# Patient Record
Sex: Female | Born: 1960 | Race: White | Hispanic: No | Marital: Married | Smoking: Former smoker
Health system: Southern US, Community
[De-identification: ages and names within clinical notes are randomized; demographics above are authoritative.]

## PROBLEM LIST (undated history)

## (undated) DIAGNOSIS — I341 Nonrheumatic mitral (valve) prolapse: Secondary | ICD-10-CM

## (undated) DIAGNOSIS — R112 Nausea with vomiting, unspecified: Secondary | ICD-10-CM

## (undated) DIAGNOSIS — R51 Headache: Secondary | ICD-10-CM

## (undated) DIAGNOSIS — M778 Other enthesopathies, not elsewhere classified: Secondary | ICD-10-CM

## (undated) DIAGNOSIS — Z9189 Other specified personal risk factors, not elsewhere classified: Secondary | ICD-10-CM

## (undated) DIAGNOSIS — K635 Polyp of colon: Secondary | ICD-10-CM

## (undated) DIAGNOSIS — L609 Nail disorder, unspecified: Secondary | ICD-10-CM

## (undated) DIAGNOSIS — D122 Benign neoplasm of ascending colon: Secondary | ICD-10-CM

## (undated) DIAGNOSIS — H9319 Tinnitus, unspecified ear: Secondary | ICD-10-CM

## (undated) DIAGNOSIS — Z7183 Encounter for nonprocreative genetic counseling: Secondary | ICD-10-CM

## (undated) DIAGNOSIS — L719 Rosacea, unspecified: Secondary | ICD-10-CM

## (undated) DIAGNOSIS — J439 Emphysema, unspecified: Secondary | ICD-10-CM

## (undated) DIAGNOSIS — Z1211 Encounter for screening for malignant neoplasm of colon: Secondary | ICD-10-CM

## (undated) DIAGNOSIS — J449 Chronic obstructive pulmonary disease, unspecified: Secondary | ICD-10-CM

## (undated) DIAGNOSIS — Z803 Family history of malignant neoplasm of breast: Secondary | ICD-10-CM

## (undated) DIAGNOSIS — R519 Headache, unspecified: Secondary | ICD-10-CM

## (undated) DIAGNOSIS — Z1371 Encounter for nonprocreative screening for genetic disease carrier status: Secondary | ICD-10-CM

## (undated) DIAGNOSIS — Z9889 Other specified postprocedural states: Secondary | ICD-10-CM

## (undated) DIAGNOSIS — R200 Anesthesia of skin: Secondary | ICD-10-CM

## (undated) HISTORY — DX: Encounter for screening for malignant neoplasm of colon: Z12.11

## (undated) HISTORY — DX: Nail disorder, unspecified: L60.9

## (undated) HISTORY — DX: Benign neoplasm of ascending colon: D12.2

## (undated) HISTORY — DX: Family history of malignant neoplasm of breast: Z80.3

## (undated) HISTORY — DX: Encounter for nonprocreative screening for genetic disease carrier status: Z13.71

## (undated) HISTORY — DX: Other enthesopathies, not elsewhere classified: M77.8

## (undated) HISTORY — PX: LUMBAR DISC SURGERY: SHX700

## (undated) HISTORY — DX: Polyp of colon: K63.5

## (undated) HISTORY — DX: Other specified personal risk factors, not elsewhere classified: Z91.89

## (undated) HISTORY — DX: Encounter for nonprocreative genetic counseling: Z71.83

## (undated) HISTORY — PX: BUNIONECTOMY: SHX129

---

## 2004-03-28 ENCOUNTER — Ambulatory Visit: Payer: Self-pay | Admitting: Podiatry

## 2005-09-21 ENCOUNTER — Emergency Department: Payer: Self-pay | Admitting: Emergency Medicine

## 2008-03-01 ENCOUNTER — Ambulatory Visit: Payer: Self-pay

## 2008-03-20 ENCOUNTER — Ambulatory Visit: Payer: Self-pay

## 2008-03-22 ENCOUNTER — Ambulatory Visit: Payer: Self-pay

## 2009-10-14 ENCOUNTER — Ambulatory Visit: Payer: Self-pay

## 2011-01-29 ENCOUNTER — Ambulatory Visit: Payer: Self-pay

## 2012-12-12 DIAGNOSIS — M79606 Pain in leg, unspecified: Secondary | ICD-10-CM | POA: Insufficient documentation

## 2012-12-12 DIAGNOSIS — I8390 Asymptomatic varicose veins of unspecified lower extremity: Secondary | ICD-10-CM | POA: Insufficient documentation

## 2014-06-19 ENCOUNTER — Ambulatory Visit: Payer: Self-pay | Admitting: Internal Medicine

## 2014-06-19 LAB — CBC AND DIFFERENTIAL: Hemoglobin: 14 g/dL (ref 12.0–16.0)

## 2014-06-19 LAB — TSH: TSH: 1.02 u[IU]/mL (ref <=5.90)

## 2014-06-19 LAB — BASIC METABOLIC PANEL
BUN: 13 mg/dL (ref 4–21)
Creatinine: 0.7 mg/dL (ref <=1.1)

## 2014-06-23 ENCOUNTER — Emergency Department: Payer: Self-pay | Admitting: Emergency Medicine

## 2015-03-11 ENCOUNTER — Encounter: Payer: Self-pay | Admitting: Internal Medicine

## 2015-03-11 ENCOUNTER — Other Ambulatory Visit: Payer: Self-pay | Admitting: Internal Medicine

## 2015-03-11 DIAGNOSIS — R599 Enlarged lymph nodes, unspecified: Secondary | ICD-10-CM | POA: Insufficient documentation

## 2015-03-11 DIAGNOSIS — L210 Seborrhea capitis: Secondary | ICD-10-CM | POA: Insufficient documentation

## 2015-03-11 DIAGNOSIS — F17201 Nicotine dependence, unspecified, in remission: Secondary | ICD-10-CM | POA: Insufficient documentation

## 2015-03-11 DIAGNOSIS — M778 Other enthesopathies, not elsewhere classified: Secondary | ICD-10-CM | POA: Insufficient documentation

## 2015-03-11 DIAGNOSIS — M62838 Other muscle spasm: Secondary | ICD-10-CM

## 2015-03-11 DIAGNOSIS — M25519 Pain in unspecified shoulder: Secondary | ICD-10-CM | POA: Insufficient documentation

## 2015-03-11 DIAGNOSIS — L609 Nail disorder, unspecified: Secondary | ICD-10-CM | POA: Insufficient documentation

## 2015-03-11 DIAGNOSIS — F17200 Nicotine dependence, unspecified, uncomplicated: Secondary | ICD-10-CM | POA: Insufficient documentation

## 2015-03-11 DIAGNOSIS — I8390 Asymptomatic varicose veins of unspecified lower extremity: Secondary | ICD-10-CM | POA: Insufficient documentation

## 2015-03-11 DIAGNOSIS — L719 Rosacea, unspecified: Secondary | ICD-10-CM | POA: Insufficient documentation

## 2015-03-11 DIAGNOSIS — R591 Generalized enlarged lymph nodes: Secondary | ICD-10-CM | POA: Insufficient documentation

## 2015-03-11 HISTORY — DX: Other enthesopathies, not elsewhere classified: M77.8

## 2015-03-11 HISTORY — DX: Nail disorder, unspecified: L60.9

## 2015-03-11 HISTORY — DX: Other muscle spasm: M62.838

## 2015-09-16 ENCOUNTER — Encounter: Payer: Self-pay | Admitting: Internal Medicine

## 2015-09-20 ENCOUNTER — Encounter: Payer: Self-pay | Admitting: Internal Medicine

## 2015-09-20 ENCOUNTER — Ambulatory Visit (INDEPENDENT_AMBULATORY_CARE_PROVIDER_SITE_OTHER): Payer: BLUE CROSS/BLUE SHIELD | Admitting: Internal Medicine

## 2015-09-20 VITALS — BP 128/78 | HR 76 | Temp 97.7°F | Resp 16 | Ht 70.0 in | Wt 212.0 lb

## 2015-09-20 DIAGNOSIS — Z1211 Encounter for screening for malignant neoplasm of colon: Secondary | ICD-10-CM | POA: Diagnosis not present

## 2015-09-20 DIAGNOSIS — K5900 Constipation, unspecified: Secondary | ICD-10-CM

## 2015-09-20 NOTE — Progress Notes (Signed)
    Date:  09/20/2015   Name:  Sharon [name]   DOB:  [birth year]   MRN:  [id number]   Chief Complaint: Colon Cancer Screening HPI She has never had a colonoscopy and wants to get a referral.  She has life long constipation which is unchanged.  She denies abdominal pain, rectal bleeding, change in stool caliber or family history of colon cancer.  She sees GYN for annual exam, Pap smear and mammograms.  He does not order labs.   Review of Systems  Constitutional: Negative for chills, fatigue and unexpected weight change.  Respiratory: Negative for chest tightness, shortness of breath and wheezing.   Cardiovascular: Negative for chest pain and palpitations.  Gastrointestinal: Positive for constipation. Negative for abdominal pain, blood in stool, anal bleeding and rectal pain.  Skin: Negative for color change and rash.    Patient Active Problem List   Diagnosis Date Noted  . Pain in shoulder 03/11/2015  . HLD (hyperlipidemia) 03/11/2015  . Disease of nail 03/11/2015  . Muscle spasms of neck 03/11/2015  . Tendinitis of wrist 03/11/2015  . Acne erythematosa 03/11/2015  . Seborrhea capitis 03/11/2015  . 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 Compulsive tobacco user syndrome 03/11/2015    Prior to Admission medications   Medication Sig Start Date End Date Taking? Authorizing Provider  doxycycline (VIBRAMYCIN) 100 MG capsule Take 1 capsule by mouth daily.   Yes Historical Provider, MD  Javier Docker Oil 1000 MG CAPS Take by mouth.   Yes Historical Provider, MD  loratadine (CLARITIN REDITABS) 10 MG dissolvable tablet Take 1 tablet by mouth daily.   Yes Historical Provider, MD    Allergies  Allergen Reactions  . Amoxicillin Itching    Past Surgical History  Procedure Laterality Date  . Bunionectomy Bilateral   . Lumbar disc surgery      Social History  Substance Use Topics  . Smoking status: Current Every Day Smoker -- 1.00 packs/day    Types: Cigarettes  . Smokeless tobacco: Never Used  .  Alcohol Use: 1.2 oz/week    2 Standard drinks or equivalent per week   Medication list has been reviewed and updated.  Physical Exam  Constitutional: She is oriented to person, place, and time. She appears well-developed. No distress.  HENT:  Head: Normocephalic and atraumatic.  Cardiovascular: Normal rate, regular rhythm and normal heart sounds.   Pulmonary/Chest: Effort normal and breath sounds normal. No respiratory distress.  Abdominal: Soft. Normal appearance and bowel sounds are normal. There is no hepatosplenomegaly. There is no tenderness. There is no CVA tenderness.  Musculoskeletal: Normal range of motion.  Neurological: She is alert and oriented to person, place, and time.  Skin: Skin is warm and dry. No rash noted.  Psychiatric: She has a normal mood and affect. Her speech is normal and behavior is normal. Thought content normal.  Nursing note and vitals reviewed.   BP 128/78 mmHg  Pulse 76  Temp(Src) 97.7 F (36.5 C) (Oral)  Resp 16  Ht 5\' 10"  (1.778 m)  Wt 212 lb (96.163 kg)  BMI 30.42 kg/m2  Assessment and Plan: 1. Constipation, unspecified constipation type Recommend trying a daily probiotic  2. Colon cancer screening Will refer; procedure discussed - Ambulatory referral to Gastroenterology   Halina Maidens, MD [facility]  09/20/2015

## 2015-10-03 ENCOUNTER — Telehealth: Payer: Self-pay

## 2015-10-03 ENCOUNTER — Other Ambulatory Visit: Payer: Self-pay

## 2015-10-03 NOTE — Telephone Encounter (Signed)
Gastroenterology Pre-Procedure Review  Request Date: 11/08/15 Requesting Physician: Dr. Army Melia  PATIENT REVIEW QUESTIONS: The patient responded to the following health history questions as indicated:    1. Are you having any GI issues? no 2. Do you have a personal history of Polyps? no 3. Do you have a family history of Colon Cancer or Polyps? no 4. Diabetes Mellitus? no 5. Joint replacements in the past 12 months?no 6. Major health problems in the past 3 months?no 7. Any artificial heart valves, MVP, or defibrillator?no    MEDICATIONS & ALLERGIES:    Patient reports the following regarding taking any anticoagulation/antiplatelet therapy:   Plavix, Coumadin, Eliquis, Xarelto, Lovenox, Pradaxa, Brilinta, or Effient? no Aspirin? no  Patient confirms/reports the following medications:  Current Outpatient Prescriptions  Medication Sig Dispense Refill  . doxycycline (VIBRAMYCIN) 100 MG capsule Take 1 capsule by mouth daily.    Javier Docker Oil 1000 MG CAPS Take by mouth.    . loratadine (CLARITIN REDITABS) 10 MG dissolvable tablet Take 1 tablet by mouth daily.     No current facility-administered medications for this visit.    Patient confirms/reports the following allergies:  Allergies  Allergen Reactions  . Amoxicillin Itching    No orders of the defined types were placed in this encounter.    AUTHORIZATION INFORMATION Primary Insurance: 1D#: Group #:  Secondary Insurance: 1D#: Group #:  SCHEDULE INFORMATION: Date: 11/08/15 Time: Location: [place]

## 2015-11-08 ENCOUNTER — Ambulatory Visit
Admission: RE | Admit: 2015-11-08 | Payer: BLUE CROSS/BLUE SHIELD | Source: Ambulatory Visit | Admitting: Gastroenterology

## 2015-11-08 ENCOUNTER — Encounter: Admission: RE | Payer: Self-pay | Source: Ambulatory Visit

## 2015-11-08 SURGERY — COLONOSCOPY WITH PROPOFOL
Anesthesia: Choice

## 2016-02-12 ENCOUNTER — Telehealth: Payer: Self-pay | Admitting: Gastroenterology

## 2016-02-12 NOTE — Telephone Encounter (Signed)
Patient would like to schedule her colonoscopy. She has already had the colonoscopy screening and had a colonoscopy scheduled in June, but had to cancel. Please call to reschedule.

## 2016-02-14 ENCOUNTER — Other Ambulatory Visit: Payer: Self-pay

## 2016-02-14 NOTE — Telephone Encounter (Signed)
Pt rescheduled for screening colonoscopy at [facility] on 03/12/16. Pt still has paperwork and rx from previous scheduled procedure.

## 2016-02-19 DIAGNOSIS — L708 Other acne: Secondary | ICD-10-CM | POA: Diagnosis not present

## 2016-02-19 DIAGNOSIS — D225 Melanocytic nevi of trunk: Secondary | ICD-10-CM | POA: Diagnosis not present

## 2016-02-19 DIAGNOSIS — D2362 Other benign neoplasm of skin of left upper limb, including shoulder: Secondary | ICD-10-CM | POA: Diagnosis not present

## 2016-02-19 DIAGNOSIS — L218 Other seborrheic dermatitis: Secondary | ICD-10-CM | POA: Diagnosis not present

## 2016-02-19 DIAGNOSIS — D485 Neoplasm of uncertain behavior of skin: Secondary | ICD-10-CM | POA: Diagnosis not present

## 2016-02-19 DIAGNOSIS — L728 Other follicular cysts of the skin and subcutaneous tissue: Secondary | ICD-10-CM | POA: Diagnosis not present

## 2016-02-19 DIAGNOSIS — D2372 Other benign neoplasm of skin of left lower limb, including hip: Secondary | ICD-10-CM | POA: Diagnosis not present

## 2016-02-19 DIAGNOSIS — L538 Other specified erythematous conditions: Secondary | ICD-10-CM | POA: Diagnosis not present

## 2016-03-06 ENCOUNTER — Encounter: Payer: Self-pay | Admitting: *Deleted

## 2016-03-11 NOTE — Discharge Instructions (Signed)

## 2016-03-12 ENCOUNTER — Ambulatory Visit
Admission: RE | Admit: 2016-03-12 | Discharge: 2016-03-12 | Disposition: A | Discharge status: Home / Self Care | Payer: BLUE CROSS/BLUE SHIELD | Source: Ambulatory Visit | Attending: Gastroenterology | Admitting: Gastroenterology

## 2016-03-12 ENCOUNTER — Encounter
Admission: RE | Disposition: A | Discharge status: Home / Self Care | Payer: Self-pay | Source: Ambulatory Visit | Attending: Gastroenterology

## 2016-03-12 ENCOUNTER — Ambulatory Visit: Payer: BLUE CROSS/BLUE SHIELD | Admitting: Student in an Organized Health Care Education/Training Program

## 2016-03-12 DIAGNOSIS — F17210 Nicotine dependence, cigarettes, uncomplicated: Secondary | ICD-10-CM | POA: Diagnosis not present

## 2016-03-12 DIAGNOSIS — Z1211 Encounter for screening for malignant neoplasm of colon: Secondary | ICD-10-CM

## 2016-03-12 DIAGNOSIS — D125 Benign neoplasm of sigmoid colon: Secondary | ICD-10-CM | POA: Diagnosis not present

## 2016-03-12 DIAGNOSIS — K640 First degree hemorrhoids: Secondary | ICD-10-CM | POA: Diagnosis not present

## 2016-03-12 DIAGNOSIS — K635 Polyp of colon: Secondary | ICD-10-CM

## 2016-03-12 DIAGNOSIS — Z79899 Other long term (current) drug therapy: Secondary | ICD-10-CM | POA: Insufficient documentation

## 2016-03-12 DIAGNOSIS — I341 Nonrheumatic mitral (valve) prolapse: Secondary | ICD-10-CM | POA: Diagnosis not present

## 2016-03-12 DIAGNOSIS — D122 Benign neoplasm of ascending colon: Secondary | ICD-10-CM

## 2016-03-12 HISTORY — DX: Rosacea, unspecified: L71.9

## 2016-03-12 HISTORY — DX: Headache, unspecified: R51.9

## 2016-03-12 HISTORY — DX: Nausea with vomiting, unspecified: R11.2

## 2016-03-12 HISTORY — DX: Headache: R51

## 2016-03-12 HISTORY — DX: Anesthesia of skin: R20.0

## 2016-03-12 HISTORY — DX: Nonrheumatic mitral (valve) prolapse: I34.1

## 2016-03-12 HISTORY — PX: POLYPECTOMY: SHX5525

## 2016-03-12 HISTORY — PX: COLONOSCOPY WITH PROPOFOL: SHX5780

## 2016-03-12 HISTORY — DX: Other specified postprocedural states: Z98.890

## 2016-03-12 SURGERY — COLONOSCOPY WITH PROPOFOL
Anesthesia: Monitor Anesthesia Care | Wound class: Contaminated

## 2016-03-12 MED ORDER — LACTATED RINGERS IV SOLN
INTRAVENOUS | Status: DC
  Administered 2016-03-12: 08:00:00 via INTRAVENOUS

## 2016-03-12 MED ORDER — PROPOFOL 10 MG/ML IV BOLUS
INTRAVENOUS | Status: DC | PRN
  Administered 2016-03-12 (×4): 20 mg via INTRAVENOUS
  Administered 2016-03-12: 10 mg via INTRAVENOUS
  Administered 2016-03-12: 60 mg via INTRAVENOUS
  Administered 2016-03-12: 30 mg via INTRAVENOUS

## 2016-03-12 MED ORDER — ACETAMINOPHEN 325 MG PO TABS: 325.0000 mg | ORAL_TABLET | ORAL | Status: DC | PRN

## 2016-03-12 MED ORDER — LIDOCAINE HCL (CARDIAC) 20 MG/ML IV SOLN
INTRAVENOUS | Status: DC | PRN
  Administered 2016-03-12: 40 mg via INTRAVENOUS

## 2016-03-12 MED ORDER — STERILE WATER FOR IRRIGATION IR SOLN
Status: DC | PRN
  Administered 2016-03-12: 10:00:00

## 2016-03-12 MED ORDER — ACETAMINOPHEN 160 MG/5ML PO SOLN: 325.0000 mg | ORAL | Status: DC | PRN

## 2016-03-12 SURGICAL SUPPLY — 23 items
CANISTER SUCT 1200ML W/VALVE (MISCELLANEOUS) ×3 IMPLANT
CLIP HMST 235XBRD CATH ROT (MISCELLANEOUS) IMPLANT
CLIP RESOLUTION 360 11X235 (MISCELLANEOUS)
FCP ESCP3.2XJMB 240X2.8X (MISCELLANEOUS) ×2
FORCEPS BIOP RAD 4 LRG CAP 4 (CUTTING FORCEPS) IMPLANT
FORCEPS BIOP RJ4 240 W/NDL (MISCELLANEOUS) ×3
FORCEPS ESCP3.2XJMB 240X2.8X (MISCELLANEOUS) IMPLANT
GOWN CVR UNV OPN BCK APRN NK (MISCELLANEOUS) ×4 IMPLANT
GOWN ISOL THUMB LOOP REG UNIV (MISCELLANEOUS) ×6
INJECTOR VARIJECT VIN23 (MISCELLANEOUS) IMPLANT
KIT DEFENDO VALVE AND CONN (KITS) IMPLANT
KIT ENDO PROCEDURE OLY (KITS) ×3 IMPLANT
MARKER SPOT ENDO TATTOO 5ML (MISCELLANEOUS) IMPLANT
PAD GROUND ADULT SPLIT (MISCELLANEOUS) IMPLANT
PROBE APC STR FIRE (PROBE) IMPLANT
RETRIEVER NET ROTH 2.5X230 LF (MISCELLANEOUS) IMPLANT
SNARE SHORT THROW 13M SML OVAL (MISCELLANEOUS) ×1 IMPLANT
SNARE SHORT THROW 30M LRG OVAL (MISCELLANEOUS) IMPLANT
SNARE SNG USE RND 15MM (INSTRUMENTS) IMPLANT
SPOT EX ENDOSCOPIC TATTOO (MISCELLANEOUS)
TRAP ETRAP POLY (MISCELLANEOUS) ×1 IMPLANT
VARIJECT INJECTOR VIN23 (MISCELLANEOUS)
WATER STERILE IRR 250ML POUR (IV SOLUTION) ×3 IMPLANT

## 2016-03-12 NOTE — Transfer of Care (Signed)
Immediate Anesthesia Transfer of Care Note  Patient: Sharon [name]  Procedure(s) Performed: Procedure(s): COLONOSCOPY WITH PROPOFOL (N/A)  Patient Location: PACU  Anesthesia Type: MAC  Level of Consciousness: awake, alert  and patient cooperative  Airway and Oxygen Therapy: Patient Spontanous Breathing and Patient connected to supplemental oxygen  Post-op Assessment: Post-op Vital signs reviewed, Patient's Cardiovascular Status Stable, Respiratory Function Stable, Patent Airway and No signs of Nausea or vomiting  Post-op Vital Signs: Reviewed and stable  Complications: No apparent anesthesia complications

## 2016-03-12 NOTE — H&P (Signed)
  Lucilla Lame, MD [address] [place], [place] [address] Phone: [phone] Fax : [phone]  Primary Care Physician:  Halina Maidens, MD Primary Gastroenterologist:  Dr. Allen Norris  Pre-Procedure History & Physical: HPI:  Sharon [name] is a 55 y.o. female is here for a screening colonoscopy.   Past Medical History:  Diagnosis Date  . 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  No issues  . Headache    sinus  . Numbness in feet    s/p foot and back surgery  . PONV (postoperative nausea and vomiting)   . Rosacea     Past Surgical History:  Procedure Laterality Date  . BUNIONECTOMY Bilateral   . LUMBAR DISC SURGERY      Prior to Admission medications   Medication Sig Start Date End Date Taking? Authorizing Provider  doxycycline (VIBRAMYCIN) 100 MG capsule Take 1 capsule by mouth daily.   Yes Historical Provider, MD  loratadine (CLARITIN REDITABS) 10 MG dissolvable tablet Take 1 tablet by mouth daily.   Yes Historical Provider, MD    Allergies as of 02/14/2016 - Review Complete 09/20/2015  Allergen Reaction Noted  . Amoxicillin Itching 09/20/2015    Family History  Problem Relation Age of Onset  . Breast cancer Maternal Grandmother   . Pancreatic cancer Maternal Uncle     Social History   Social History  . Marital status: Married    Spouse name: N/A  . Number of children: N/A  . Years of education: N/A   Occupational History  . Not on file.   Social History Main Topics  . Smoking status: Current Every Day Smoker    Packs/day: 1.00    Years: 40.00    Types: Cigarettes  . Smokeless tobacco: Never Used  . Alcohol use 1.2 oz/week    2 Standard drinks or equivalent per week  . Drug use: No  . Sexual activity: Not on file   Other Topics Concern  . Not on file   Social History Narrative  . No narrative on file    Review of Systems: See HPI, otherwise negative ROS  Physical Exam: BP 124/85   Pulse 84   Temp 97.5 F (36.4 C)  (Temporal)   Ht 5\' 10"  (1.778 m)   Wt 206 lb (93.4 kg)   SpO2 96%   BMI 29.56 kg/m  General:   Alert,  pleasant and cooperative in NAD Head:  Normocephalic and atraumatic. Neck:  Supple; no masses or thyromegaly. Lungs:  Clear throughout to auscultation.    Heart:  Regular rate and rhythm. Abdomen:  Soft, nontender and nondistended. Normal bowel sounds, without guarding, and without rebound.   Neurologic:  Alert and  oriented x4;  grossly normal neurologically.  Impression/Plan: Sharon [name] is now here to undergo a screening colonoscopy.  Risks, benefits, and alternatives regarding colonoscopy have been reviewed with the patient.  Questions have been answered.  All parties agreeable.

## 2016-03-12 NOTE — Anesthesia Preprocedure Evaluation (Signed)
Anesthesia Evaluation  Patient identified by MRN, date of birth, ID band Patient awake    Reviewed: Allergy & Precautions, H&P , NPO status , Patient's Chart, lab work & pertinent test results  History of Anesthesia Complications (+) PONV and history of anesthetic complications  Airway Mallampati: II  TM Distance: >3 FB Neck ROM: full    Dental no notable dental hx.    Pulmonary Current Smoker,    Pulmonary exam normal        Cardiovascular Normal cardiovascular exam     Neuro/Psych    GI/Hepatic   Endo/Other    Renal/GU      Musculoskeletal   Abdominal   Peds  Hematology   Anesthesia Other Findings   Reproductive/Obstetrics                             Anesthesia Physical Anesthesia Plan  ASA: II  Anesthesia Plan: MAC   Post-op Pain Management:    Induction:   Airway Management Planned:   Additional Equipment:   Intra-op Plan:   Post-operative Plan:   Informed Consent: I have reviewed the patients History and Physical, chart, labs and discussed the procedure including the risks, benefits and alternatives for the proposed anesthesia with the patient or authorized representative who has indicated his/her understanding and acceptance.     Plan Discussed with:   Anesthesia Plan Comments:         Anesthesia Quick Evaluation

## 2016-03-12 NOTE — Anesthesia Postprocedure Evaluation (Signed)
Anesthesia Post Note  Patient: Sharon [name]  Procedure(s) Performed: Procedure(s) (LRB): COLONOSCOPY WITH PROPOFOL (N/A) POLYPECTOMY  Patient location during evaluation: PACU Anesthesia Type: MAC Level of consciousness: awake and alert and oriented Pain management: satisfactory to patient Vital Signs Assessment: post-procedure vital signs reviewed and stable Respiratory status: spontaneous breathing, nonlabored ventilation and respiratory function stable Cardiovascular status: blood pressure returned to baseline and stable Postop Assessment: Adequate PO intake and No signs of nausea or vomiting Anesthetic complications: no    Raliegh Ip

## 2016-03-12 NOTE — Anesthesia Procedure Notes (Signed)
Procedure Name: MAC Performed by: Macio Kissoon Pre-anesthesia Checklist: Patient identified, Emergency Drugs available, Suction available, Timeout performed and Patient being monitored Patient Re-evaluated:Patient Re-evaluated prior to inductionOxygen Delivery Method: Nasal cannula Placement Confirmation: positive ETCO2       

## 2016-03-12 NOTE — Op Note (Signed)
[facility] Gastroenterology Patient Name: Sharon [name] Procedure Date: 03/12/2016 9:20 AM MRN: [id number] Account #: [id number] Date of Birth: 1960-06-17 Admit Type: Outpatient Age: 55 Room: [facility] OR ROOM 01 Gender: Female Note Status: Finalized Procedure:            Colonoscopy Indications:          Screening for colorectal malignant neoplasm Providers:            Lucilla Lame MD, MD Referring MD:         Halina Maidens, MD (Referring MD) Medicines:            Propofol per Anesthesia Complications:        No immediate complications. Procedure:            Pre-Anesthesia Assessment:                       - Prior to the procedure, a History and Physical was                        performed, and patient medications and allergies were                        reviewed. The patient's tolerance of previous                        anesthesia was also reviewed. The risks and benefits of                        the procedure and the sedation options and risks were                        discussed with the patient. All questions were                        answered, and informed consent was obtained. Prior                        Anticoagulants: The patient has taken no previous                        anticoagulant or antiplatelet agents. ASA Grade                        Assessment: II - A patient with mild systemic disease.                        After reviewing the risks and benefits, the patient was                        deemed in satisfactory condition to undergo the                        procedure.                       After obtaining informed consent, the colonoscope was                        passed under direct vision. Throughout the procedure,  the patient's blood pressure, pulse, and oxygen                        saturations were monitored continuously. The was                        introduced through the anus and advanced to the the                 cecum, identified by appendiceal orifice and ileocecal                        valve. The colonoscopy was performed without                        difficulty. The patient tolerated the procedure well.                        The quality of the bowel preparation was excellent. Findings:      The perianal and digital rectal examinations were normal.      A 4 mm polyp was found in the ascending colon. The polyp was sessile.       The polyp was removed with a cold snare. Resection and retrieval were       complete.      A 3 mm polyp was found in the sigmoid colon. The polyp was sessile. The       polyp was removed with a cold biopsy forceps. Resection and retrieval       were complete.      Non-bleeding internal hemorrhoids were found during retroflexion. The       hemorrhoids were Grade I (internal hemorrhoids that do not prolapse). Impression:           - One 4 mm polyp in the ascending colon, removed with a                        cold snare. Resected and retrieved.                       - One 3 mm polyp in the sigmoid colon, removed with a                        cold biopsy forceps. Resected and retrieved.                       - Non-bleeding internal hemorrhoids. Recommendation:       - Discharge patient to home.                       - Resume previous diet.                       - Continue present medications.                       - Await pathology results.                       - Repeat colonoscopy in 5 years if polyp adenoma and 10                        years  if hyperplastic Procedure Code(s):    --- Professional ---                       [id number], Colonoscopy, flexible; with removal of tumor(s),                        polyp(s), or other lesion(s) by snare technique                       45380, 6, Colonoscopy, flexible; with biopsy, single                        or multiple Diagnosis Code(s):    --- Professional ---                       Z12.11, Encounter for screening  for malignant neoplasm                        of colon                       D12.2, Benign neoplasm of ascending colon                       D12.5, Benign neoplasm of sigmoid colon CPT copyright 2016 American Medical Association. All rights reserved. The codes documented in this report are preliminary and upon coder review may  be revised to meet current compliance requirements. Lucilla Lame MD, MD 03/12/2016 9:43:23 AM This report has been signed electronically. Number of Addenda: 0 Note Initiated On: 03/12/2016 9:20 AM Scope Withdrawal Time: 0 hours 5 minutes 35 seconds  Total Procedure Duration: 0 hours 9 minutes 54 seconds       [facility]

## 2016-03-13 ENCOUNTER — Encounter: Payer: Self-pay | Admitting: Gastroenterology

## 2016-03-16 ENCOUNTER — Encounter: Payer: Self-pay | Admitting: Gastroenterology

## 2016-05-22 ENCOUNTER — Ambulatory Visit (INDEPENDENT_AMBULATORY_CARE_PROVIDER_SITE_OTHER): Payer: BLUE CROSS/BLUE SHIELD | Admitting: Internal Medicine

## 2016-05-22 ENCOUNTER — Encounter: Payer: Self-pay | Admitting: Internal Medicine

## 2016-05-22 VITALS — BP 126/82 | HR 89 | Temp 98.2°F | Ht 70.0 in | Wt 210.0 lb

## 2016-05-22 DIAGNOSIS — J3089 Other allergic rhinitis: Secondary | ICD-10-CM | POA: Diagnosis not present

## 2016-05-22 MED ORDER — MONTELUKAST SODIUM 10 MG PO TABS: 10.0000 mg | ORAL_TABLET | Freq: Every day | ORAL | 4 refills | Status: DC

## 2016-05-22 MED ORDER — FLUTICASONE PROPIONATE 50 MCG/ACT NA SUSP: 2.0000 | Freq: Every day | NASAL | 6 refills | Status: DC

## 2016-05-22 NOTE — Progress Notes (Signed)
Date:  05/22/2016   Name:  Sharon [name]   DOB:  1960-12-13   MRN:  [id number]   Chief Complaint: Sinus Problem (Pt stated coughing, ears have pressure) Sinus Problem  This is a new problem. The current episode started 1 to 4 weeks ago. The problem has been gradually worsening since onset. There has been no fever. She is experiencing no pain. Associated symptoms include congestion, coughing, ear pain and sinus pressure. Pertinent negatives include no headaches, hoarse voice or shortness of breath. Past treatments include oral decongestants.  She is taking claritin daily and using Mucinex-DM.  She has not used flonase or singulair.   Review of Systems  HENT: Positive for congestion, ear pain and sinus pressure. Negative for hoarse voice.   Respiratory: Positive for cough. Negative for shortness of breath.   Neurological: Negative for headaches.    Patient Active Problem List   Diagnosis Date Noted  . Special screening for malignant neoplasms, colon   . Polyp of sigmoid colon   . Benign neoplasm of ascending colon   . Pain in shoulder 03/11/2015  . HLD (hyperlipidemia) 03/11/2015  . Disease of nail 03/11/2015  . Muscle spasms of neck 03/11/2015  . Tendinitis of wrist 03/11/2015  . Acne erythematosa 03/11/2015  . Seborrhea capitis 03/11/2015  . 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 Compulsive tobacco user syndrome 03/11/2015    Prior to Admission medications   Medication Sig Start Date End Date Taking? Authorizing Provider  doxycycline (VIBRAMYCIN) 100 MG capsule Take 1 capsule by mouth daily.   Yes Historical Provider, MD  loratadine (CLARITIN REDITABS) 10 MG dissolvable tablet Take 1 tablet by mouth daily.   Yes Historical Provider, MD    Allergies  Allergen Reactions  . Amoxicillin Itching    Past Surgical History:  Procedure Laterality Date  . BUNIONECTOMY Bilateral   . COLONOSCOPY WITH PROPOFOL N/A 03/12/2016   Procedure: COLONOSCOPY WITH PROPOFOL;  Surgeon: Lucilla Lame, MD;   Location: [place];  Service: Endoscopy;  Laterality: N/A;  . LUMBAR [place]    . POLYPECTOMY  03/12/2016   Procedure: POLYPECTOMY;  Surgeon: Lucilla Lame, MD;  Location: [place];  Service: Endoscopy;;    Social History  Substance Use Topics  . Smoking status: Current Every Day Smoker    Packs/day: 1.00    Years: 40.00    Types: Cigarettes  . Smokeless tobacco: Never Used  . Alcohol use 1.2 oz/week    2 Standard drinks or equivalent per week     Medication list has been reviewed and updated.   Physical Exam  Constitutional: She is oriented to person, place, and time. She appears well-developed. No distress.  HENT:  Head: Normocephalic and atraumatic.  Right Ear: Tympanic membrane and ear canal normal.  Left Ear: Tympanic membrane and ear canal normal.  Nose: Mucosal edema present. Right sinus exhibits no maxillary sinus tenderness and no frontal sinus tenderness. Left sinus exhibits no maxillary sinus tenderness and no frontal sinus tenderness.  Mouth/Throat: Posterior oropharyngeal erythema (and cobblestoning) present.  Neck: Carotid bruit is not present. No thyroid mass present.  Cardiovascular: Normal rate and regular rhythm.   Pulmonary/Chest: Effort normal and breath sounds normal. No respiratory distress. She has no wheezes. She has no rhonchi.  Musculoskeletal: Normal range of motion.  Neurological: She is alert and oriented to person, place, and time.  Skin: Skin is warm and dry. No rash noted.  Psychiatric: She has a normal mood and affect. Her behavior is normal.  Thought content normal.  Nursing note and vitals reviewed.   BP 126/82   Pulse 89   Temp 98.2 F (36.8 C)   Ht 5\' 10"  (1.778 m)   Wt 210 lb (95.3 kg)   SpO2 96%   BMI 30.13 kg/m   Assessment and Plan: 1. Environmental and seasonal allergies Continue claritin; stop Mucinex Begin singulair and Flonase - montelukast (SINGULAIR) 10 MG tablet; Take 1 tablet (10 mg total) by  mouth at bedtime.  Dispense: 30 tablet; Refill: 4 - fluticasone (FLONASE) 50 MCG/ACT nasal spray; Place 2 sprays into both nostrils daily.  Dispense: 16 g; Refill: [place], MD [facility]  05/22/2016

## 2016-09-08 DIAGNOSIS — Z9189 Other specified personal risk factors, not elsewhere classified: Secondary | ICD-10-CM

## 2016-09-08 DIAGNOSIS — Z1371 Encounter for nonprocreative screening for genetic disease carrier status: Secondary | ICD-10-CM

## 2016-09-08 HISTORY — DX: Encounter for nonprocreative screening for genetic disease carrier status: Z13.71

## 2016-09-08 HISTORY — DX: Other specified personal risk factors, not elsewhere classified: Z91.89

## 2016-09-24 ENCOUNTER — Encounter: Payer: Self-pay | Admitting: Obstetrics and Gynecology

## 2016-09-24 ENCOUNTER — Ambulatory Visit (INDEPENDENT_AMBULATORY_CARE_PROVIDER_SITE_OTHER): Payer: Managed Care, Other (non HMO) | Admitting: Obstetrics and Gynecology

## 2016-09-24 VITALS — BP 100/70 | HR 83 | Ht 70.0 in | Wt 208.0 lb

## 2016-09-24 DIAGNOSIS — Z1231 Encounter for screening mammogram for malignant neoplasm of breast: Secondary | ICD-10-CM

## 2016-09-24 DIAGNOSIS — Z803 Family history of malignant neoplasm of breast: Secondary | ICD-10-CM

## 2016-09-24 DIAGNOSIS — Z124 Encounter for screening for malignant neoplasm of cervix: Secondary | ICD-10-CM

## 2016-09-24 DIAGNOSIS — Z01419 Encounter for gynecological examination (general) (routine) without abnormal findings: Secondary | ICD-10-CM | POA: Diagnosis not present

## 2016-09-24 DIAGNOSIS — L739 Follicular disorder, unspecified: Secondary | ICD-10-CM

## 2016-09-24 DIAGNOSIS — Z1239 Encounter for other screening for malignant neoplasm of breast: Secondary | ICD-10-CM

## 2016-09-24 DIAGNOSIS — Z1321 Encounter for screening for nutritional disorder: Secondary | ICD-10-CM | POA: Diagnosis not present

## 2016-09-24 NOTE — Progress Notes (Signed)
Chief Complaint  Patient presents with  . Gynecologic Exam    HPI:      [name] is a 56 y.o. No obstetric history on file. who LMP was No LMP recorded. Patient is postmenopausal., presents today for her annual examination.  Her menses are absent. Dysmenorrhea none. She does not have intermenstrual bleeding.  She does not have vasomotor sx.  Sex activity: single partner, contraception - post menopausal status. She does not have vaginal dryness.  Pt has blocked sebaceous glands per Dr. Laurey [name] bilat labia majora. They are getting bigger and are tender if she is active and sweating.  Last Pap: May 16, 2015  Results were: no abnormalities /neg HPV DNA. Pt is used to getting yearly paps with Dr. Laurey [name].  Last mammogram: May 16, 2015  Results were: normal--routine follow-up in 12 months There is a FH of breast cancer in her MGM twice (BRCA NEG), and 2 mat great aunts. There is a FH of ovarian cancer in her mat grt aunt. The patient does do self-breast exams. Pt hasn't had MyRisk cancer genetic testing and is interested.  Colonoscopy: colonoscopy 1 years ago with abnormalities. Repeat due in 3 yrs due to polyps.   Tobacco use:1 ppd, not ready to quit.  Alcohol use: none Exercise: moderately active  She does get adequate calcium but not Vitamin D in her diet.   Past Medical History:  Diagnosis Date  . 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  No issues  . Headache    sinus  . Numbness in feet    s/p foot and back surgery  . PONV (postoperative nausea and vomiting)   . Rosacea     Past Surgical History:  Procedure Laterality Date  . BUNIONECTOMY Bilateral   . COLONOSCOPY WITH PROPOFOL N/A 03/12/2016   Procedure: COLONOSCOPY WITH PROPOFOL;  Surgeon: Lucilla Lame, MD;  Location: [place];  Service: Endoscopy;  Laterality: N/A;  . LUMBAR [place]    . POLYPECTOMY  03/12/2016   Procedure: POLYPECTOMY;  Surgeon: Lucilla Lame, MD;  Location: [place];  Service: Endoscopy;;    Family History  Problem Relation Age of Onset  . Breast cancer Maternal Grandmother 60       twice, bilat breast--88  . Pancreatic cancer Maternal Uncle 60  . Breast cancer Other   . Breast cancer Other   . Ovarian cancer Other     Social History   Social History  . Marital status: Married    Spouse name: N/A  . Number of children: N/A  . Years of education: N/A   Occupational History  . Not on file.   Social History Main Topics  . Smoking status: Current Every Day Smoker    Packs/day: 1.00    Years: 40.00    Types: Cigarettes  . Smokeless tobacco: Never Used  . Alcohol use 1.2 oz/week    2 Standard drinks or equivalent per week  . Drug use: No  . Sexual activity: Yes   Other Topics Concern  . Not on file   Social History Narrative  . No narrative on file     Current Outpatient Prescriptions:  .  doxycycline (VIBRAMYCIN) 100 MG capsule, Take 1 capsule by mouth daily., Disp: , Rfl:  .  fluticasone (FLONASE) 50 MCG/ACT nasal spray, Place 2 sprays into both nostrils daily., Disp: 16 g, Rfl: 6 .  loratadine (CLARITIN REDITABS) 10 MG dissolvable tablet, Take 1 tablet by mouth  daily., Disp: , Rfl:  .  montelukast (SINGULAIR) 10 MG tablet, Take 1 tablet (10 mg total) by mouth at bedtime., Disp: 30 tablet, Rfl: 4   ROS:  Review of Systems  Constitutional: Negative for fatigue, fever and unexpected weight change.  Respiratory: Negative for cough, shortness of breath and wheezing.   Cardiovascular: Negative for chest pain, palpitations and leg swelling.  Gastrointestinal: Negative for blood in stool, constipation, diarrhea, nausea and vomiting.  Endocrine: Negative for cold intolerance, heat intolerance and polyuria.  Genitourinary: Negative for dyspareunia, dysuria, flank pain, frequency, genital sores, hematuria, menstrual problem, pelvic pain, urgency, vaginal bleeding, vaginal discharge and vaginal pain.  Musculoskeletal:  Negative for back pain, joint swelling and myalgias.  Skin: Negative for rash.  Neurological: Negative for dizziness, syncope, light-headedness, numbness and headaches.  Hematological: Negative for adenopathy.  Psychiatric/Behavioral: Negative for agitation, confusion, sleep disturbance and suicidal ideas. The patient is not nervous/anxious.      Objective: BP 100/70   Pulse 83   Ht 5' 10"  (1.778 m)   Wt 208 lb (94.3 kg)   BMI 29.84 kg/m    Physical Exam  Constitutional: She is oriented to [name], place, and time. She appears well-developed and well-nourished.  Genitourinary: Vagina normal and uterus normal.  There is lesion on the right labia. There is no rash or tenderness on the right labia.  There is lesion on the left labia. There is no rash or tenderness on the left labia. No erythema or tenderness in the vagina. No vaginal discharge found. Right adnexum does not display mass and does not display tenderness. Left adnexum does not display mass and does not display tenderness. Cervix does not exhibit motion tenderness or polyp. Uterus is not enlarged or tender.  Genitourinary Comments: BLOCKED SEBACEOUS GLANDS BILAT LABIA MAJORA  Neck: Normal range of motion. No thyromegaly present.  Cardiovascular: Normal rate, regular rhythm and normal heart sounds.   No murmur heard. Pulmonary/Chest: Effort normal and breath sounds normal. Right breast exhibits no mass, no nipple discharge, no skin change and no tenderness. Left breast exhibits no mass, no nipple discharge, no skin change and no tenderness.  Abdominal: Soft. There is no tenderness. There is no guarding.  Musculoskeletal: Normal range of motion.  Neurological: She is alert and oriented to [name], place, and time. No cranial nerve deficit.  Psychiatric: She has a normal mood and affect. Her behavior is normal.  Vitals reviewed.   Assessment/Plan:  Encounter for annual routine gynecological examination  Screening for breast  cancer - Pt to sched mammo. - Plan: MM SCREENING BREAST TOMO BILATERAL  Family history of breast cancer - My Risk testing discussed and done today. Handout given. RTO in 6 wks for results. - Plan: MM SCREENING BREAST TOMO BILATERAL, Integrated [facility]  Encounter for vitamin deficiency screening - Plan: VITAMIN D 25 Hydroxy (Vit-D Deficiency, Fractures)  Cervical cancer screening - Plain pap per pt pref. - Plan: Pap IG (Image Guided)  Sebaceous gland disease - Several glands gently expressed by me on exam. Warm compresses to see if she can gently express others. RT labia majora has large cluster.            GYN counsel breast self exam, mammography screening, adequate intake of calcium and vitamin D, diet and exercise     F/U  Return in about 6 weeks (around 11/05/2016) for My Risk f/u.  Noelly Lasseigne B. Rodrick Payson, PA-C 09/24/2016 10:40 AM

## 2016-09-25 LAB — VITAMIN D 25 HYDROXY (VIT D DEFICIENCY, FRACTURES): Vit D, 25-Hydroxy: 9.9 ng/mL — ABNORMAL LOW (ref 30.0–100.0)

## 2016-09-27 LAB — PAP IG (IMAGE GUIDED): PAP Smear Comment: 0

## 2016-09-28 ENCOUNTER — Other Ambulatory Visit: Payer: Self-pay | Admitting: Obstetrics and Gynecology

## 2016-09-28 DIAGNOSIS — E559 Vitamin D deficiency, unspecified: Secondary | ICD-10-CM | POA: Insufficient documentation

## 2016-09-28 MED ORDER — ERGOCALCIFEROL 1.25 MG (50000 UT) PO CAPS: 50000.0000 [IU] | ORAL_CAPSULE | ORAL | 0 refills | Status: DC

## 2016-09-28 NOTE — Progress Notes (Signed)
LM for pt re: Vit D deficiency. Rx Vit D2 50,000 IU weekly for 3 months. Then change to OTC Vit D3 5000 IU daily.

## 2016-10-15 ENCOUNTER — Telehealth: Payer: Self-pay | Admitting: Obstetrics and Gynecology

## 2016-10-15 ENCOUNTER — Encounter: Payer: Self-pay | Admitting: Obstetrics and Gynecology

## 2016-10-15 DIAGNOSIS — Z803 Family history of malignant neoplasm of breast: Secondary | ICD-10-CM | POA: Insufficient documentation

## 2016-10-15 NOTE — Telephone Encounter (Signed)
Pt aware of neg My Risk results. These were already discussed with her by Stefanie Libel, due to [place] ins. Riskscore=22%. Pt sched to have mammo. She is aware of screen breast MRI recommendation, and pt is to f/u 10/18 to sched this is she desires. Cont monthly SBE, yearly CBE and mammos, and Vit D supp.

## 2016-10-22 ENCOUNTER — Ambulatory Visit
Admission: RE | Admit: 2016-10-22 | Discharge: 2016-10-22 | Disposition: A | Discharge status: Home / Self Care | Payer: Managed Care, Other (non HMO) | Source: Ambulatory Visit | Attending: Obstetrics and Gynecology | Admitting: Obstetrics and Gynecology

## 2016-10-22 DIAGNOSIS — Z1239 Encounter for other screening for malignant neoplasm of breast: Secondary | ICD-10-CM

## 2016-10-22 DIAGNOSIS — Z1231 Encounter for screening mammogram for malignant neoplasm of breast: Secondary | ICD-10-CM | POA: Insufficient documentation

## 2016-10-22 DIAGNOSIS — Z803 Family history of malignant neoplasm of breast: Secondary | ICD-10-CM

## 2016-10-23 ENCOUNTER — Encounter: Payer: Self-pay | Admitting: Obstetrics and Gynecology

## 2016-10-31 ENCOUNTER — Other Ambulatory Visit: Payer: Self-pay | Admitting: Internal Medicine

## 2016-10-31 DIAGNOSIS — J3089 Other allergic rhinitis: Secondary | ICD-10-CM

## 2016-11-01 ENCOUNTER — Encounter: Payer: Self-pay | Admitting: Internal Medicine

## 2016-12-11 ENCOUNTER — Emergency Department
Admission: EM | Admit: 2016-12-11 | Discharge: 2016-12-11 | Disposition: A | Discharge status: Home / Self Care | Payer: Managed Care, Other (non HMO) | Attending: Emergency Medicine | Admitting: Emergency Medicine

## 2016-12-11 DIAGNOSIS — Z79899 Other long term (current) drug therapy: Secondary | ICD-10-CM | POA: Insufficient documentation

## 2016-12-11 DIAGNOSIS — F17210 Nicotine dependence, cigarettes, uncomplicated: Secondary | ICD-10-CM | POA: Insufficient documentation

## 2016-12-11 DIAGNOSIS — R55 Syncope and collapse: Secondary | ICD-10-CM

## 2016-12-11 LAB — GLUCOSE, CAPILLARY: Glucose-Capillary: 108 mg/dL — ABNORMAL HIGH (ref 65–99)

## 2016-12-11 NOTE — ED Notes (Signed)
Pt does not want further workup.  EDP aware.

## 2016-12-11 NOTE — ED Notes (Signed)
Pt refusing to stay, EDP okay with pt being discharged as opposed to [facility].

## 2016-12-11 NOTE — ED Notes (Signed)
Pt discharged to home.  Discharge instructions reviewed.  Verbalized understanding.  No questions or concerns at this time.  Teach back verified.  Pt in NAD.  No items left in ED.   

## 2016-12-11 NOTE — ED Notes (Signed)
Signature pad not working, paper copy signed

## 2016-12-11 NOTE — ED Triage Notes (Signed)
Pt here with another family member in another room.  Pt walking in hallway and this RN and CT tech witnessed pt having syncopal episode in hallway.  Pt pale, gray and diaphoretic at this time.  Pt wanting to just lay down.  Pt brought into room, EKG and CBG ran at this time.  Dr. Archie Balboa at bedside and explaining pros and cons of staying and getting work up or not.  Pt does not want to stay at this time.  Pt reports that this happens when she is stressed out.

## 2016-12-11 NOTE — Discharge Instructions (Signed)
Please seek medical attention for any high fevers, chest pain, shortness of breath, change in behavior, persistent vomiting, bloody stool or any other new or concerning symptoms.  

## 2016-12-11 NOTE — ED Provider Notes (Signed)
[facility] Emergency Department Provider Note   ____________________________________________   I have reviewed the triage vital signs and the nursing notes.   HISTORY  Chief Complaint Near Syncope   History limited by: Not Limited   HPI Sharon [name] is a 56 y.o. female who was being evaluated after a syncopal episode. The patient actually had been in the emergency department already to visit her grandmother who suffered a fall and facial trauma. States that when she was in the room she started feeling lightheaded. She did try sitting down however continued to feel lightheaded so went outside to get the bathroom. While she was on her way to the bathroom she did have a syncopal episode. Denies any concurrent chest pain or palpitations. The patient states that she has had a history of syncopal episodes around medical events.   Past Medical History:  Diagnosis Date  . BRCA negative 09/2016   My Risk neg  . Encounter for nonprocreative genetic counseling   . Family history of breast cancer   . 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  No issues  . Headache    sinus  . Increased risk of breast cancer 09/2016   IBIS=17.0%/riskscore=22.4%  . Numbness in feet    s/p foot and back surgery  . PONV (postoperative nausea and vomiting)   . Rosacea     Patient Active Problem List   Diagnosis Date Noted  . Family history of breast cancer   . Vitamin D deficiency 09/28/2016  . Increased risk of breast cancer 09/08/2016  . Environmental and seasonal allergies 05/22/2016  . Special screening for malignant neoplasms, colon   . Polyp of sigmoid colon   . Benign neoplasm of ascending colon   . Pain in shoulder 03/11/2015  . HLD (hyperlipidemia) 03/11/2015  . Disease of nail 03/11/2015  . Muscle spasms of neck 03/11/2015  . Tendinitis of wrist 03/11/2015  . Acne erythematosa 03/11/2015  . Seborrhea capitis 03/11/2015  . 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  Compulsive tobacco user syndrome 03/11/2015    Past Surgical History:  Procedure Laterality Date  . BUNIONECTOMY Bilateral   . COLONOSCOPY WITH PROPOFOL N/A 03/12/2016   Procedure: COLONOSCOPY WITH PROPOFOL;  Surgeon: Lucilla Lame, MD;  Location: [place];  Service: Endoscopy;  Laterality: N/A;  . LUMBAR [place]    . POLYPECTOMY  03/12/2016   Procedure: POLYPECTOMY;  Surgeon: Lucilla Lame, MD;  Location: [place];  Service: Endoscopy;;    Prior to Admission medications   Medication Sig Start Date End Date Taking? Authorizing Provider  doxycycline (VIBRAMYCIN) 100 MG capsule Take 1 capsule by mouth daily.    [provider]  ergocalciferol (VITAMIN D2) 50000 units capsule Take 1 capsule (50,000 Units total) by mouth once a week. For 3 months 1/96/22   Copland, Alicia B, PA-C  fluticasone (FLONASE) 50 MCG/ACT nasal spray Place 2 sprays into both nostrils daily. 05/22/16   Glean Hess, MD  loratadine (CLARITIN REDITABS) 10 MG dissolvable tablet Take 1 tablet by mouth daily.    [provider]  montelukast (SINGULAIR) 10 MG tablet TAKE 1 TABLET (10 MG TOTAL) BY MOUTH AT BEDTIME. 11/01/16   Glean Hess, MD    Allergies Amoxicillin  Family History  Problem Relation Age of Onset  . Breast cancer Maternal Grandmother 60       twice, bilat breast--88  . Pancreatic cancer Maternal Uncle 60  . Breast cancer Other   . Breast  cancer Other   . Ovarian cancer Other     Social History Social History  Substance Use Topics  . Smoking status: Current Every Day Smoker    Packs/day: 1.00    Years: 40.00    Types: Cigarettes  . Smokeless tobacco: Never Used  . Alcohol use 1.2 oz/week    2 Standard drinks or equivalent per week    Review of Systems Constitutional: No fever/chills Cardiovascular: Denies chest pain. Respiratory: Denies shortness of breath. Gastrointestinal: No abdominal pain.  No nausea, no vomiting.  No diarrhea.    Neurological: Negative for headaches  ____________________________________________   PHYSICAL EXAM:  VITAL SIGNS: ED Triage Vitals [12/11/16 2051]  Enc Vitals Group     BP (!) 97/59     Pulse Rate 76     Resp 18     Temp 98.6 F (37 C)     Temp Source Oral     SpO2 97 %     Weight 208 lb (94.3 kg)   Constitutional: Alert and oriented. Well appearing and in no distress. Eyes: Conjunctivae are normal.  ENT   Head: Normocephalic and atraumatic.   Nose: No congestion/rhinnorhea.   Mouth/Throat: Mucous membranes are moist.   Neck: No stridor. Hematological/Lymphatic/Immunilogical: No cervical lymphadenopathy. Cardiovascular: Normal rate, regular rhythm.  No murmurs, rubs, or gallops.  Respiratory: Normal respiratory effort without tachypnea nor retractions. Breath sounds are clear and equal bilaterally. No wheezes/rales/rhonchi. Gastrointestinal: Soft and non tender. No rebound. No guarding.  Genitourinary: Deferred Musculoskeletal: Normal range of motion in all extremities. No lower extremity edema. Neurologic:  Normal speech and language. No gross focal neurologic deficits are appreciated.  Skin:  Skin is warm, dry and intact. No rash noted. Psychiatric: Mood and affect are normal. Speech and behavior are normal. Patient exhibits appropriate insight and judgment.  ____________________________________________    LABS (pertinent positives/negatives)  Labs Reviewed  GLUCOSE, CAPILLARY - Abnormal; Notable for the following:       Result Value   Glucose-Capillary 108 (*)    All other components within normal limits  CBG MONITORING, ED     ____________________________________________   EKG  I, Nance Pear, attending physician, personally viewed and interpreted this EKG  EKG Time: 2043 Rate: 76 Rhythm: sinus rhythm Axis: left axis deviation Intervals: qtc 474 QRS: RBBB ST changes: no st elevation Impression: abnormal  ekg   ____________________________________________    RADIOLOGY  None  ____________________________________________   PROCEDURES  Procedures  ____________________________________________   INITIAL IMPRESSION / ASSESSMENT AND PLAN / ED COURSE  Pertinent labs & imaging results that were available during my care of the patient were reviewed by me and considered in my medical decision making (see chart for details).  Patient presented to the emergency department today after syncopal episode that occurred while she was visiting her family member here in the emergency department. Patient has a history of passing out and she is around blood once you've had medical issues. Patient did not have any chest pain or palpitations. This point I think very likely patient suffered a vasovagal episode. Patient did not want any workup. I think this is reasonable. I have low suspicion for a more serious cause of syncope. 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  ____________________________________________   FINAL CLINICAL IMPRESSION(S) / ED DIAGNOSES  Final diagnoses:  Syncope, unspecified syncope type     Note: This dictation was prepared with Dragon dictation. Any transcriptional errors that result  from this process are unintentional     Nance Pear, MD 12/11/16 2132

## 2016-12-12 ENCOUNTER — Other Ambulatory Visit: Payer: Self-pay | Admitting: Obstetrics and Gynecology

## 2017-09-03 ENCOUNTER — Other Ambulatory Visit: Payer: Self-pay | Admitting: Internal Medicine

## 2017-09-03 ENCOUNTER — Telehealth: Payer: Self-pay

## 2017-09-03 DIAGNOSIS — J3089 Other allergic rhinitis: Secondary | ICD-10-CM

## 2017-09-03 MED ORDER — MONTELUKAST SODIUM 10 MG PO TABS: 10.0000 mg | ORAL_TABLET | Freq: Every day | ORAL | 1 refills | Status: DC

## 2017-09-03 MED ORDER — FLUTICASONE PROPIONATE 50 MCG/ACT NA SUSP: 2.0000 | Freq: Every day | NASAL | 1 refills | Status: DC

## 2017-09-03 NOTE — Telephone Encounter (Signed)
Allergy med refills requested as they expired.

## 2017-09-07 DIAGNOSIS — H40003 Preglaucoma, unspecified, bilateral: Secondary | ICD-10-CM | POA: Diagnosis not present

## 2017-10-26 ENCOUNTER — Other Ambulatory Visit: Payer: Self-pay | Admitting: Internal Medicine

## 2017-10-26 DIAGNOSIS — J3089 Other allergic rhinitis: Secondary | ICD-10-CM

## 2017-11-04 ENCOUNTER — Ambulatory Visit: Payer: BLUE CROSS/BLUE SHIELD | Admitting: Internal Medicine

## 2017-11-04 ENCOUNTER — Encounter: Payer: Self-pay | Admitting: Internal Medicine

## 2017-11-04 DIAGNOSIS — J3089 Other allergic rhinitis: Secondary | ICD-10-CM | POA: Diagnosis not present

## 2017-11-04 MED ORDER — MONTELUKAST SODIUM 10 MG PO TABS: 10.0000 mg | ORAL_TABLET | Freq: Every day | ORAL | 3 refills | Status: DC

## 2017-11-04 MED ORDER — FLUTICASONE PROPIONATE 50 MCG/ACT NA SUSP: 2.0000 | Freq: Every day | NASAL | 3 refills | Status: DC

## 2017-11-04 NOTE — Progress Notes (Signed)
Date:  11/04/2017   Name:  Sharon [name]   DOB:  [birth year]   MRN:  [id number]   Chief Complaint: Follow-up (seasonal allergies medication ) Doing well on singulair, claritin and flonase.  She takes meds year around and is due for refills. She has sinus congestion, no ear pain, sore throat or trouble swallowing.  No eye sx.  She has a GYN for Pap and Mammogram but does not get bloodwork done.  We discussed returning for CPX and labs.  Review of Systems  Constitutional: Negative for chills, fatigue and fever.  HENT: Positive for congestion, postnasal drip and sinus pressure. Negative for ear pain, sore throat, tinnitus, trouble swallowing and voice change.   Respiratory: Negative for chest tightness and shortness of breath.   Cardiovascular: Negative for chest pain and palpitations.  Skin: Negative for rash.  Allergic/Immunologic: Positive for environmental allergies.  Neurological: Negative for dizziness and headaches.    Patient Active Problem List   Diagnosis Date Noted  . Family history of breast cancer   . Vitamin D deficiency 09/28/2016  . Increased risk of breast cancer 09/08/2016  . Environmental and seasonal allergies 05/22/2016  . Special screening for malignant neoplasms, colon   . Polyp of sigmoid colon   . Benign neoplasm of ascending colon   . Pain in shoulder 03/11/2015  . HLD (hyperlipidemia) 03/11/2015  . Disease of nail 03/11/2015  . Muscle spasms of neck 03/11/2015  . Tendinitis of wrist 03/11/2015  . Acne erythematosa 03/11/2015  . Seborrhea capitis 03/11/2015  . 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 Compulsive tobacco user syndrome 03/11/2015    Prior to Admission medications   Medication Sig Start Date End Date Taking? Authorizing Provider  doxycycline (VIBRAMYCIN) 100 MG capsule Take 1 capsule by mouth daily.   Yes [provider]  ergocalciferol (VITAMIN D2) 50000 units capsule Take 1 capsule (50,000 Units total) by mouth once a week. For 3 months  1/32/44  Yes Copland, Alicia B, PA-C  fluticasone (FLONASE) 50 MCG/ACT nasal spray Place 2 sprays into both nostrils daily. 09/03/17  Yes Glean Hess, MD  loratadine (CLARITIN REDITABS) 10 MG dissolvable tablet Take 1 tablet by mouth daily.   Yes [provider]  montelukast (SINGULAIR) 10 MG tablet TAKE 1 TABLET BY MOUTH EVERYDAY AT BEDTIME 10/26/17  Yes Glean Hess, MD    Allergies  Allergen Reactions  . Amoxicillin Itching    Past Surgical History:  Procedure Laterality Date  . BUNIONECTOMY Bilateral   . COLONOSCOPY WITH PROPOFOL N/A 03/12/2016   Procedure: COLONOSCOPY WITH PROPOFOL;  Surgeon: Lucilla Lame, MD;  Location: [place];  Service: Endoscopy;  Laterality: N/A;  . LUMBAR [place]    . POLYPECTOMY  03/12/2016   Procedure: POLYPECTOMY;  Surgeon: Lucilla Lame, MD;  Location: [place];  Service: Endoscopy;;    Social History   Tobacco Use  . Smoking status: Current Every Day Smoker    Packs/day: 1.00    Years: 40.00    Pack years: 40.00    Types: Cigarettes  . Smokeless tobacco: Never Used  Substance Use Topics  . Alcohol use: Yes    Alcohol/week: 1.2 oz    Types: 2 Standard drinks or equivalent per week  . Drug use: No     Medication list has been reviewed and updated.  Current Meds  Medication Sig  . doxycycline (VIBRAMYCIN) 100 MG capsule Take 1 capsule by mouth daily.  . ergocalciferol (VITAMIN D2) 50000 units capsule Take  1 capsule (50,000 Units total) by mouth once a week. For 3 months  . fluticasone (FLONASE) 50 MCG/ACT nasal spray Place 2 sprays into both nostrils daily.  Marland Kitchen loratadine (CLARITIN REDITABS) 10 MG dissolvable tablet Take 1 tablet by mouth daily.  . montelukast (SINGULAIR) 10 MG tablet TAKE 1 TABLET BY MOUTH EVERYDAY AT BEDTIME    PHQ 2/9 Scores 11/04/2017 09/20/2015  PHQ - 2 Score 0 0    Physical Exam  Constitutional: She is oriented to person, place, and time. She appears well-developed. No  distress.  HENT:  Head: Normocephalic and atraumatic.  Right Ear: Tympanic membrane and ear canal normal.  Left Ear: Tympanic membrane and ear canal normal.  Nose: Right sinus exhibits no maxillary sinus tenderness and no frontal sinus tenderness. Left sinus exhibits no maxillary sinus tenderness and no frontal sinus tenderness.  Mouth/Throat: Oropharynx is clear and moist.  Cardiovascular: Normal rate, regular rhythm and normal heart sounds.  Pulmonary/Chest: Effort normal and breath sounds normal. No respiratory distress. She has no wheezes.  Musculoskeletal: Normal range of motion.  Neurological: She is alert and oriented to person, place, and time.  Skin: Skin is warm and dry. No rash noted.  Psychiatric: She has a normal mood and affect. Her behavior is normal. Thought content normal.  Nursing note and vitals reviewed.   BP 112/84   Pulse 73   Temp 98.1 F (36.7 C) (Oral)   Resp 16   Ht 5\' 10"  (1.778 m)   Wt 205 lb (93 kg)   SpO2 99%   BMI 29.41 kg/m   Assessment and Plan: 1. Environmental and seasonal allergies Continue current regimen - montelukast (SINGULAIR) 10 MG tablet; Take 1 tablet (10 mg total) by mouth at bedtime.  Dispense: 90 tablet; Refill: 3 - fluticasone (FLONASE) 50 MCG/ACT nasal spray; Place 2 sprays into both nostrils daily.  Dispense: 48 g; Refill: 3   Meds ordered this encounter  Medications  . montelukast (SINGULAIR) 10 MG tablet    Sig: Take 1 tablet (10 mg total) by mouth at bedtime.    Dispense:  90 tablet    Refill:  3  . fluticasone (FLONASE) 50 MCG/ACT nasal spray    Sig: Place 2 sprays into both nostrils daily.    Dispense:  48 g    Refill:  3   Pt will schedule CPX in the future to have labs and any other HM issues.  Partially dictated using Editor, commissioning. Any errors are unintentional.  Halina Maidens, MD [facility]  11/04/2017   There are no diagnoses linked to this encounter.

## 2017-12-13 ENCOUNTER — Other Ambulatory Visit: Payer: Self-pay | Admitting: Internal Medicine

## 2017-12-13 DIAGNOSIS — Z1231 Encounter for screening mammogram for malignant neoplasm of breast: Secondary | ICD-10-CM

## 2017-12-13 DIAGNOSIS — M2041 Other hammer toe(s) (acquired), right foot: Secondary | ICD-10-CM | POA: Diagnosis not present

## 2017-12-13 DIAGNOSIS — M779 Enthesopathy, unspecified: Secondary | ICD-10-CM | POA: Diagnosis not present

## 2017-12-13 DIAGNOSIS — M79671 Pain in right foot: Secondary | ICD-10-CM | POA: Diagnosis not present

## 2017-12-20 ENCOUNTER — Ambulatory Visit
Admission: RE | Admit: 2017-12-20 | Discharge: 2017-12-20 | Disposition: A | Discharge status: Home / Self Care | Payer: BLUE CROSS/BLUE SHIELD | Source: Ambulatory Visit | Attending: Internal Medicine | Admitting: Internal Medicine

## 2017-12-20 DIAGNOSIS — Z1231 Encounter for screening mammogram for malignant neoplasm of breast: Secondary | ICD-10-CM | POA: Insufficient documentation

## 2018-03-11 ENCOUNTER — Encounter: Payer: Self-pay | Admitting: Internal Medicine

## 2018-03-11 ENCOUNTER — Ambulatory Visit (INDEPENDENT_AMBULATORY_CARE_PROVIDER_SITE_OTHER): Payer: BLUE CROSS/BLUE SHIELD | Admitting: Internal Medicine

## 2018-03-11 VITALS — BP 104/62 | HR 78 | Ht 70.0 in | Wt 210.0 lb

## 2018-03-11 DIAGNOSIS — Z0000 Encounter for general adult medical examination without abnormal findings: Secondary | ICD-10-CM | POA: Diagnosis not present

## 2018-03-11 DIAGNOSIS — Z23 Encounter for immunization: Secondary | ICD-10-CM | POA: Diagnosis not present

## 2018-03-11 DIAGNOSIS — Z1231 Encounter for screening mammogram for malignant neoplasm of breast: Secondary | ICD-10-CM | POA: Diagnosis not present

## 2018-03-11 DIAGNOSIS — F17200 Nicotine dependence, unspecified, uncomplicated: Secondary | ICD-10-CM | POA: Diagnosis not present

## 2018-03-11 DIAGNOSIS — J3089 Other allergic rhinitis: Secondary | ICD-10-CM | POA: Diagnosis not present

## 2018-03-11 DIAGNOSIS — Z1159 Encounter for screening for other viral diseases: Secondary | ICD-10-CM

## 2018-03-11 DIAGNOSIS — E782 Mixed hyperlipidemia: Secondary | ICD-10-CM | POA: Insufficient documentation

## 2018-03-11 DIAGNOSIS — R918 Other nonspecific abnormal finding of lung field: Secondary | ICD-10-CM | POA: Insufficient documentation

## 2018-03-11 DIAGNOSIS — Z87898 Personal history of other specified conditions: Secondary | ICD-10-CM

## 2018-03-11 DIAGNOSIS — R911 Solitary pulmonary nodule: Secondary | ICD-10-CM | POA: Insufficient documentation

## 2018-03-11 LAB — POCT URINALYSIS DIPSTICK
Bilirubin, UA: NEGATIVE
Blood, UA: NEGATIVE
Glucose, UA: NEGATIVE
Ketones, UA: NEGATIVE
LEUKOCYTES UA: NEGATIVE
NITRITE UA: NEGATIVE
PROTEIN UA: NEGATIVE
Spec Grav, UA: 1.015 (ref 1.010–1.025)
Urobilinogen, UA: 0.2 E.U./dL
pH, UA: 6 (ref 5.0–8.0)

## 2018-03-11 NOTE — Patient Instructions (Signed)
Pneumococcal Conjugate Vaccine (PCV13) What You Need to Know 1. Why get vaccinated? Vaccination can protect both children and adults from pneumococcal disease. Pneumococcal disease is caused by bacteria that can spread from person to person through close contact. It can cause ear infections, and it can also lead to more serious infections of the:  Lungs (pneumonia),  Blood (bacteremia), and  Covering of the brain and spinal cord (meningitis).  Pneumococcal pneumonia is most common among adults. Pneumococcal meningitis can cause deafness and brain damage, and it kills about 1 child in 10 who get it. Anyone can get pneumococcal disease, but children under 2 years of age and adults 65 years and older, people with certain medical conditions, and cigarette smokers are at the highest risk. Before there was a vaccine, the [place] saw:  more than 700 cases of meningitis,  about 13,000 blood infections,  about 5 million ear infections, and  about 200 deaths  in children under 5 each year from pneumococcal disease. Since vaccine became available, severe pneumococcal disease in these children has fallen by 88%. About 18,000 older adults die of pneumococcal disease each year in the [place]. Treatment of pneumococcal infections with penicillin and other drugs is not as effective as it used to be, because some strains of the disease have become resistant to these drugs. This makes prevention of the disease, through vaccination, even more important. 2. PCV13 vaccine Pneumococcal conjugate vaccine (called PCV13) protects against 13 types of pneumococcal bacteria. PCV13 is routinely given to children at 2, 4, 6, and 12-15 months of age. It is also recommended for children and adults 2 to 64 years of age with certain health conditions, and for all adults 65 years of age and older. Your doctor can give you details. 3. Some people should not get this vaccine Anyone who has ever had a  life-threatening allergic reaction to a dose of this vaccine, to an earlier pneumococcal vaccine called PCV7, or to any vaccine containing diphtheria toxoid (for example, DTaP), should not get PCV13. Anyone with a severe allergy to any component of PCV13 should not get the vaccine. Tell your doctor if the person being vaccinated has any severe allergies. If the person scheduled for vaccination is not feeling well, your healthcare provider might decide to reschedule the shot on another day. 4. Risks of a vaccine reaction With any medicine, including vaccines, there is a chance of reactions. These are usually mild and go away on their own, but serious reactions are also possible. Problems reported following PCV13 varied by age and dose in the series. The most common problems reported among children were:  About half became drowsy after the shot, had a temporary loss of appetite, or had redness or tenderness where the shot was given.  About 1 out of 3 had swelling where the shot was given.  About 1 out of 3 had a mild fever, and about 1 in 20 had a fever over 102.2F.  Up to about 8 out of 10 became fussy or irritable.  Adults have reported pain, redness, and swelling where the shot was given; also mild fever, fatigue, headache, chills, or muscle pain. Young children who get PCV13 along with inactivated flu vaccine at the same time may be at increased risk for seizures caused by fever. Ask your doctor for more information. Problems that could happen after any vaccine:  People sometimes faint after a medical procedure, including vaccination. Sitting or lying down for about 15 minutes can help prevent   fainting, and injuries caused by a fall. Tell your doctor if you feel dizzy, or have vision changes or ringing in the ears.  Some older children and adults get severe pain in the shoulder and have difficulty moving the arm where a shot was given. This happens very rarely.  Any medication can cause a  severe allergic reaction. Such reactions from a vaccine are very rare, estimated at about 1 in a million doses, and would happen within a few minutes to a few hours after the vaccination. As with any medicine, there is a very small chance of a vaccine causing a serious injury or death. The safety of vaccines is always being monitored. For more information, visit: www.cdc.gov/vaccinesafety/ 5. What if there is a serious reaction? What should I look for? Look for anything that concerns you, such as signs of a severe allergic reaction, very high fever, or unusual behavior. Signs of a severe allergic reaction can include hives, swelling of the face and throat, difficulty breathing, a fast heartbeat, dizziness, and weakness-usually within a few minutes to a few hours after the vaccination. What should I do?  If you think it is a severe allergic reaction or other emergency that can't wait, call 9-1-1 or get the person to the nearest hospital. Otherwise, call your doctor.  Reactions should be reported to the Vaccine Adverse Event Reporting System (VAERS). Your doctor should file this report, or you can do it yourself through the VAERS web site at www.vaers.hhs.gov, or by calling 1-800-822-7967. ? VAERS does not give medical advice. 6. The National Vaccine Injury Compensation Program The National Vaccine Injury Compensation Program (VICP) is a federal program that was created to compensate people who may have been injured by certain vaccines. Persons who believe they may have been injured by a vaccine can learn about the program and about filing a claim by calling 1-800-338-2382 or visiting the VICP website at www.hrsa.gov/vaccinecompensation. There is a time limit to file a claim for compensation. 7. How can I learn more?  Ask your healthcare provider. He or she can give you the vaccine package insert or suggest other sources of information.  Call your local or state health department.  Contact the  Centers for Disease Control and Prevention (CDC): ? Call 1-800-232-4636 (1-800-CDC-INFO) or ? Visit CDC's website at www.cdc.gov/vaccines Vaccine Information Statement, PCV13 Vaccine (03/15/2014) This information is not intended to replace advice given to you by your health care provider. Make sure you discuss any questions you have with your health care provider. Document Released: 02/22/2006 Document Revised: 01/16/2016 Document Reviewed: 01/16/2016 Elsevier Interactive Patient Education  2017 Elsevier Inc.  

## 2018-03-11 NOTE — Progress Notes (Signed)
Date:  03/11/2018   Name:  Sharon [name]   DOB:  10-20-1960   MRN:  [id number]   Chief Complaint: Annual Exam (Breast Exam. Reg dose flu shot.) Sharon [name] is a 57 y.o. female who presents today for her Complete Annual Exam. She feels well. She reports exercising rarely. She reports she is sleeping fairly well. Mammogram was done in August.  Pap is due in 2 years.  Colonoscopy is due next year.  Nicotine Dependence  Presents for initial visit. Symptoms are negative for fatigue. (Pt found a CT report from 2016 showing bilateral 4 mm nodules in lung apex.  She was never told about this and is concerned.) Preferred tobacco types include cigarettes. Her urge triggers include company of smokers. She smokes 1 pack of cigarettes per day. Abria is ready to quit.  CT 06/23/14 at [facility]: 3. 4 mm right lung apex and 4 mm left lung apex pulmonary nodule. If  the patient is at high risk for bronchogenic carcinoma, follow-up  chest CT at 1 year is recommended. If the patient is at low risk, no  follow-up is needed.  Seasonal Allergies - she continues on daily claritin, flonase and singulair with good control of sx.  No recent sinus infections.   Review of Systems  Constitutional: Negative for chills, fatigue, fever and unexpected weight change.  HENT: Negative for congestion, hearing loss, tinnitus, trouble swallowing and voice change.   Eyes: Negative for visual disturbance.  Respiratory: Negative for cough, chest tightness, shortness of breath and wheezing.   Cardiovascular: Negative for chest pain, palpitations and leg swelling.  Gastrointestinal: Negative for abdominal pain, constipation, diarrhea and vomiting.  Endocrine: Negative for polydipsia and polyuria.  Genitourinary: Negative for dysuria, frequency, genital sores, vaginal bleeding and vaginal discharge.  Musculoskeletal: Negative for arthralgias, gait problem and joint swelling.  Skin: Negative for color change and rash.  Neurological:  Negative for dizziness, tremors, light-headedness and headaches.  Hematological: Negative for adenopathy. Does not bruise/bleed easily.  Psychiatric/Behavioral: Negative for dysphoric mood and sleep disturbance. The patient is not nervous/anxious.     Patient Active Problem List   Diagnosis Date Noted  . Family history of breast cancer   . Vitamin D deficiency 09/28/2016  . Increased risk of breast cancer 09/08/2016  . Environmental and seasonal allergies 05/22/2016  . Polyp of sigmoid colon   . Pain in shoulder 03/11/2015  . HLD (hyperlipidemia) 03/11/2015  . Muscle spasms of neck 03/11/2015  . Acne erythematosa 03/11/2015  . Seborrhea capitis 03/11/2015  . 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 Compulsive tobacco user syndrome 03/11/2015    Allergies  Allergen Reactions  . Amoxicillin Itching    Past Surgical History:  Procedure Laterality Date  . BUNIONECTOMY Bilateral   . COLONOSCOPY WITH PROPOFOL N/A 03/12/2016   Procedure: COLONOSCOPY WITH PROPOFOL;  Surgeon: Lucilla Lame, MD;  Location: [place];  Service: Endoscopy;  Laterality: N/A;  . LUMBAR [place]    . POLYPECTOMY  03/12/2016   Procedure: POLYPECTOMY;  Surgeon: Lucilla Lame, MD;  Location: [place];  Service: Endoscopy;;    Social History   Tobacco Use  . Smoking status: Current Every Day Smoker    Packs/day: 1.00    Years: 40.00    Pack years: 40.00    Types: Cigarettes  . Smokeless tobacco: Never Used  Substance Use Topics  . Alcohol use: Yes    Alcohol/week: 2.0 standard drinks    Types: 2 Standard drinks or equivalent per week  .  Drug use: No     Medication list has been reviewed and updated.  Current Meds  Medication Sig  . celecoxib (CELEBREX) 200 MG capsule TAKE 1 CAPSULE (200 MG TOTAL) BY MOUTH ONCE DAILY  . doxycycline (VIBRAMYCIN) 100 MG capsule Take 1 capsule by mouth daily.  . ergocalciferol (VITAMIN D2) 50000 units capsule Take 1 capsule (50,000 Units total) by mouth once  a week. For 3 months  . fluticasone (FLONASE) 50 MCG/ACT nasal spray Place 2 sprays into both nostrils daily.  Marland Kitchen loratadine (CLARITIN REDITABS) 10 MG dissolvable tablet Take 1 tablet by mouth daily.  . montelukast (SINGULAIR) 10 MG tablet Take 1 tablet (10 mg total) by mouth at bedtime.    PHQ 2/9 Scores 03/11/2018 11/04/2017 09/20/2015  PHQ - 2 Score 0 0 0    Physical Exam  Constitutional: She is oriented to person, place, and time. She appears well-developed and well-nourished. No distress.  HENT:  Head: Normocephalic and atraumatic.  Right Ear: Tympanic membrane and ear canal normal.  Left Ear: Tympanic membrane and ear canal normal.  Nose: Right sinus exhibits no maxillary sinus tenderness. Left sinus exhibits no maxillary sinus tenderness.  Mouth/Throat: Uvula is midline and oropharynx is clear and moist.  Eyes: Conjunctivae and EOM are normal. Right eye exhibits no discharge. Left eye exhibits no discharge. No scleral icterus.  Neck: Normal range of motion. Carotid bruit is not present. No erythema present. No thyromegaly present.  Cardiovascular: Normal rate, regular rhythm, normal heart sounds and normal pulses.  Pulmonary/Chest: Effort normal. No respiratory distress. She has no wheezes. Right breast exhibits no mass, no nipple discharge, no skin change and no tenderness. Left breast exhibits no mass, no nipple discharge, no skin change and no tenderness.  Abdominal: Soft. Bowel sounds are normal. There is no hepatosplenomegaly. There is no tenderness. There is no CVA tenderness.  Musculoskeletal: Normal range of motion.  Lymphadenopathy:    She has no cervical adenopathy.    She has no axillary adenopathy.  Neurological: She is alert and oriented to person, place, and time. She has normal reflexes. No cranial nerve deficit or sensory deficit.  Skin: Skin is warm, dry and intact. No rash noted.  Psychiatric: She has a normal mood and affect. Her speech is normal and behavior is  normal. Thought content normal.  Nursing note and vitals reviewed.   BP 104/62 (BP Location: Right Arm, Patient Position: Sitting, Cuff Size: Large)   Pulse 78   Ht 5\' 10"  (1.778 m)   Wt 210 lb (95.3 kg)   SpO2 95%   BMI 30.13 kg/m   Assessment and Plan: 1. Annual physical exam Recommend regular exercise - CBC with Differential/Platelet - Comprehensive metabolic panel - TSH - POCT urinalysis dipstick  2. Encounter for screening mammogram for breast cancer Schedule next August - MM 3D SCREEN BREAST BILATERAL; Future  3. Compulsive tobacco user syndrome Work on quitting - options discussed  4. Environmental and seasonal allergies Continue current regimen  5. Mixed hyperlipidemia - Lipid panel  6. Need for hepatitis C screening test - Hepatitis C antibody  7. H/O multiple pulmonary nodules - CT Chest Wo Contrast; Future  8. Need for vaccination for pneumococcus - Pneumococcal conjugate vaccine 13-valent IM PPV-23 next year  9. Abnormal findings on diagnostic imaging of lung - CT Chest Wo Contrast; Future   Partially dictated using Dragon software. Any errors are unintentional.  Halina Maidens, MD [facility]  03/11/2018

## 2018-03-12 LAB — CBC WITH DIFFERENTIAL/PLATELET
BASOS ABS: 0.1 10*3/uL (ref 0.0–0.2)
Basos: 1 %
EOS (ABSOLUTE): 0.3 10*3/uL (ref 0.0–0.4)
Eos: 3 %
HEMOGLOBIN: 14.2 g/dL (ref 11.1–15.9)
Hematocrit: 42.4 % (ref 34.0–46.6)
Immature Grans (Abs): 0 10*3/uL (ref 0.0–0.1)
Immature Granulocytes: 0 %
LYMPHS: 25 %
Lymphocytes Absolute: 2.7 10*3/uL (ref 0.7–3.1)
MCH: 31.2 pg (ref 26.6–33.0)
MCHC: 33.5 g/dL (ref 31.5–35.7)
MCV: 93 fL (ref 79–97)
MONOCYTES: 5 %
Monocytes Absolute: 0.6 10*3/uL (ref 0.1–0.9)
NEUTROS ABS: 6.9 10*3/uL (ref 1.4–7.0)
NEUTROS PCT: 66 %
PLATELETS: 339 10*3/uL (ref 150–450)
RBC: 4.55 x10E6/uL (ref 3.77–5.28)
RDW: 11.6 % — ABNORMAL LOW (ref 12.3–15.4)
WBC: 10.6 10*3/uL (ref 3.4–10.8)

## 2018-03-12 LAB — LIPID PANEL
CHOL/HDL RATIO: 4.2 ratio (ref 0.0–4.4)
Cholesterol, Total: 232 mg/dL — ABNORMAL HIGH (ref 100–199)
HDL: 55 mg/dL (ref >39)
LDL Calculated: 148 mg/dL — ABNORMAL HIGH (ref 0–99)
Triglycerides: 145 mg/dL (ref 0–149)
VLDL Cholesterol Cal: 29 mg/dL (ref 5–40)

## 2018-03-12 LAB — COMPREHENSIVE METABOLIC PANEL
ALBUMIN: 4.4 g/dL (ref 3.5–5.5)
ALT: 9 IU/L (ref 0–32)
AST: 9 IU/L (ref 0–40)
Albumin/Globulin Ratio: 2.3 — ABNORMAL HIGH (ref 1.2–2.2)
Alkaline Phosphatase: 127 IU/L — ABNORMAL HIGH (ref 39–117)
BILIRUBIN TOTAL: 0.3 mg/dL (ref 0.0–1.2)
BUN/Creatinine Ratio: 15 (ref 9–23)
BUN: 11 mg/dL (ref 6–24)
CHLORIDE: 105 mmol/L (ref 96–106)
CO2: 26 mmol/L (ref 20–29)
CREATININE: 0.72 mg/dL (ref 0.57–1.00)
Calcium: 9.7 mg/dL (ref 8.7–10.2)
GFR calc Af Amer: 108 mL/min/{1.73_m2} (ref >59)
GFR calc non Af Amer: 93 mL/min/{1.73_m2} (ref >59)
GLUCOSE: 94 mg/dL (ref 65–99)
Globulin, Total: 1.9 g/dL (ref 1.5–4.5)
Potassium: 4.7 mmol/L (ref 3.5–5.2)
Sodium: 144 mmol/L (ref 134–144)
Total Protein: 6.3 g/dL (ref 6.0–8.5)

## 2018-03-12 LAB — TSH: TSH: 0.959 u[IU]/mL (ref 0.450–4.500)

## 2018-03-12 LAB — HEPATITIS C ANTIBODY

## 2018-03-15 ENCOUNTER — Ambulatory Visit
Admission: RE | Admit: 2018-03-15 | Discharge: 2018-03-15 | Disposition: A | Discharge status: Home / Self Care | Payer: BLUE CROSS/BLUE SHIELD | Source: Ambulatory Visit | Attending: Internal Medicine | Admitting: Internal Medicine

## 2018-03-15 DIAGNOSIS — I700 Atherosclerosis of aorta: Secondary | ICD-10-CM | POA: Insufficient documentation

## 2018-03-15 DIAGNOSIS — J439 Emphysema, unspecified: Secondary | ICD-10-CM | POA: Diagnosis not present

## 2018-03-15 DIAGNOSIS — R918 Other nonspecific abnormal finding of lung field: Secondary | ICD-10-CM

## 2018-03-15 DIAGNOSIS — Z87898 Personal history of other specified conditions: Secondary | ICD-10-CM

## 2018-04-12 DIAGNOSIS — D485 Neoplasm of uncertain behavior of skin: Secondary | ICD-10-CM | POA: Diagnosis not present

## 2018-04-12 DIAGNOSIS — L821 Other seborrheic keratosis: Secondary | ICD-10-CM | POA: Diagnosis not present

## 2018-04-12 DIAGNOSIS — L538 Other specified erythematous conditions: Secondary | ICD-10-CM | POA: Diagnosis not present

## 2018-04-12 DIAGNOSIS — D2272 Melanocytic nevi of left lower limb, including hip: Secondary | ICD-10-CM | POA: Diagnosis not present

## 2018-04-12 DIAGNOSIS — L718 Other rosacea: Secondary | ICD-10-CM | POA: Diagnosis not present

## 2018-04-12 DIAGNOSIS — D1801 Hemangioma of skin and subcutaneous tissue: Secondary | ICD-10-CM | POA: Diagnosis not present

## 2018-04-12 DIAGNOSIS — D225 Melanocytic nevi of trunk: Secondary | ICD-10-CM | POA: Diagnosis not present

## 2018-05-18 DIAGNOSIS — L538 Other specified erythematous conditions: Secondary | ICD-10-CM | POA: Diagnosis not present

## 2018-05-18 DIAGNOSIS — D485 Neoplasm of uncertain behavior of skin: Secondary | ICD-10-CM | POA: Diagnosis not present

## 2018-05-18 DIAGNOSIS — L820 Inflamed seborrheic keratosis: Secondary | ICD-10-CM | POA: Diagnosis not present

## 2018-05-18 DIAGNOSIS — L298 Other pruritus: Secondary | ICD-10-CM | POA: Diagnosis not present

## 2018-05-18 DIAGNOSIS — D1801 Hemangioma of skin and subcutaneous tissue: Secondary | ICD-10-CM | POA: Diagnosis not present

## 2018-09-12 ENCOUNTER — Encounter: Payer: Self-pay | Admitting: Internal Medicine

## 2018-09-14 ENCOUNTER — Ambulatory Visit: Payer: BLUE CROSS/BLUE SHIELD | Admitting: Internal Medicine

## 2018-09-14 ENCOUNTER — Other Ambulatory Visit: Payer: Self-pay

## 2018-09-14 ENCOUNTER — Encounter: Payer: Self-pay | Admitting: Internal Medicine

## 2018-09-14 VITALS — BP 124/70 | HR 73 | Ht 70.0 in | Wt 206.0 lb

## 2018-09-14 DIAGNOSIS — J3089 Other allergic rhinitis: Secondary | ICD-10-CM | POA: Diagnosis not present

## 2018-09-14 DIAGNOSIS — Z87898 Personal history of other specified conditions: Secondary | ICD-10-CM | POA: Diagnosis not present

## 2018-09-14 MED ORDER — FLUTICASONE PROPIONATE 50 MCG/ACT NA SUSP: 2.0000 | Freq: Every day | NASAL | 3 refills | Status: DC

## 2018-09-14 MED ORDER — MONTELUKAST SODIUM 10 MG PO TABS: 10.0000 mg | ORAL_TABLET | Freq: Every day | ORAL | 3 refills | Status: DC

## 2018-09-14 NOTE — Progress Notes (Signed)
Date:  09/14/2018   Name:  Sharon [name]   DOB:  05-10-1961   MRN:  [id number]   Chief Complaint: Pulmonary Nodule (Follow up from November after nodules were found on Xray)  CT Chest 03/15/2018 IMPRESSION: Two apical nodules seen on prior CT scan are unchanged currently and can be considered benign at this point.  However, multiple nodules are seen throughout both lungs, with the largest measuring 6 mm in the right middle lobe. These were not included in field-of-view on prior exam. Non-contrast chest CT at 3-6 months is recommended. If the nodules are stable at time of repeat CT, then future CT at 18-24 months (from today's scan) is considered optional for low-risk patients, but is recommended for high-risk patients. This recommendation follows the consensus statement: Guidelines for Management of Incidental Pulmonary Nodules Detected on CT Images: From the Fleischner Society 2017; Radiology 2017; 284:228-243.  Aortic Atherosclerosis (ICD10-I70.0) and Emphysema (ICD10-J43.9). I recommend the Pulmonary nodule clinic to order follow up scans at appropriate intervals.  Nicotine Dependence  Presents for follow-up visit. Symptoms are negative for fatigue and sore throat. Her urge triggers include company of smokers. The symptoms have been stable. She smokes 1 pack of cigarettes per day. Compliance with prior treatments: not currently ready to quit.   Allergies - stable symptoms, taking claritin, flonase and singulair regularly.  No recent sinus or ear infections.    Review of Systems  Constitutional: Negative for chills, fatigue, fever and unexpected weight change.  HENT: Negative for congestion, sinus pressure and sore throat.   Eyes: Negative for visual disturbance.  Respiratory: Negative for cough, chest tightness, shortness of breath and wheezing.   Cardiovascular: Negative for chest pain.  Neurological: Negative for dizziness and headaches.    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   Compulsive tobacco user syndrome 03/11/2015    Allergies  Allergen Reactions   Amoxicillin Itching    Past Surgical History:  Procedure Laterality Date   BUNIONECTOMY Bilateral    COLONOSCOPY WITH PROPOFOL N/A 03/12/2016   Procedure: COLONOSCOPY WITH PROPOFOL;  Surgeon: Lucilla Lame, MD;  Location: [place];  Service: Endoscopy;  Laterality: N/A;   LUMBAR DISC SURGERY     POLYPECTOMY  03/12/2016   Procedure: POLYPECTOMY;  Surgeon: Lucilla Lame, MD;  Location: [place];  Service: Endoscopy;;    Social History   Tobacco Use   Smoking status: Current Every Day Smoker    Packs/day: 1.00    Years: 40.00    Pack years: 40.00    Types: Cigarettes   Smokeless tobacco: Never Used  Substance Use Topics   Alcohol use: Yes    Alcohol/week: 2.0 standard drinks    Types: 2 Standard drinks or equivalent per week   Drug use: No     Medication list has been reviewed and updated.  Current Meds  Medication Sig   celecoxib (CELEBREX) 200 MG capsule TAKE 1 CAPSULE (200 MG TOTAL) BY MOUTH ONCE DAILY   ergocalciferol (VITAMIN D2) 50000 units capsule Take 1 capsule (50,000  Units total) by mouth once a week. For 3 months   fluticasone (FLONASE) 50 MCG/ACT nasal spray Place 2 sprays into both nostrils daily.   loratadine (CLARITIN REDITABS) 10 MG dissolvable tablet Take 1 tablet by mouth daily.   montelukast (SINGULAIR) 10 MG tablet Take 1 tablet (10 mg total) by mouth at bedtime.    PHQ 2/9 Scores  09/14/2018 03/11/2018 11/04/2017 09/20/2015  PHQ - 2 Score 0 0 0 0    BP Readings from Last 3 Encounters:  09/14/18 124/70  03/11/18 104/62  11/04/17 112/84    Physical Exam Vitals signs and nursing note reviewed.  Constitutional:      General: She is not in acute distress.    Appearance: She is well-developed.  HENT:     Head: Normocephalic and atraumatic.  Neck:     Musculoskeletal: Normal range of motion and neck supple.     Vascular: No carotid bruit.  Cardiovascular:     Rate and Rhythm: Normal rate and regular rhythm.  Pulmonary:     Effort: Pulmonary effort is normal. No respiratory distress.     Breath sounds: Normal breath sounds. No wheezing or rhonchi.  Musculoskeletal: Normal range of motion.  Lymphadenopathy:     Cervical: No cervical adenopathy.  Skin:    General: Skin is warm and dry.     Findings: No rash.  Neurological:     Mental Status: She is alert and oriented to person, place, and time.  Psychiatric:        Behavior: Behavior normal.        Thought Content: Thought content normal.     Wt Readings from Last 3 Encounters:  09/14/18 206 lb (93.4 kg)  03/11/18 210 lb (95.3 kg)  11/04/17 205 lb (93 kg)    BP 124/70    Pulse 73    Ht 5\' 10"  (1.778 m)    Wt 206 lb (93.4 kg)    SpO2 95% Comment: Smoker   BMI 29.56 kg/m   Assessment and Plan: 1. H/O multiple pulmonary nodules Due for repeat scan now - AMB  Referral to Pulmonary Nodule Clinic  2. Environmental and seasonal allergies Continue current medications - fluticasone (FLONASE) 50 MCG/ACT nasal spray; Place 2 sprays into both nostrils daily.  Dispense: 48 g; Refill: 3 - montelukast (SINGULAIR) 10 MG tablet; Take 1 tablet (10 mg total) by mouth at bedtime.  Dispense: 90 tablet; Refill: 3   Partially dictated using Editor, commissioning. Any errors are unintentional.  Halina Maidens, MD [facility]  09/14/2018

## 2018-09-15 ENCOUNTER — Other Ambulatory Visit: Payer: Self-pay | Admitting: Oncology

## 2018-09-15 DIAGNOSIS — R911 Solitary pulmonary nodule: Secondary | ICD-10-CM

## 2018-09-15 NOTE — Progress Notes (Signed)
  Pulmonary Nodule Clinic Telephone Note  Received referral from PCP, Dr. Army Melia.   Per most recent guidelines and recommendations from [place] (2017), this patient requires a CT scan without contrast,  3-6 months from last scan and follow-up visit in the pulmonary nodule clinic.  I have personally reviewed all patient's previous imaging. Last CT scan completed on 03/15/18 revealed multiple new nodules throughout both lungs with the largest measuring 6 mm in the right middle lobe.  Previous imaging from 06/23/14 revealed a 4 mm lung nodule within the left lung apex and 4 mm nodule within the right lung apex. Per Fleischner guidelines (2017), CT scan without contrast is recommended in 3 to 6 months and if stable repeat CT scan at 18 to 24 months (from first scan) to ensure stability in high risk patients.  High risk factors include: History of heavy smoking, exposure to asbestos, radium or uranium, personal family history of lung cancer, older age, sex (females greater than males), race (black and native [place] greater than weight), marginal speculation, upper lobe location, multiplicity (less than 5 nodules increases risk for malignancy) and emphysema and/or pulmonary fibrosis.   This recommendation follows the consensus statement: Guidelines for Management of Incidental Pulmonary Nodules Detected on CT Images: From the Fleischner Society 2017; Radiology 2017; 284:228-243.    I have placed order for CT scan without contrast to be completed ASAP. I would like her to see me in our Pulmonary Nodule Clinic after her CT scan on scheduled clinic day (Friday Morning).   Given current pandemic COVID-19, follow-up/screening CT scans may be postponed at this time.   Patient will be notified of appointment.   Faythe Casa, NP 06/22/2018 2:22 PM

## 2018-09-19 ENCOUNTER — Telehealth: Payer: Self-pay | Admitting: Oncology

## 2018-09-19 NOTE — Telephone Encounter (Signed)
Appt confirmed for Wednesday 09/21/18 at 9 am. Will complete virtual visit at 1030 after imaging read.   Faythe Casa, NP 09/19/2018 9:08 AM

## 2018-09-21 ENCOUNTER — Ambulatory Visit
Admission: RE | Admit: 2018-09-21 | Discharge: 2018-09-21 | Disposition: A | Discharge status: Home / Self Care | Payer: BLUE CROSS/BLUE SHIELD | Source: Ambulatory Visit | Attending: Oncology | Admitting: Oncology

## 2018-09-21 ENCOUNTER — Inpatient Hospital Stay: Payer: BLUE CROSS/BLUE SHIELD | Attending: Oncology | Admitting: Oncology

## 2018-09-21 ENCOUNTER — Other Ambulatory Visit: Payer: Self-pay

## 2018-09-21 DIAGNOSIS — Z79899 Other long term (current) drug therapy: Secondary | ICD-10-CM

## 2018-09-21 DIAGNOSIS — Z803 Family history of malignant neoplasm of breast: Secondary | ICD-10-CM

## 2018-09-21 DIAGNOSIS — R911 Solitary pulmonary nodule: Secondary | ICD-10-CM | POA: Diagnosis not present

## 2018-09-21 DIAGNOSIS — Z800 Family history of malignant neoplasm of digestive organs: Secondary | ICD-10-CM

## 2018-09-21 DIAGNOSIS — F17210 Nicotine dependence, cigarettes, uncomplicated: Secondary | ICD-10-CM | POA: Diagnosis not present

## 2018-09-21 DIAGNOSIS — Z8041 Family history of malignant neoplasm of ovary: Secondary | ICD-10-CM

## 2018-09-21 DIAGNOSIS — R918 Other nonspecific abnormal finding of lung field: Secondary | ICD-10-CM | POA: Diagnosis not present

## 2018-09-21 NOTE — Progress Notes (Signed)
Pulmonary Nodule Clinic Consult note [facility]  [phone] [phone]  Patient Care Team: Glean Hess, MD as PCP - General (Internal Medicine) Grier Rocher, MD as Referring Physician (Dermatology) Rosina Lowenstein, MD (Obstetrics and Gynecology)   Name of the patient: Sharon [name]  [id number]  1961/04/16   Virtual Visit via Telephone Note  I connected with Sharon [name] on 09/21/18 at 4 PM by telephone and verified that I am speaking with the correct person using two identifiers.  Location: Patient: Home Provider: Office   I discussed the limitations, risks, security and privacy concerns of performing an evaluation and management service by telephone and the availability of in person appointments. I also discussed with the patient that there may be a patient responsible charge related to this service. The patient expressed understanding and agreed to proceed.  Diagnosis-lung nodule follow-up  Chief complaint/ Reason for visit- Pulmonary Nodule Clinic Initial Visit  Past Medical History:  Patient is recently evaluated by Dr. Army Melia for f/u  and review of CT scan completed in November 2019. Scan revealed multiple new pulmonary nodules throughout both lungs with the largest nodule measuring 6 mm in the right middle lobe.  This was compared to previous imaging from 06/23/2014 which showed a 4 mm lung nodule in the left lung apex and 4 mm nodule within the right lung apex were identified at that time.  Per Fleischner guidelines a CT scan without contrast is recommended for follow-up in 3 to 6 months.   Interval history-Per medical records, patient has family history cancer, personal history of vitamin D deficiency, seasonal allergies, polyp of sigmoid colon, hyperlipidemia, tobacco abuse and arthralgias.  She was last evaluated by Dr. Army Melia on 09/14/2018 to discuss results of  CT scan where she was referred to our pulmonary  nodule clinic.  She appeared to be doing well and only concern was follow-up of lung nodules.   She is a current every day cigarette smoker.  1 pack/day X 40 years. Not ready to quit.  She is employed and working as a Government social research officer at [employer]  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  Various textile jobs have been investigated in relation to increased risk of lung cancer and the most consistent epidemiological finding were between lung cancer and cotton dust. The textile industry workers are in continuous exposure to dyes, solvents, fibre dusts and various other toxic chemicals. Given she was not in direct contact with manufacturing the corduroy, this places her at a lower risk.  I explained to Sharon [name] that lung cancer is multifactorial and cotton dust alone is less likely  to cause lung cancer.  We discussed other occupational substances such as diesel fumes, natural fibers such as asbestos, silica and wood dust, metals including aluminum, arsenic and beryllium, radon, reactive chemicals, secondhand smoke and solvents like benzene and Toluene.  Most common occupations associated with the development of lung cancer include painters, Dispensing optician work, metal work, Tourist information centre manager, Conservation officer, historic buildings, truck driving, Chemical engineer, Press photographer, aluminum Engineer, civil (consulting).  Family cancer history include: maternal grandmother and aunt- breast cancer first-degree cousin-ovarian cancer  Maternal uncle- pancreatic cancer.   During the interim, patient has been doing well.  She denies any respiratory complaints at this time.  She continues to smoke 1 pack/day of cigarettes.  Has history of recurrent bronchitis/sinus infections.  Last reported at the end of last year.  Not currently  on any medications except for montelukast 10 mg at bedtime for seasonal allergies. Based on lung screening risk assessment, patient is at high risk  for the development of lung cancer.  She denies any concerns at this time. Denies any neurologic complaints. Denies recent fevers or illnesses. Denies any easy bleeding or bruising. Reports good appetite and denies weight loss. Denies chest pain. Denies any nausea, vomiting, constipation, or diarrhea. Denies urinary complaints.   ECOG FS:0 - Asymptomatic  Review of systems- Review of Systems  Constitutional: Negative.  Negative for chills, fever, malaise/fatigue and weight loss.  HENT: Negative for congestion, ear pain and tinnitus.   Eyes: Negative.  Negative for blurred vision and double vision.  Respiratory: Negative.  Negative for cough, sputum production and shortness of breath.   Cardiovascular: Negative.  Negative for chest pain, palpitations and leg swelling.  Gastrointestinal: Negative.  Negative for abdominal pain, constipation, diarrhea, nausea and vomiting.  Genitourinary: Negative for dysuria, frequency and urgency.  Musculoskeletal: Negative for back pain and falls.  Skin: Negative.  Negative for rash.  Neurological: Negative.  Negative for weakness and headaches.  Endo/Heme/Allergies: Negative.  Does not bruise/bleed easily.  Psychiatric/Behavioral: Negative.  Negative for depression. The patient is not nervous/anxious and does not have insomnia.      Allergies  Allergen Reactions   Amoxicillin Itching     Past Medical History:  Diagnosis Date   Benign neoplasm of ascending colon    BRCA negative 09/2016   My Risk neg   Disease of nail 03/11/2015   Encounter for nonprocreative genetic counseling    Family history of breast cancer    Floppy mitral valve    told this "long ago".  No issues   Headache    sinus   Increased risk of breast cancer 09/2016   IBIS=17.0%/riskscore=22.4%   Numbness in feet    s/p foot and back surgery   PONV (postoperative nausea and vomiting)    Rosacea    Special screening for malignant neoplasms, colon    Tendinitis of  wrist 03/11/2015     Past Surgical History:  Procedure Laterality Date   BUNIONECTOMY Bilateral    COLONOSCOPY WITH PROPOFOL N/A 03/12/2016   Procedure: COLONOSCOPY WITH PROPOFOL;  Surgeon: Lucilla Lame, MD;  Location: [facility];  Service: Endoscopy;  Laterality: N/A;   LUMBAR DISC SURGERY     POLYPECTOMY  03/12/2016   Procedure: POLYPECTOMY;  Surgeon: Lucilla Lame, MD;  Location: [facility];  Service: Endoscopy;;    Social History   Socioeconomic History   Marital status: Married    Spouse name: Not on file   Number of children: Not on file   Years of education: Not on file   Highest education level: Not on file  Occupational History   Not on file  Social Needs   Financial resource strain: Not on file   Food insecurity:    Worry: Not on file    Inability: Not on file   Transportation needs:    Medical: Not on file    Non-medical: Not on file  Tobacco Use   Smoking status: Current Every Day Smoker    Packs/day: 1.00    Years: 40.00    Pack years: 40.00    Types: Cigarettes   Smokeless tobacco: Never Used  Substance and Sexual Activity   Alcohol use: Yes    Alcohol/week: 2.0 standard drinks    Types: 2 Standard drinks or equivalent per week   Drug use: No  Sexual activity: Yes  Lifestyle   Physical activity:    Days per week: Not on file    Minutes per session: Not on file   Stress: Not on file  Relationships   Social connections:    Talks on phone: Not on file    Gets together: Not on file    Attends religious service: Not on file    Active member of club or organization: Not on file    Attends meetings of clubs or organizations: Not on file    Relationship status: Not on file   Intimate partner violence:    Fear of current or ex partner: Not on file    Emotionally abused: Not on file    Physically abused: Not on file    Forced sexual activity: Not on file  Other Topics Concern   Not on file  Social History  Narrative   Not on file    Family History  Problem Relation Age of Onset   Breast cancer Maternal Grandmother 60       twice, bilat breast--88   Pancreatic cancer Maternal Uncle 60   Breast cancer Other    Breast cancer Other    Ovarian cancer Other      Current Outpatient Medications:    celecoxib (CELEBREX) 200 MG capsule, TAKE 1 CAPSULE (200 MG TOTAL) BY MOUTH ONCE DAILY, Disp: , Rfl: 1   ergocalciferol (VITAMIN D2) 50000 units capsule, Take 1 capsule (50,000 Units total) by mouth once a week. For 3 months, Disp: 14 capsule, Rfl: 0   fluticasone (FLONASE) 50 MCG/ACT nasal spray, Place 2 sprays into both nostrils daily., Disp: 48 g, Rfl: 3   loratadine (CLARITIN REDITABS) 10 MG dissolvable tablet, Take 1 tablet by mouth daily., Disp: , Rfl:    montelukast (SINGULAIR) 10 MG tablet, Take 1 tablet (10 mg total) by mouth at bedtime., Disp: 90 tablet, Rfl: 3  Physical exam: There were no vitals filed for this visit. Physical Exam   Telephone visit  CMP Latest Ref Rng & Units 03/11/2018  Glucose 65 - 99 mg/dL 94  BUN 6 - 24 mg/dL 11  Creatinine 0.57 - 1.00 mg/dL 0.72  Sodium 134 - 144 mmol/L 144  Potassium 3.5 - 5.2 mmol/L 4.7  Chloride 96 - 106 mmol/L 105  CO2 20 - 29 mmol/L 26  Calcium 8.7 - 10.2 mg/dL 9.7  Total Protein 6.0 - 8.5 g/dL 6.3  Total Bilirubin 0.0 - 1.2 mg/dL 0.3  Alkaline Phos 39 - 117 IU/L 127(H)  AST 0 - 40 IU/L 9  ALT 0 - 32 IU/L 9   CBC Latest Ref Rng & Units 03/11/2018  WBC 3.4 - 10.8 x10E3/uL 10.6  Hemoglobin 11.1 - 15.9 g/dL 14.2  Hematocrit 34.0 - 46.6 % 42.4  Platelets 150 - 450 x10E3/uL 339    No images are attached to the encounter.  Ct Chest Wo Contrast  Result Date: 09/21/2018 CLINICAL DATA:  58 year old female with lung nodule EXAM: CT CHEST WITHOUT CONTRAST TECHNIQUE: Multidetector CT imaging of the chest was performed following the standard protocol without IV contrast. COMPARISON:  03/15/2018 FINDINGS: Cardiovascular: Heart  size within normal limits. No pericardial fluid/thickening. Trace soft tissue within the anterior mediastinal space, with triangular configuration no cystic change or calcification, and no infiltrative border. This most likely represents residual thymus. Calcifications of left anterior descending coronary artery. Unremarkable course caliber contour of the thoracic aorta. Calcifications of the aortic arch. Mediastinum/Nodes: Small lymph nodes of the mediastinum, without adenopathy.  Unremarkable thoracic inlet. No axillary adenopathy or supraclavicular adenopathy. Unremarkable thoracic esophagus. Hiatal hernia. Lungs/Pleura: Redemonstration of centrilobular and paraseptal emphysema. No pleural effusion. No confluent airspace disease. No edema. No pneumothorax. Adjacent sub solid nodules of the right lower lobe on image 81 of series 3. Greatest diameter measures larger than 6 mm, up to 9 mm-10 mm. Associated architectural distortion. Size is unchanged compared to the prior CT. There are multiple bilateral subpleural lymph nodes/nodules, none of which have enlarged in the interval, the left lower lobe posteriorly appears to have decreased. Unchanged appearance of left apically small nodules, the largest measuring 3 mm-4 mm (image 56 of series 3). Upper Abdomen: No acute finding of the upper abdomen. Musculoskeletal: No acute displaced fracture. IMPRESSION: No acute finding. Persisting sub solid nodule of the right lower lobe, greater than 6 mm. Follow-up non-contrast CT recommended up to 5 years 2 confer stability. If persistent these nodules should be considered highly suspicious if the solid component of the nodule is 6 mm or greater in size and enlarging. This recommendation follows the consensus statement: Guidelines for Management of Incidental Pulmonary Nodules Detected on CT Images: From the Fleischner Society 2017; Radiology 2017; 284:228-243. Aortic Atherosclerosis (ICD10-I70.0) and Emphysema (ICD10-J43.9).  Electronically Signed   By: Corrie Mckusick D.O.   On: 09/21/2018 14:36     Plan- Patient is a 58 y.o. female who presents to pulmonary nodule clinic for follow-up of incidental lung nodules.  A telephone visit was conducted to review most recent CT scan results.   CT chest without contrast from today 09/21/2018 revealed persistent sub-solid nodule of the right lower lobe greater than 6 mm.  Size is unchanged compared to prior CT.  Multiple bilateral subpleural lymph nodes/nodules none of which have enlarged in the interval, the left lower lobe posteriorly appears to have decreased.  Unchanged appearance of left apical small nodule.  Calculating malignancy probability of a pulmonary nodule: Risk factors include: 1.  Age. 2.  Cancer history. 3.  Diameter of pulmonary nodule and mm 4.  Location 5.  Smoking history 6.  Spiculation present   Based on risk factors, this patient is high risk for the development of lung cancer even in absence of a lung nodule.  I would recommend a 69-monthfollow-up with imaging to ensure stability given smoking history.  If imaging stable in 189-month would recommend follow-up with annual low-dose CT screening. Will make referral if appropriate.   During our visit, we discussed pulmonary nodules are a common incidental finding and are often how lung cancer is discovered.  Lung cancer survival is directly related to the stage at diagnosis.  We discussed that nodules can vary in presentation from solitary pulmonary nodules to masses, 2 groundglass opacities and multiple nodules.  Pulmonary nodules in the majority of cases are benign but the probability of these becoming malignant cannot be undermined.  Early identification of malignant nodules could lead to early diagnosis and increased survival.  We discussed the probability of pulmonary nodules becoming malignant increase with age, pack years of tobacco use, size/characteristics of the nodule and location; with upper  lobe involvement being most worrisome.  We discussed the goal of our clinic is to thoroughly evaluate each nodule, developed a comprehensive, individualized plan of care utilizing the most advanced technology and significantly reduce the time from detection to treatment.  A dedicated pulmonary nodule clinic has proven to indeed expedite the detection and treatment of lung cancer.  Patient education in fact sheet provided along with  most recent CT scans.  Disposition: Repeat noncontrast CT in approximately 6 months to ensure stability. RTC a few days later for results of scan, assessment and further recommendations.  Visit Diagnosis 1. Lung nodule     I provided 25 minutes of non face-to-face telephone visit time during this encounter, and > 50% was spent counseling as documented under my assessment & plan.  Patient expressed understanding and was in agreement with this plan. She also understands that She can call clinic at any time with any questions, concerns, or complaints.   Thank you for allowing me to participate in the care of this very pleasant patient.   CC: Dr. Linus Orn, NP [facility] at [facility] Cell - [phone] Pager- [phone] 09/22/2018 12:18 PM

## 2018-10-18 ENCOUNTER — Other Ambulatory Visit: Payer: Self-pay | Admitting: Oncology

## 2018-10-18 DIAGNOSIS — R911 Solitary pulmonary nodule: Secondary | ICD-10-CM

## 2019-01-05 ENCOUNTER — Telehealth: Payer: Self-pay | Admitting: *Deleted

## 2019-01-05 NOTE — Telephone Encounter (Signed)
Pt has been made aware of upcoming appts for follow up CT scan and follow up appt with Jennifer Burns, NP in the Lung Nodule Clinic. Pt verbalized understanding. Nothing further needed at this time. 

## 2019-01-26 ENCOUNTER — Other Ambulatory Visit: Payer: Self-pay

## 2019-01-26 ENCOUNTER — Ambulatory Visit
Admission: RE | Admit: 2019-01-26 | Discharge: 2019-01-26 | Disposition: A | Discharge status: Home / Self Care | Payer: BC Managed Care – PPO | Source: Ambulatory Visit | Attending: Internal Medicine | Admitting: Internal Medicine

## 2019-01-26 DIAGNOSIS — Z1231 Encounter for screening mammogram for malignant neoplasm of breast: Secondary | ICD-10-CM | POA: Diagnosis not present

## 2019-02-06 DIAGNOSIS — Z720 Tobacco use: Secondary | ICD-10-CM | POA: Diagnosis not present

## 2019-02-06 DIAGNOSIS — J301 Allergic rhinitis due to pollen: Secondary | ICD-10-CM | POA: Diagnosis not present

## 2019-02-06 DIAGNOSIS — H9319 Tinnitus, unspecified ear: Secondary | ICD-10-CM | POA: Diagnosis not present

## 2019-02-06 DIAGNOSIS — H903 Sensorineural hearing loss, bilateral: Secondary | ICD-10-CM | POA: Diagnosis not present

## 2019-03-14 ENCOUNTER — Other Ambulatory Visit: Payer: Self-pay

## 2019-03-14 ENCOUNTER — Encounter: Payer: Self-pay | Admitting: Internal Medicine

## 2019-03-14 ENCOUNTER — Telehealth: Payer: Self-pay

## 2019-03-14 ENCOUNTER — Ambulatory Visit (INDEPENDENT_AMBULATORY_CARE_PROVIDER_SITE_OTHER): Payer: BC Managed Care – PPO | Admitting: Internal Medicine

## 2019-03-14 VITALS — BP 104/66 | HR 73 | Ht 70.0 in | Wt 201.0 lb

## 2019-03-14 DIAGNOSIS — D126 Benign neoplasm of colon, unspecified: Secondary | ICD-10-CM

## 2019-03-14 DIAGNOSIS — E782 Mixed hyperlipidemia: Secondary | ICD-10-CM | POA: Diagnosis not present

## 2019-03-14 DIAGNOSIS — F17200 Nicotine dependence, unspecified, uncomplicated: Secondary | ICD-10-CM | POA: Diagnosis not present

## 2019-03-14 DIAGNOSIS — J3089 Other allergic rhinitis: Secondary | ICD-10-CM | POA: Diagnosis not present

## 2019-03-14 DIAGNOSIS — Z0000 Encounter for general adult medical examination without abnormal findings: Secondary | ICD-10-CM | POA: Diagnosis not present

## 2019-03-14 DIAGNOSIS — R918 Other nonspecific abnormal finding of lung field: Secondary | ICD-10-CM

## 2019-03-14 DIAGNOSIS — Z23 Encounter for immunization: Secondary | ICD-10-CM | POA: Diagnosis not present

## 2019-03-14 DIAGNOSIS — E559 Vitamin D deficiency, unspecified: Secondary | ICD-10-CM | POA: Diagnosis not present

## 2019-03-14 LAB — POCT URINALYSIS DIPSTICK
Bilirubin, UA: NEGATIVE
Blood, UA: NEGATIVE
Glucose, UA: NEGATIVE
Ketones, UA: NEGATIVE
Leukocytes, UA: NEGATIVE
Nitrite, UA: NEGATIVE
Protein, UA: NEGATIVE
Spec Grav, UA: 1.01 (ref 1.010–1.025)
Urobilinogen, UA: 0.2 E.U./dL
pH, UA: 7 (ref 5.0–8.0)

## 2019-03-14 NOTE — Progress Notes (Signed)
Date:  03/14/2019   Name:  Sharon [name]   DOB:  Dec 19, 1960   MRN:  [id number]   Chief Complaint: Annual Exam (No breast exam- mamo last month. No pap until next year. Reg dose flu shot.  ) Sharon [name] is a 58 y.o. female who presents today for her Complete Annual Exam. She feels well. She reports exercising none. She reports she is sleeping fairly well.   Mammogram  01/2019 Pap smear  09/2016 - neg with cotesting Colonoscopy  03/2016 - repeat due now  HPI  Review of Systems  Constitutional: Negative for chills, fatigue and fever.  HENT: Positive for tinnitus (seen by ENT - age related). Negative for congestion, hearing loss, trouble swallowing and voice change.   Eyes: Negative for visual disturbance.  Respiratory: Negative for cough, chest tightness, shortness of breath and wheezing.   Cardiovascular: Negative for chest pain, palpitations and leg swelling.  Gastrointestinal: Negative for abdominal pain, constipation, diarrhea and vomiting.  Endocrine: Negative for polydipsia and polyuria.  Genitourinary: Negative for dysuria, frequency, genital sores, vaginal bleeding and vaginal discharge.  Musculoskeletal: Negative for arthralgias, gait problem and joint swelling.  Skin: Negative for color change and rash.  Neurological: Negative for dizziness, tremors, light-headedness and headaches.  Hematological: Negative for adenopathy. Does not bruise/bleed easily.  Psychiatric/Behavioral: Negative for dysphoric mood and sleep disturbance. The patient is not nervous/anxious.     Patient Active Problem List   Diagnosis Date Noted  . Mixed hyperlipidemia 03/11/2018  . Multiple pulmonary nodules 03/11/2018  . Family history of breast cancer   . Vitamin D deficiency 09/28/2016  . Increased risk of breast cancer 09/08/2016  . Environmental and seasonal allergies 05/22/2016  . Polyp of sigmoid colon   . Pain in shoulder 03/11/2015  . Muscle spasms of neck 03/11/2015  . Acne  erythematosa 03/11/2015  . Seborrhea capitis 03/11/2015  . 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 Compulsive tobacco user syndrome 03/11/2015    Allergies  Allergen Reactions  . Amoxicillin Itching    Past Surgical History:  Procedure Laterality Date  . BUNIONECTOMY Bilateral   . COLONOSCOPY WITH PROPOFOL N/A 03/12/2016   Procedure: COLONOSCOPY WITH PROPOFOL;  Surgeon: Lucilla Lame, MD;  Location: [facility];  Service: Endoscopy;  Laterality: N/A;  . LUMBAR [place]    . POLYPECTOMY  03/12/2016   Procedure: POLYPECTOMY;  Surgeon: Lucilla Lame, MD;  Location: [facility];  Service: Endoscopy;;    Social History   Tobacco Use  . Smoking status: Current Every Day Smoker    Packs/day: 1.00    Years: 40.00    Pack years: 40.00    Types: Cigarettes  . Smokeless tobacco: Never Used  Substance Use Topics  . Alcohol use: Yes    Alcohol/week: 2.0 standard drinks    Types: 2 Standard drinks or equivalent per week  . Drug use: No     Medication list has been reviewed and updated.  Current Meds  Medication Sig  . loratadine (CLARITIN REDITABS) 10 MG dissolvable tablet Take 1 tablet by mouth daily.  . montelukast (SINGULAIR) 10 MG tablet Take 1 tablet (10 mg total) by mouth at bedtime.  . triamcinolone (NASACORT ALLERGY 24HR) 55 MCG/ACT AERO nasal inhaler Place 2 sprays into the nose daily.  . Vitamin D, Cholecalciferol, 50 MCG (2000 UT) CAPS Take by mouth daily.  . [DISCONTINUED] fluticasone (FLONASE) 50 MCG/ACT nasal spray Place 2 sprays into both nostrils daily.    PHQ 2/9 Scores 03/14/2019 09/14/2018  03/11/2018 11/04/2017  PHQ - 2 Score 0 0 0 0  PHQ- 9 Score 2 - - -    BP Readings from Last 3 Encounters:  03/14/19 104/66  09/14/18 124/70  03/11/18 104/62    Physical Exam Vitals signs and nursing note reviewed.  Constitutional:      General: She is not in acute distress.    Appearance: She is well-developed.  HENT:     Head: Normocephalic and atraumatic.      Right Ear: Tympanic membrane and ear canal normal.     Left Ear: Tympanic membrane and ear canal normal.     Nose:     Right Sinus: No maxillary sinus tenderness.     Left Sinus: No maxillary sinus tenderness.  Eyes:     General: No scleral icterus.       Right eye: No discharge.        Left eye: No discharge.     Conjunctiva/sclera: Conjunctivae normal.  Neck:     Musculoskeletal: Normal range of motion. No erythema.     Thyroid: No thyromegaly.     Vascular: No carotid bruit.  Cardiovascular:     Rate and Rhythm: Normal rate and regular rhythm.     Pulses: Normal pulses.     Heart sounds: Normal heart sounds.  Pulmonary:     Effort: Pulmonary effort is normal. No respiratory distress.     Breath sounds: No wheezing.  Abdominal:     General: Bowel sounds are normal.     Palpations: Abdomen is soft.     Tenderness: There is no abdominal tenderness.  Musculoskeletal: Normal range of motion.  Lymphadenopathy:     Cervical: No cervical adenopathy.  Skin:    General: Skin is warm and dry.     Findings: No rash.  Neurological:     Mental Status: She is alert and oriented to person, place, and time.     Cranial Nerves: No cranial nerve deficit.     Sensory: No sensory deficit.     Deep Tendon Reflexes: Reflexes are normal and symmetric.  Psychiatric:        Speech: Speech normal.        Behavior: Behavior normal.        Thought Content: Thought content normal.     Wt Readings from Last 3 Encounters:  03/14/19 201 lb (91.2 kg)  09/14/18 206 lb (93.4 kg)  03/11/18 210 lb (95.3 kg)    BP 104/66   Pulse 73   Ht 5\' 10"  (1.778 m)   Wt 201 lb (91.2 kg)   SpO2 94%   BMI 28.84 kg/m   Assessment and Plan: 1. Annual physical exam Normal exam except for weight Recommend healthy diet and regular exercise - CBC with Differential/Platelet - Comprehensive metabolic panel - TSH - POCT urinalysis dipstick  2. Environmental and seasonal allergies Patient is doing well with  good control of symptoms on Singulair, Claritin and Nasacort spray She does have some mild tinnitus that is age related per ENT  3. Compulsive tobacco user syndrome Pt is not interested in trying to quit smoking at this time  4. Multiple pulmonary nodules Being followed by the nodule clinic Repeat CT is scheduled next week  5. Vitamin D deficiency She has been on high dose in the past, now taking daily otc dosing. Last level = 16.  It has not been checked since then. - VITAMIN D 25 Hydroxy (Vit-D Deficiency, Fractures)  6. Mixed hyperlipidemia Will check  labs and advise Last 10 yr risk was low at 4.5% - Lipid panel  7. Adenomatous polyp of colon, unspecified part of colon SSA in 2017 - GI recommend follow up in 3 years She has not been contacted so will send referral - Ambulatory referral to Gastroenterology   Partially dictated using [place]. Any errors are unintentional.  Halina Maidens, MD [facility]  03/14/2019

## 2019-03-14 NOTE — Telephone Encounter (Signed)
Gastroenterology Pre-Procedure Review  Request Date: 04/24/19 Requesting Physician: Dr. Allen Norris  PATIENT REVIEW QUESTIONS: The patient responded to the following health history questions as indicated:    1. Are you having any GI issues? no 2. Do you have a personal history of Polyps? yes (2017 Dr. Allen Norris) 3. Do you have a family history of Colon Cancer or Polyps? no 4. Diabetes Mellitus? no 5. Joint replacements in the past 12 months?no 6. Major health problems in the past 3 months?no 7. Any artificial heart valves, MVP, or defibrillator?no    MEDICATIONS & ALLERGIES:    Patient reports the following regarding taking any anticoagulation/antiplatelet therapy:   Plavix, Coumadin, Eliquis, Xarelto, Lovenox, Pradaxa, Brilinta, or Effient? no Aspirin? no  Patient confirms/reports the following medications:  Current Outpatient Medications  Medication Sig Dispense Refill  . loratadine (CLARITIN REDITABS) 10 MG dissolvable tablet Take 1 tablet by mouth daily.    . montelukast (SINGULAIR) 10 MG tablet Take 1 tablet (10 mg total) by mouth at bedtime. 90 tablet 3  . triamcinolone (NASACORT ALLERGY 24HR) 55 MCG/ACT AERO nasal inhaler Place 2 sprays into the nose daily.    . Vitamin D, Cholecalciferol, 50 MCG (2000 UT) CAPS Take by mouth daily.     No current facility-administered medications for this visit.     Patient confirms/reports the following allergies:  Allergies  Allergen Reactions  . Amoxicillin Itching    No orders of the defined types were placed in this encounter.   AUTHORIZATION INFORMATION Primary Insurance: 1D#: Group #:  Secondary Insurance: 1D#: Group #:  SCHEDULE INFORMATION: Date: 04/24/19 Time: Location:[facility]

## 2019-03-15 LAB — LIPID PANEL
Chol/HDL Ratio: 3.8 ratio (ref 0.0–4.4)
Cholesterol, Total: 227 mg/dL — ABNORMAL HIGH (ref 100–199)
HDL: 60 mg/dL (ref >39)
LDL Chol Calc (NIH): 148 mg/dL — ABNORMAL HIGH (ref 0–99)
Triglycerides: 107 mg/dL (ref 0–149)
VLDL Cholesterol Cal: 19 mg/dL (ref 5–40)

## 2019-03-15 LAB — CBC WITH DIFFERENTIAL/PLATELET
Basophils Absolute: 0.1 10*3/uL (ref 0.0–0.2)
Basos: 1 %
EOS (ABSOLUTE): 0.3 10*3/uL (ref 0.0–0.4)
Eos: 4 %
Hematocrit: 41.8 % (ref 34.0–46.6)
Hemoglobin: 14.1 g/dL (ref 11.1–15.9)
Immature Grans (Abs): 0 10*3/uL (ref 0.0–0.1)
Immature Granulocytes: 0 %
Lymphocytes Absolute: 2.8 10*3/uL (ref 0.7–3.1)
Lymphs: 29 %
MCH: 31.8 pg (ref 26.6–33.0)
MCHC: 33.7 g/dL (ref 31.5–35.7)
MCV: 94 fL (ref 79–97)
Monocytes Absolute: 0.6 10*3/uL (ref 0.1–0.9)
Monocytes: 6 %
Neutrophils Absolute: 5.9 10*3/uL (ref 1.4–7.0)
Neutrophils: 60 %
Platelets: 318 10*3/uL (ref 150–450)
RBC: 4.43 x10E6/uL (ref 3.77–5.28)
RDW: 11.8 % (ref 11.7–15.4)
WBC: 9.8 10*3/uL (ref 3.4–10.8)

## 2019-03-15 LAB — COMPREHENSIVE METABOLIC PANEL
ALT: 15 IU/L (ref 0–32)
AST: 13 IU/L (ref 0–40)
Albumin/Globulin Ratio: 2.3 — ABNORMAL HIGH (ref 1.2–2.2)
Albumin: 4.3 g/dL (ref 3.8–4.9)
Alkaline Phosphatase: 118 IU/L — ABNORMAL HIGH (ref 39–117)
BUN/Creatinine Ratio: 12 (ref 9–23)
BUN: 8 mg/dL (ref 6–24)
Bilirubin Total: 0.3 mg/dL (ref 0.0–1.2)
CO2: 27 mmol/L (ref 20–29)
Calcium: 9.5 mg/dL (ref 8.7–10.2)
Chloride: 104 mmol/L (ref 96–106)
Creatinine, Ser: 0.67 mg/dL (ref 0.57–1.00)
GFR calc Af Amer: 112 mL/min/{1.73_m2} (ref >59)
GFR calc non Af Amer: 97 mL/min/{1.73_m2} (ref >59)
Globulin, Total: 1.9 g/dL (ref 1.5–4.5)
Glucose: 95 mg/dL (ref 65–99)
Potassium: 4.7 mmol/L (ref 3.5–5.2)
Sodium: 143 mmol/L (ref 134–144)
Total Protein: 6.2 g/dL (ref 6.0–8.5)

## 2019-03-15 LAB — VITAMIN D 25 HYDROXY (VIT D DEFICIENCY, FRACTURES): Vit D, 25-Hydroxy: 45.5 ng/mL (ref 30.0–100.0)

## 2019-03-15 LAB — TSH: TSH: 1.49 u[IU]/mL (ref 0.450–4.500)

## 2019-03-21 ENCOUNTER — Other Ambulatory Visit: Payer: Self-pay

## 2019-03-21 ENCOUNTER — Ambulatory Visit
Admission: RE | Admit: 2019-03-21 | Discharge: 2019-03-21 | Disposition: A | Discharge status: Home / Self Care | Payer: BC Managed Care – PPO | Source: Ambulatory Visit | Attending: Oncology | Admitting: Oncology

## 2019-03-21 DIAGNOSIS — J432 Centrilobular emphysema: Secondary | ICD-10-CM | POA: Diagnosis not present

## 2019-03-21 DIAGNOSIS — R911 Solitary pulmonary nodule: Secondary | ICD-10-CM | POA: Insufficient documentation

## 2019-03-22 ENCOUNTER — Inpatient Hospital Stay: Payer: BC Managed Care – PPO | Attending: Oncology | Admitting: Oncology

## 2019-03-22 DIAGNOSIS — R911 Solitary pulmonary nodule: Secondary | ICD-10-CM

## 2019-03-22 NOTE — Progress Notes (Signed)
Pulmonary Nodule Clinic Consult note [facility]  Telephone:([phone] [phone]  Patient Care Team: Glean Hess, MD as PCP - General (Internal Medicine) Grier Rocher, MD as Referring Physician (Dermatology) Rosina Lowenstein, MD (Obstetrics and Gynecology)   Name of the patient: Sharon [name]  [id number]  01/15/1961   Date of visit: 03/22/2019   Diagnosis- Lung Nodule follow-up  Virtual Visit via Telephone Note   I connected with Sharon [name] on 03/23/19 at @ 1pm  by telephone visit and verified that I am speaking with the correct person using two identifiers.   I discussed the limitations, risks, security and privacy concerns of performing an evaluation and management service by telemedicine and the availability of in-person appointments. I also discussed with the patient that there may be a patient responsible charge related to this service. The patient expressed understanding and agreed to proceed.   Other persons participating in the visit and their role in the encounter:   Patient's location: Home  Provider's location: Office  Chief complaint/ Reason for visit- Pulmonary Nodule Clinic f/u visit.   Past Medical History: Patient was initially evaluated by Dr. Army Melia to follow-up on a CT scan completed in November 2019.  Scan revealed multiple new pulmonary nodules throughout both lungs with the largest measuring 6 mm in the right middle lobe.  This was compared to previous imaging from 06/23/2014 which showed a 4 mm lung nodule in the left lung apex and 4 mm nodule within the right lung apex.  Per Fleischner's guidelines a CT scan without contrast was recommended within 3 to 6 months.  Follow-up CT completed on 09/21/2018 revealed persistent subsolid nodule of the right lower lobe greater than 6 mm.  Size remain unchanged.  Multiple bilateral subpleural lymph nodes/nodules none of which have enlarged in the interval, the left lower  lobe posteriorly appears to have decreased.  Unchanged appearance of left apical small nodule given her history of smoking it was recommended for her to have a 35-monthfollow-up.  Per medical records, patient has family history of breast, ovarian and pancreatic cancer, personal history of vitamin D deficiency, seasonal allergies, polyp of sigmoid colon, hyperlipidemia, tobacco abuse and arthralgias.  Her past medical history is positive for: Past Medical History:  Diagnosis Date  . Benign neoplasm of ascending colon   . BRCA negative 09/2016   My Risk neg  . Disease of nail 03/11/2015  . Encounter for nonprocreative genetic counseling   . Family history of breast cancer   . 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  No issues  . Headache    sinus  . Increased risk of breast cancer 09/2016   IBIS=17.0%/riskscore=22.4%  . Numbness in feet    s/p foot and back surgery  . PONV (postoperative nausea and vomiting)   . Rosacea   . Special screening for malignant neoplasms, colon   . Tendinitis of wrist 03/11/2015   Her past surgical history is positive for: Past Surgical History:  Procedure Laterality Date  . BUNIONECTOMY Bilateral   . COLONOSCOPY WITH PROPOFOL N/A 03/12/2016   Procedure: COLONOSCOPY WITH PROPOFOL;  Surgeon: DLucilla Lame MD;  [place]  Service: Endoscopy;  Laterality: N/A;  . LUMBAR [place]   . POLYPECTOMY  03/12/2016   Procedure: POLYPECTOMY;  Surgeon: DLucilla Lame MD;  [place]  Service: Endoscopy;;   Interval history-since her last visit here at the lung nodule clinic she has been  doing well.  She continues to smoke daily approximately 1 pack/day for the past 35 to 40 years.  She is currently not ready to quit.  She is currently still employed and working as a Government social research officer at [employer]  Due to COVID-19 she is currently working from home.  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  She had various other textile jobs.  She denies any direct contact with toxic chemicals known to cause cancer.  She had her annual wellness checkup with Dr. Army Melia on 03/14/2019 including lab work.  Admitted to some tinnitus.  Was evaluated by ENT and thought to be age-related.  In the interim, she has been doing well.  She notes continued ringing in her ears intermittently.  Otherwise she has been feeling well and enjoys working from home.  She denies any recent fevers or illness, chest pain, shortness of breath, nausea, vomiting, constipation or diarrhea.  She denies any appetite changes or unexplained weight loss.  She denies any urinary concerns.  ECOG FS:0 - Asymptomatic  Review of systems- Review of Systems  Constitutional: Negative.  Negative for chills, fever, malaise/fatigue and weight loss.  HENT: Positive for tinnitus. Negative for congestion and ear pain.   Eyes: Negative.  Negative for blurred vision and double vision.  Respiratory: Negative.  Negative for cough, sputum production and shortness of breath.   Cardiovascular: Negative.  Negative for chest pain, palpitations and leg swelling.  Gastrointestinal: Negative.  Negative for abdominal pain, constipation, diarrhea, nausea and vomiting.  Genitourinary: Negative for dysuria, frequency and urgency.  Musculoskeletal: Negative for back pain and falls.  Skin: Negative.  Negative for rash.  Neurological: Negative.  Negative for weakness and headaches.  Endo/Heme/Allergies: Negative.  Does not bruise/bleed easily.  Psychiatric/Behavioral: Negative.  Negative for depression. The patient is not nervous/anxious and does not have insomnia.      Allergies  Allergen Reactions  . Amoxicillin Itching     Past Medical History:  Diagnosis Date  . Benign neoplasm of ascending colon   . BRCA negative 09/2016   My Risk neg  . Disease of nail 03/11/2015  . Encounter for  nonprocreative genetic counseling   . Family history of breast cancer   . 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  No issues  . Headache    sinus  . Increased risk of breast cancer 09/2016   IBIS=17.0%/riskscore=22.4%  . Numbness in feet    s/p foot and back surgery  . PONV (postoperative nausea and vomiting)   . Rosacea   . Special screening for malignant neoplasms, colon   . Tendinitis of wrist 03/11/2015     Past Surgical History:  Procedure Laterality Date  . BUNIONECTOMY Bilateral   . COLONOSCOPY WITH PROPOFOL N/A 03/12/2016   Procedure: COLONOSCOPY WITH PROPOFOL;  Surgeon: Lucilla Lame, MD;  Location: [place];  Service: Endoscopy;  Laterality: N/A;  . LUMBAR [place]    . POLYPECTOMY  03/12/2016   Procedure: POLYPECTOMY;  Surgeon: Lucilla Lame, MD;  Location: [place];  Service: Endoscopy;;    Social History   Socioeconomic History  . Marital status: Married    Spouse name: Not on file  . Number of children: Not on file  . Years of education: Not on file  . Highest education level: Not on file  Occupational History  . Not on file  Social Needs  . Emergency planning/management officer  strain: Not on file  . Food insecurity    Worry: Not on file    Inability: Not on file  . Transportation needs    Medical: Not on file    Non-medical: Not on file  Tobacco Use  . Smoking status: Current Every Day Smoker    Packs/day: 1.00    Years: 40.00    Pack years: 40.00    Types: Cigarettes  . Smokeless tobacco: Never Used  Substance and Sexual Activity  . Alcohol use: Yes    Alcohol/week: 2.0 standard drinks    Types: 2 Standard drinks or equivalent per week  . Drug use: No  . Sexual activity: Yes  Lifestyle  . Physical activity    Days per week: Not on file    Minutes per session: Not on file  . Stress: Not on file  Relationships  . Social Herbalist on phone: Not on file    Gets together: Not on file    Attends religious service: Not on  file    Active member of club or organization: Not on file    Attends meetings of clubs or organizations: Not on file    Relationship status: Not on file  . Intimate partner violence    Fear of current or ex partner: Not on file    Emotionally abused: Not on file    Physically abused: Not on file    Forced sexual activity: Not on file  Other Topics Concern  . Not on file  Social History Narrative  . Not on file    Family History  Problem Relation Age of Onset  . Breast cancer Maternal Grandmother 60       twice, bilat breast--88  . Pancreatic cancer Maternal Uncle 60  . Breast cancer Other   . Breast cancer Other   . Ovarian cancer Other      Current Outpatient Medications:  .  loratadine (CLARITIN REDITABS) 10 MG dissolvable tablet, Take 1 tablet by mouth daily., Disp: , Rfl:  .  montelukast (SINGULAIR) 10 MG tablet, Take 1 tablet (10 mg total) by mouth at bedtime., Disp: 90 tablet, Rfl: 3 .  triamcinolone (NASACORT ALLERGY 24HR) 55 MCG/ACT AERO nasal inhaler, Place 2 sprays into the nose daily., Disp: , Rfl:  .  Vitamin D, Cholecalciferol, 50 MCG (2000 UT) CAPS, Take by mouth daily., Disp: , Rfl:   Physical exam: There were no vitals filed for this visit. Limited d/t telephone visit.   CMP Latest Ref Rng & Units 03/14/2019  Glucose 65 - 99 mg/dL 95  BUN 6 - 24 mg/dL 8  Creatinine 0.57 - 1.00 mg/dL 0.67  Sodium 134 - 144 mmol/L 143  Potassium 3.5 - 5.2 mmol/L 4.7  Chloride 96 - 106 mmol/L 104  CO2 20 - 29 mmol/L 27  Calcium 8.7 - 10.2 mg/dL 9.5  Total Protein 6.0 - 8.5 g/dL 6.2  Total Bilirubin 0.0 - 1.2 mg/dL 0.3  Alkaline Phos 39 - 117 IU/L 118(H)  AST 0 - 40 IU/L 13  ALT 0 - 32 IU/L 15   CBC Latest Ref Rng & Units 03/14/2019  WBC 3.4 - 10.8 x10E3/uL 9.8  Hemoglobin 11.1 - 15.9 g/dL 14.1  Hematocrit 34.0 - 46.6 % 41.8  Platelets 150 - 450 x10E3/uL 318    No images are attached to the encounter.  Ct Chest Wo Contrast  Result Date: 03/21/2019 CLINICAL  DATA:  Pulmonary nodule. EXAM: CT CHEST WITHOUT CONTRAST TECHNIQUE: Multidetector CT  imaging of the chest was performed following the standard protocol without IV contrast. COMPARISON:  09/21/2018 FINDINGS: Cardiovascular: The heart size is normal. No substantial pericardial effusion. Coronary artery calcification is evident. Atherosclerotic calcification is noted in the wall of the thoracic aorta. Mediastinum/Nodes: No mediastinal lymphadenopathy. No evidence for gross hilar lymphadenopathy although assessment is limited by the lack of intravenous contrast on today's study. The esophagus has normal imaging features. There is no axillary lymphadenopathy. 13 mm right thyroid nodule is stable in the interval and also not substantially changed comparing back to cervical spine CT of 06/23/2014. Lungs/Pleura: Centrilobular emphsyema noted. Scattered tiny bilateral subpleural and parenchymal nodules in both lungs are similar to prior, without appreciable change. Largest nodule is an irregular 5 x 10 mm right lower lobe parenchymal lesion on 85/3. No new suspicious nodule or mass. No focal airspace consolidation. No pleural effusion. Upper Abdomen: 1.9 cm right adrenal nodule has attenuation consistent with benign adrenal adenoma. 1.6 cm left adrenal nodule has attenuation too high to be characterized as adenoma. Musculoskeletal: No worrisome lytic or sclerotic osseous abnormality. IMPRESSION: 1. 5 x 10 mm irregular parenchymal lesion in the right lower lobe is stable since 09/21/2018. 6 month imaging stability is reassuring. Follow-up CT chest without contrast in 12 months recommended to confirm continued stability. 2. Bilateral adrenal nodules are stable. Right adrenal nodule is consistent with adenoma. Left adrenal nodule has attenuation too high to be characterized as an adenoma. 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 Attention on follow-up CT recommended. 3.   Emphysema. (ICD10-J43.9) 4.  Aortic Atherosclerois (ICD10-170.0) Electronically Signed   By: Misty Stanley M.D.   On: 03/21/2019 11:35   Assessment and plan- Patient is a 58 y.o. female who presents to pulmonary nodule clinic for follow-up of incidental lung nodules.  A telephone visit was conducted to review most recent CT scan results.    CT chest without contrast on 03/21/19 revealed a 5 x 10 mm irregular parenchymal lesion in the right lower lobe that is stable since 09/21/2018.  45-monthimaging stability is reassuring.  Follow-up CT chest without contrast is recommended in 12 months to confirm stability.  Incidental finding of bilateral adrenal nodules which are stable.  Right adrenal nodules consistent with adenoma.  Left adrenal nodule has attenuation too high to be characterized as an adenoma.  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   Calculating malignancy probability of a pulmonary nodule: Risk factors include: 1.  Age. 2.  Cancer history. 3.  Diameter of pulmonary nodule and mm 4.  Location 5.  Smoking history 6.  Spiculation present   Based on risk factors, this patient is High risk for the development of lung cancer.  I would recommend a 1210-monthllow-up with imaging to ensure stability given smoking history.  If imaging stable in 12-71-monthould recommend follow-up with annual low-dose CT screening. Will make referral if appropriate.    During our visit, we discussed pulmonary nodules are a common incidental finding and are often how lung cancer is discovered.  Lung cancer survival is directly related to the stage at diagnosis.  We discussed that nodules can vary in presentation from solitary pulmonary nodules to masses, 2 groundglass opacities and multiple nodules.  Pulmonary nodules in the majority of cases are benign but the probability of these becoming malignant cannot be undermined.  Early identification of malignant nodules  could  lead to early diagnosis and increased survival.   We discussed the probability of pulmonary nodules becoming malignant increase with age, pack years of tobacco use, size/characteristics of the nodule and location; with upper lobe involvement being most worrisome.   We discussed the goal of our clinic is to thoroughly evaluate each nodule, developed a comprehensive, individualized plan of care utilizing the most advanced technology and significantly reduce the time from detection to treatment.  A dedicated pulmonary nodule clinic has proven to indeed expedite the detection and treatment of lung cancer.   Patient education in fact sheet provided along with most recent CT scans.  Disposition:  RTC in approximately 12 months for CT chest w/o contrast.    Visit Diagnosis 1. Lung nodule     Patient expressed understanding and was in agreement with this plan. She also understands that She can call clinic at any time with any questions, concerns, or complaints.   Greater than 50% was spent in counseling and coordination of care with this patient including but not limited to discussion of the relevant topics above (See A&P) including, but not limited to diagnosis and management of acute and chronic medical conditions.   Thank you for allowing me to participate in the care of this very pleasant patient.    Jacquelin Hawking, NP [place] [facility] Cell - [phone] Pager- [phone] 03/23/2019 1:32 PM

## 2019-03-23 NOTE — Addendum Note (Signed)
Addended by: Faythe Casa E on: 03/23/2019 01:43 PM   Modules accepted: Orders

## 2019-03-27 ENCOUNTER — Ambulatory Visit: Payer: BLUE CROSS/BLUE SHIELD | Admitting: Oncology

## 2019-03-27 ENCOUNTER — Other Ambulatory Visit: Payer: BLUE CROSS/BLUE SHIELD

## 2019-03-28 ENCOUNTER — Telehealth: Payer: Self-pay | Admitting: Gastroenterology

## 2019-03-28 ENCOUNTER — Other Ambulatory Visit: Payer: BLUE CROSS/BLUE SHIELD

## 2019-03-28 NOTE — Telephone Encounter (Signed)
Returned patients call to reschedule her 04/24/19 colonoscopy due to insurance changes.  Informed her that I will call her back to reschedule in December for January as we are not scheduling for January yet.  Reminder created to call her in December.  Thanks [employer]

## 2019-03-28 NOTE — Telephone Encounter (Signed)
Pt left vm to r/s her procedure with Dr. Allen Norris

## 2019-03-29 ENCOUNTER — Ambulatory Visit: Payer: BLUE CROSS/BLUE SHIELD | Admitting: Oncology

## 2019-04-19 ENCOUNTER — Telehealth: Payer: Self-pay | Admitting: *Deleted

## 2019-04-19 NOTE — Telephone Encounter (Signed)
Pt has been made aware of upcoming appts for follow up CT scan and follow up appt with Jennifer Burns, NP in the Lung Nodule Clinic. Pt verbalized understanding. Nothing further needed at this time. 

## 2019-04-24 ENCOUNTER — Ambulatory Visit: Admit: 2019-04-24 | Payer: BC Managed Care – PPO | Admitting: Gastroenterology

## 2019-04-24 SURGERY — COLONOSCOPY WITH PROPOFOL
Anesthesia: Choice

## 2019-05-08 ENCOUNTER — Telehealth: Payer: Self-pay

## 2019-05-08 ENCOUNTER — Other Ambulatory Visit: Payer: Self-pay

## 2019-05-08 DIAGNOSIS — D126 Benign neoplasm of colon, unspecified: Secondary | ICD-10-CM

## 2019-05-08 NOTE — Telephone Encounter (Signed)
Gastroenterology Pre-Procedure Review  Request Date: 06/05/19 Requesting Physician: Dr. Allen Norris  PATIENT REVIEW QUESTIONS: The patient responded to the following health history questions as indicated:    1. Are you having any GI issues? no 2. Do you have a personal history of Polyps? no 3. Do you have a family history of Colon Cancer or Polyps? no 4. Diabetes Mellitus? no 5. Joint replacements in the past 12 months?no 6. Major health problems in the past 3 months?no 7. Any artificial heart valves, MVP, or defibrillator?no    MEDICATIONS & ALLERGIES:    Patient reports the following regarding taking any anticoagulation/antiplatelet therapy:   Plavix, Coumadin, Eliquis, Xarelto, Lovenox, Pradaxa, Brilinta, or Effient? no Aspirin? no  Patient confirms/reports the following medications:  Current Outpatient Medications  Medication Sig Dispense Refill  . loratadine (CLARITIN REDITABS) 10 MG dissolvable tablet Take 1 tablet by mouth daily.    . montelukast (SINGULAIR) 10 MG tablet Take 1 tablet (10 mg total) by mouth at bedtime. 90 tablet 3  . triamcinolone (NASACORT ALLERGY 24HR) 55 MCG/ACT AERO nasal inhaler Place 2 sprays into the nose daily.    . Vitamin D, Cholecalciferol, 50 MCG (2000 UT) CAPS Take by mouth daily.     No current facility-administered medications for this visit.    Patient confirms/reports the following allergies:  Allergies  Allergen Reactions  . Amoxicillin Itching    No orders of the defined types were placed in this encounter.   AUTHORIZATION INFORMATION Primary Insurance: 1D#: Group #:  Secondary Insurance: 1D#: Group #:  SCHEDULE INFORMATION: Date: 06/05/19 Time: Location:[facility]

## 2019-05-08 NOTE — Telephone Encounter (Signed)
-----   Message from Vanetta Mulders, [place] sent at 03/28/2019  9:40 AM EST ----- Regarding: R/S To January Patient has requested to reschedule her colonoscopy to January with Dr. Allen Norris at [facility]. Informed her that I will call her back in December to schedule with Dr. Allen Norris in January.  Thanks [employer]

## 2019-05-08 NOTE — Telephone Encounter (Signed)
Colonoscopy scheduled [place] 06/05/19 with Dr. Allen Norris.  Thanks [employer]

## 2019-05-24 ENCOUNTER — Encounter: Payer: Self-pay | Admitting: Gastroenterology

## 2019-05-24 ENCOUNTER — Other Ambulatory Visit: Payer: Self-pay

## 2019-05-31 ENCOUNTER — Other Ambulatory Visit: Admission: RE | Admit: 2019-05-31 | Payer: BC Managed Care – PPO | Source: Ambulatory Visit

## 2019-05-31 DIAGNOSIS — L820 Inflamed seborrheic keratosis: Secondary | ICD-10-CM | POA: Diagnosis not present

## 2019-05-31 DIAGNOSIS — D2272 Melanocytic nevi of left lower limb, including hip: Secondary | ICD-10-CM | POA: Diagnosis not present

## 2019-05-31 DIAGNOSIS — D225 Melanocytic nevi of trunk: Secondary | ICD-10-CM | POA: Diagnosis not present

## 2019-05-31 DIAGNOSIS — D2271 Melanocytic nevi of right lower limb, including hip: Secondary | ICD-10-CM | POA: Diagnosis not present

## 2019-05-31 DIAGNOSIS — D485 Neoplasm of uncertain behavior of skin: Secondary | ICD-10-CM | POA: Diagnosis not present

## 2019-06-01 ENCOUNTER — Other Ambulatory Visit
Admission: RE | Admit: 2019-06-01 | Discharge: 2019-06-01 | Disposition: A | Discharge status: Home / Self Care | Payer: BC Managed Care – PPO | Source: Ambulatory Visit | Attending: Gastroenterology | Admitting: Gastroenterology

## 2019-06-01 DIAGNOSIS — Z01812 Encounter for preprocedural laboratory examination: Secondary | ICD-10-CM | POA: Insufficient documentation

## 2019-06-01 DIAGNOSIS — Z20822 Contact with and (suspected) exposure to covid-19: Secondary | ICD-10-CM | POA: Diagnosis not present

## 2019-06-01 LAB — SARS CORONAVIRUS 2 (TAT 6-24 HRS): SARS Coronavirus 2: NEGATIVE

## 2019-06-01 NOTE — Discharge Instructions (Signed)
General Anesthesia, Adult, Care After This sheet gives you information about how to care for yourself after your procedure. Your health care provider may also give you more specific instructions. If you have problems or questions, contact your health care provider. What can I expect after the procedure? After the procedure, the following side effects are common:  Pain or discomfort at the IV site.  Nausea.  Vomiting.  Sore throat.  Trouble concentrating.  Feeling cold or chills.  Weak or tired.  Sleepiness and fatigue.  Soreness and body aches. These side effects can affect parts of the body that were not involved in surgery. Follow these instructions at home:  For at least 24 hours after the procedure:  Have a responsible adult stay with you. It is important to have someone help care for you until you are awake and alert.  Rest as needed.  Do not: ? Participate in activities in which you could fall or become injured. ? Drive. ? Use heavy machinery. ? Drink alcohol. ? Take sleeping pills or medicines that cause drowsiness. ? Make important decisions or sign legal documents. ? Take care of children on your own. Eating and drinking  Follow any instructions from your health care provider about eating or drinking restrictions.  When you feel hungry, start by eating small amounts of foods that are soft and easy to digest (bland), such as toast. Gradually return to your regular diet.  Drink enough fluid to keep your urine pale yellow.  If you vomit, rehydrate by drinking water, juice, or clear broth. General instructions  If you have sleep apnea, surgery and certain medicines can increase your risk for breathing problems. Follow instructions from your health care provider about wearing your sleep device: ? Anytime you are sleeping, including during daytime naps. ? While taking prescription pain medicines, sleeping medicines, or medicines that make you drowsy.  Return to  your normal activities as told by your health care provider. Ask your health care provider what activities are safe for you.  Take over-the-counter and prescription medicines only as told by your health care provider.  If you smoke, do not smoke without supervision.  Keep all follow-up visits as told by your health care provider. This is important. Contact a health care provider if:  You have nausea or vomiting that does not get better with medicine.  You cannot eat or drink without vomiting.  You have pain that does not get better with medicine.  You are unable to pass urine.  You develop a skin rash.  You have a fever.  You have redness around your IV site that gets worse. Get help right away if:  You have difficulty breathing.  You have chest pain.  You have blood in your urine or stool, or you vomit blood. Summary  After the procedure, it is common to have a sore throat or nausea. It is also common to feel tired.  Have a responsible adult stay with you for the first 24 hours after general anesthesia. It is important to have someone help care for you until you are awake and alert.  When you feel hungry, start by eating small amounts of foods that are soft and easy to digest (bland), such as toast. Gradually return to your regular diet.  Drink enough fluid to keep your urine pale yellow.  Return to your normal activities as told by your health care provider. Ask your health care provider what activities are safe for you. This information is not   intended to replace advice given to you by your health care provider. Make sure you discuss any questions you have with your health care provider. Document Revised: 04/30/2017 Document Reviewed: 12/11/2016 Elsevier Patient Education  2020 Elsevier Inc.  

## 2019-06-05 ENCOUNTER — Ambulatory Visit
Admission: RE | Admit: 2019-06-05 | Discharge: 2019-06-05 | Disposition: A | Discharge status: Home / Self Care | Payer: BC Managed Care – PPO | Attending: Gastroenterology | Admitting: Gastroenterology

## 2019-06-05 ENCOUNTER — Other Ambulatory Visit: Payer: Self-pay

## 2019-06-05 ENCOUNTER — Encounter: Payer: Self-pay | Admitting: Gastroenterology

## 2019-06-05 ENCOUNTER — Ambulatory Visit: Payer: BC Managed Care – PPO | Admitting: Anesthesiology

## 2019-06-05 ENCOUNTER — Encounter
Admission: RE | Disposition: A | Discharge status: Home / Self Care | Payer: Self-pay | Source: Home / Self Care | Attending: Gastroenterology

## 2019-06-05 DIAGNOSIS — Z79899 Other long term (current) drug therapy: Secondary | ICD-10-CM | POA: Insufficient documentation

## 2019-06-05 DIAGNOSIS — Z860100 Personal history of colon polyps, unspecified: Secondary | ICD-10-CM

## 2019-06-05 DIAGNOSIS — D123 Benign neoplasm of transverse colon: Secondary | ICD-10-CM | POA: Insufficient documentation

## 2019-06-05 DIAGNOSIS — K635 Polyp of colon: Secondary | ICD-10-CM | POA: Diagnosis not present

## 2019-06-05 DIAGNOSIS — F17210 Nicotine dependence, cigarettes, uncomplicated: Secondary | ICD-10-CM | POA: Insufficient documentation

## 2019-06-05 HISTORY — PX: COLONOSCOPY WITH PROPOFOL: SHX5780

## 2019-06-05 HISTORY — PX: POLYPECTOMY: SHX5525

## 2019-06-05 HISTORY — DX: Tinnitus, unspecified ear: H93.19

## 2019-06-05 SURGERY — COLONOSCOPY WITH PROPOFOL
Anesthesia: General | Site: Rectum

## 2019-06-05 MED ORDER — PROPOFOL 10 MG/ML IV BOLUS
INTRAVENOUS | Status: DC | PRN
  Administered 2019-06-05: 40 mg via INTRAVENOUS
  Administered 2019-06-05 (×2): 30 mg via INTRAVENOUS
  Administered 2019-06-05: 120 mg via INTRAVENOUS
  Administered 2019-06-05 (×2): 30 mg via INTRAVENOUS

## 2019-06-05 MED ORDER — STERILE WATER FOR IRRIGATION IR SOLN
Status: DC | PRN
  Administered 2019-06-05: 50 mL

## 2019-06-05 MED ORDER — LIDOCAINE HCL (CARDIAC) PF 100 MG/5ML IV SOSY
PREFILLED_SYRINGE | INTRAVENOUS | Status: DC | PRN
  Administered 2019-06-05: 30 mg via INTRAVENOUS

## 2019-06-05 MED ORDER — LACTATED RINGERS IV SOLN: INTRAVENOUS | Status: DC

## 2019-06-05 SURGICAL SUPPLY — 6 items
CANISTER SUCT 1200ML W/VALVE (MISCELLANEOUS) ×2 IMPLANT
FORCEPS BIOP RAD 4 LRG CAP 4 (CUTTING FORCEPS) ×1 IMPLANT
GOWN CVR UNV OPN BCK APRN NK (MISCELLANEOUS) ×2 IMPLANT
GOWN ISOL THUMB LOOP REG UNIV (MISCELLANEOUS) ×4
KIT ENDO PROCEDURE OLY (KITS) ×2 IMPLANT
WATER STERILE IRR 250ML POUR (IV SOLUTION) ×2 IMPLANT

## 2019-06-05 NOTE — Anesthesia Procedure Notes (Signed)
Performed by: Athea Haley, CRNA Pre-anesthesia Checklist: Patient identified, Emergency Drugs available, Suction available, Timeout performed and Patient being monitored Patient Re-evaluated:Patient Re-evaluated prior to induction Oxygen Delivery Method: Nasal cannula Placement Confirmation: positive ETCO2       

## 2019-06-05 NOTE — Transfer of Care (Signed)
Immediate Anesthesia Transfer of Care Note  Patient: Sharon [name]  Procedure(s) Performed: COLONOSCOPY WITH BIOPSIES (N/A Rectum) POLYPECTOMY (N/A Rectum)  Patient Location: PACU  Anesthesia Type: General  Level of Consciousness: awake, alert  and patient cooperative  Airway and Oxygen Therapy: Patient Spontanous Breathing and Patient connected to supplemental oxygen  Post-op Assessment: Post-op Vital signs reviewed, Patient's Cardiovascular Status Stable, Respiratory Function Stable, Patent Airway and No signs of Nausea or vomiting  Post-op Vital Signs: Reviewed and stable  Complications: No apparent anesthesia complications

## 2019-06-05 NOTE — H&P (Signed)
Lucilla Lame, MD [address] [place], [place] [address] Phone:[phone] Fax : [phone]  Primary Care Physician:  Glean Hess, MD Primary Gastroenterologist:  Dr. Allen Norris  Pre-Procedure History & Physical: HPI:  Sharon [name] is a 59 y.o. female is here for an colonoscopy.   Past Medical History:  Diagnosis Date  . Benign neoplasm of ascending colon   . BRCA negative 09/2016   My Risk neg  . Disease of nail 03/11/2015  . Encounter for nonprocreative genetic counseling   . Family history of breast cancer    and pancreatic  . 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  No issues  . Headache    sinus, seasonal  . Increased risk of breast cancer 09/2016   IBIS=17.0%/riskscore=22.4%  . Numbness in feet    s/p foot and back surgery  . PONV (postoperative nausea and vomiting)   . Rosacea   . Special screening for malignant neoplasms, colon   . Tendinitis of wrist 03/11/2015  . Tinnitus August    Past Surgical History:  Procedure Laterality Date  . BUNIONECTOMY Bilateral   . COLONOSCOPY WITH PROPOFOL N/A 03/12/2016   Procedure: COLONOSCOPY WITH PROPOFOL;  Surgeon: Lucilla Lame, MD;  Location: [facility];  Service: Endoscopy;  Laterality: N/A;  . LUMBAR [place]    . POLYPECTOMY  03/12/2016   Procedure: POLYPECTOMY;  Surgeon: Lucilla Lame, MD;  Location: [facility];  Service: Endoscopy;;    Prior to Admission medications   Medication Sig Start Date End Date Taking? Authorizing Provider  doxycycline (ORACEA) 40 MG capsule Take 20 mg by mouth every morning.   Yes [provider]  loratadine (CLARITIN REDITABS) 10 MG dissolvable tablet Take 1 tablet by mouth daily.   Yes [provider]  montelukast (SINGULAIR) 10 MG tablet Take 1 tablet (10 mg total) by mouth at bedtime. 09/14/18  Yes Glean Hess, MD  Multiple Vitamin (MULTIVITAMIN) capsule Take 1 capsule by mouth daily.   Yes [provider]    triamcinolone (NASACORT ALLERGY 24HR) 55 MCG/ACT AERO nasal inhaler Place 2 sprays into the nose daily.   Yes [provider]  Vitamin D, Cholecalciferol, 50 MCG (2000 UT) CAPS Take 1,000 Units by mouth daily.    Yes [provider]    Allergies as of 05/08/2019 - Review Complete 03/14/2019  Allergen Reaction Noted  . Amoxicillin Itching 09/20/2015    Family History  Problem Relation Age of Onset  . Breast cancer Maternal Grandmother 60       twice, bilat breast--88  . Pancreatic cancer Maternal Uncle 60  . Breast cancer Other   . Breast cancer Other   . Ovarian cancer Other     Social History   Socioeconomic History  . Marital status: Married    Spouse name: Not on file  . Number of children: Not on file  . Years of education: Not on file  . Highest education level: Not on file  Occupational History  . Not on file  Tobacco Use  . Smoking status: Current Every Day Smoker    Packs/day: 1.00    Years: 40.00    Pack years: 40.00    Types: Cigarettes  . Smokeless tobacco: Never Used  Substance and Sexual Activity  . Alcohol use: Yes    Alcohol/week: 2.0 standard drinks    Types: 2 Standard drinks or equivalent per week  . Drug use: No  . Sexual activity: Yes  Other Topics Concern  . Not on file  Social History Narrative  . Not on file   Social Determinants of Health   Financial Resource Strain:   . Difficulty of Paying Living Expenses: Not on file  Food Insecurity:   . Worried About Charity fundraiser in the Last Year: Not on file  . Ran Out of Food in the Last Year: Not on file  Transportation Needs:   . Lack of Transportation (Medical): Not on file  . Lack of Transportation (Non-Medical): Not on file  Physical Activity:   . Days of Exercise per Week: Not on file  . Minutes of Exercise per Session: Not on file  Stress:   . Feeling of Stress : Not on file  Social Connections:   . Frequency of Communication with Friends and Family: Not on  file  . Frequency of Social Gatherings with Friends and Family: Not on file  . Attends Religious Services: Not on file  . Active Member of Clubs or Organizations: Not on file  . Attends Archivist Meetings: Not on file  . Marital Status: Not on file  Intimate Partner Violence:   . Fear of Current or Ex-Partner: Not on file  . Emotionally Abused: Not on file  . Physically Abused: Not on file  . Sexually Abused: Not on file    Review of Systems: See HPI, otherwise negative ROS  Physical Exam: BP 124/77   Pulse 75   Temp 97.8 F (36.6 C) (Temporal)   Resp 16   Ht 5' 10"  (1.778 m)   Wt 87.5 kg   SpO2 99%   BMI 27.69 kg/m  General:   Alert,  pleasant and cooperative in NAD Head:  Normocephalic and atraumatic. Neck:  Supple; no masses or thyromegaly. Lungs:  Clear throughout to auscultation.    Heart:  Regular rate and rhythm. Abdomen:  Soft, nontender and nondistended. Normal bowel sounds, without guarding, and without rebound.   Neurologic:  Alert and  oriented x4;  grossly normal neurologically.  Impression/Plan: Sharon [name] is here for an colonoscopy to be performed for history of adenomatous polyps 03/12/2016  Risks, benefits, limitations, and alternatives regarding  colonoscopy have been reviewed with the patient.  Questions have been answered.  All parties agreeable.   Lucilla Lame, MD  06/05/2019, 7:48 AM

## 2019-06-05 NOTE — Anesthesia Preprocedure Evaluation (Signed)
Anesthesia Evaluation    Airway Mallampati: III  TM Distance: >3 FB Neck ROM: Full    Dental no notable dental hx.    Pulmonary Current SmokerPatient did not abstain from smoking.,  Multiple known lung nodules   Pulmonary exam normal        Cardiovascular Exercise Tolerance: Good (-) anginanegative cardio ROS Normal cardiovascular exam     Neuro/Psych negative neurological ROS  negative psych ROS   GI/Hepatic negative GI ROS, Neg liver ROS,   Endo/Other  negative endocrine ROS  Renal/GU negative Renal ROS     Musculoskeletal   Abdominal   Peds  Hematology negative hematology ROS (+)   Anesthesia Other Findings   Reproductive/Obstetrics                            Anesthesia Physical Anesthesia Plan  ASA: II  Anesthesia Plan: General   Post-op Pain Management:    Induction: Intravenous  PONV Risk Score and Plan: 2 and Propofol infusion, TIVA and Treatment may vary due to age or medical condition  Airway Management Planned: Nasal Cannula and Natural Airway  Additional Equipment: None  Intra-op Plan:   Post-operative Plan:   Informed Consent: I have reviewed the patients History and Physical, chart, labs and discussed the procedure including the risks, benefits and alternatives for the proposed anesthesia with the patient or authorized representative who has indicated his/her understanding and acceptance.       Plan Discussed with: CRNA  Anesthesia Plan Comments:         Anesthesia Quick Evaluation

## 2019-06-05 NOTE — Anesthesia Postprocedure Evaluation (Signed)
Anesthesia Post Note  Patient: Sharon [name]  Procedure(s) Performed: COLONOSCOPY WITH BIOPSIES (N/A Rectum) POLYPECTOMY (N/A Rectum)     Patient location during evaluation: PACU Anesthesia Type: General Level of consciousness: awake and alert Pain management: pain level controlled Vital Signs Assessment: post-procedure vital signs reviewed and stable Respiratory status: spontaneous breathing, nonlabored ventilation, respiratory function stable and patient connected to nasal cannula oxygen Cardiovascular status: blood pressure returned to baseline and stable Postop Assessment: no apparent nausea or vomiting Anesthetic complications: no    Adele Barthel Norah Fick

## 2019-06-05 NOTE — Op Note (Signed)
[facility] Gastroenterology Patient Name: Sharon [name] Procedure Date: 06/05/2019 8:29 AM MRN: [id number] Account #: [id number] Date of Birth: Sep 21, 1960 Admit Type: Outpatient Age: 59 Room: [facility] OR ROOM 01 Gender: Female Note Status: Finalized Procedure:             Colonoscopy Indications:           High risk colon cancer surveillance: Personal history                         of colonic polyps Providers:             Lucilla Lame MD, MD Referring MD:          Halina Maidens, MD (Referring MD) Medicines:             Propofol per Anesthesia Complications:         No immediate complications. Procedure:             Pre-Anesthesia Assessment:                        - Prior to the procedure, a History and Physical was                         performed, and patient medications and allergies were                         reviewed. The patient's tolerance of previous                         anesthesia was also reviewed. The risks and benefits                         of the procedure and the sedation options and risks                         were discussed with the patient. All questions were                         answered, and informed consent was obtained. Prior                         Anticoagulants: The patient has taken no previous                         anticoagulant or antiplatelet agents. ASA Grade                         Assessment: II - A patient with mild systemic disease.                         After reviewing the risks and benefits, the patient                         was deemed in satisfactory condition to undergo the                         procedure.  After obtaining informed consent, the colonoscope was                         passed under direct vision. Throughout the procedure,                         the patient's blood pressure, pulse, and oxygen                         saturations were monitored continuously. The was                   introduced through the anus and advanced to the the                         cecum, identified by appendiceal orifice and ileocecal                         valve. The colonoscopy was performed without                         difficulty. The patient tolerated the procedure well.                         The quality of the bowel preparation was excellent. Findings:      The perianal and digital rectal examinations were normal.      A 2 mm polyp was found in the cecum. The polyp was sessile. The polyp       was removed with a cold biopsy forceps. Resection and retrieval were       complete.      A 3 mm polyp was found in the transverse colon. The polyp was sessile.       The polyp was removed with a cold biopsy forceps. Resection and       retrieval were complete. Impression:            - One 2 mm polyp in the cecum, removed with a cold                         biopsy forceps. Resected and retrieved.                        - One 3 mm polyp in the transverse colon, removed with                         a cold biopsy forceps. Resected and retrieved. Recommendation:        - Discharge patient to home.                        - Resume previous diet.                        - Continue present medications.                        - Await pathology results.                        - Repeat colonoscopy in 5 years for surveillance. Procedure Code(s):     ---  Professional ---                        [phone], Colonoscopy, flexible; with biopsy, single or                         multiple Diagnosis Code(s):     --- Professional ---                        Z86.010, Personal history of colonic polyps                        K63.5, Polyp of colon CPT copyright 2019 American Medical Association. All rights reserved. The codes documented in this report are preliminary and upon coder review may  be revised to meet current compliance requirements. Lucilla Lame MD, MD 06/05/2019 8:49:31 AM This report has  been signed electronically. Number of Addenda: 0 Note Initiated On: 06/05/2019 8:29 AM Scope Withdrawal Time: 0 hours 7 minutes 13 seconds  Total Procedure Duration: 0 hours 11 minutes 47 seconds  Estimated Blood Loss:  Estimated blood loss: none.      [facility]

## 2019-06-06 ENCOUNTER — Encounter: Payer: Self-pay | Admitting: Gastroenterology

## 2019-06-06 ENCOUNTER — Encounter: Payer: Self-pay | Admitting: *Deleted

## 2019-06-08 ENCOUNTER — Encounter: Payer: Self-pay | Admitting: Gastroenterology

## 2019-06-15 DIAGNOSIS — H40003 Preglaucoma, unspecified, bilateral: Secondary | ICD-10-CM | POA: Diagnosis not present

## 2019-07-12 DIAGNOSIS — H40003 Preglaucoma, unspecified, bilateral: Secondary | ICD-10-CM | POA: Diagnosis not present

## 2019-07-14 DIAGNOSIS — Z23 Encounter for immunization: Secondary | ICD-10-CM | POA: Diagnosis not present

## 2019-07-31 DIAGNOSIS — Z23 Encounter for immunization: Secondary | ICD-10-CM | POA: Diagnosis not present

## 2019-08-09 DIAGNOSIS — G5782 Other specified mononeuropathies of left lower limb: Secondary | ICD-10-CM | POA: Diagnosis not present

## 2019-11-06 ENCOUNTER — Other Ambulatory Visit: Payer: Self-pay | Admitting: Otolaryngology

## 2019-11-06 ENCOUNTER — Other Ambulatory Visit (HOSPITAL_COMMUNITY): Payer: Self-pay | Admitting: Otolaryngology

## 2019-11-06 DIAGNOSIS — E041 Nontoxic single thyroid nodule: Secondary | ICD-10-CM

## 2019-11-06 DIAGNOSIS — H6122 Impacted cerumen, left ear: Secondary | ICD-10-CM | POA: Diagnosis not present

## 2019-11-11 ENCOUNTER — Other Ambulatory Visit: Payer: Self-pay | Admitting: Internal Medicine

## 2019-11-11 DIAGNOSIS — J3089 Other allergic rhinitis: Secondary | ICD-10-CM

## 2019-11-11 NOTE — Telephone Encounter (Signed)
Requested Prescriptions  Pending Prescriptions Disp Refills  . montelukast (SINGULAIR) 10 MG tablet [Pharmacy Med Name: MONTELUKAST SOD 10 MG TABLET] 90 tablet 1    Sig: TAKE 1 TABLET BY MOUTH EVERYDAY AT BEDTIME     Pulmonology:  Leukotriene Inhibitors Passed - 11/11/2019  1:04 AM      Passed - Valid encounter within last 12 months    Recent Outpatient Visits          8 months ago Annual physical exam   [facility] Glean Hess, MD   1 year ago H/O multiple pulmonary nodules   [facility] Glean Hess, MD   1 year ago Annual physical exam   [facility] Glean Hess, MD   2 years ago Environmental and seasonal allergies   [facility] Glean Hess, MD   3 years ago Environmental and seasonal allergies   [facility] Glean Hess, MD      Future Appointments            In 4 months Army Melia Jesse Sans, MD [facility], [facility]

## 2019-11-15 ENCOUNTER — Encounter: Payer: Self-pay | Admitting: Internal Medicine

## 2019-11-15 ENCOUNTER — Ambulatory Visit (INDEPENDENT_AMBULATORY_CARE_PROVIDER_SITE_OTHER): Payer: BC Managed Care – PPO | Admitting: Internal Medicine

## 2019-11-15 ENCOUNTER — Other Ambulatory Visit: Payer: Self-pay

## 2019-11-15 VITALS — BP 136/82 | HR 76 | Temp 97.9°F | Ht 70.0 in | Wt 197.0 lb

## 2019-11-15 DIAGNOSIS — H1033 Unspecified acute conjunctivitis, bilateral: Secondary | ICD-10-CM

## 2019-11-15 DIAGNOSIS — W57XXXA Bitten or stung by nonvenomous insect and other nonvenomous arthropods, initial encounter: Secondary | ICD-10-CM

## 2019-11-15 DIAGNOSIS — Z23 Encounter for immunization: Secondary | ICD-10-CM | POA: Diagnosis not present

## 2019-11-15 MED ORDER — NEOMYCIN-POLYMYXIN-DEXAMETH 3.5-10000-0.1 OP SUSP: 2.0000 [drp] | Freq: Four times a day (QID) | OPHTHALMIC | 0 refills | Status: AC

## 2019-11-15 NOTE — Progress Notes (Signed)
Date:  11/15/2019   Name:  Sharon [name]   DOB:  [birth year]   MRN:  [id number]   Chief Complaint: Herpes Zoster (eyes swollen, spots on stomach, back, legs, face, itching, painful, red )  Rash This is a new problem. Episode onset: 2 days ago. The problem has been gradually improving since onset. The affected locations include the abdomen, left upper leg and face. The rash is characterized by itchiness and redness. Associated with: has been working outside but not aware of mosquitos etc. Pertinent negatives include no fatigue, fever or shortness of breath.  Eye Problem  Both eyes are affected.This is a new problem. The current episode started in the past 7 days. There was no injury mechanism. The pain is mild. Associated symptoms include a foreign body sensation and itching. Pertinent negatives include no eye discharge or fever. Associated symptoms comments: And puffiness of the lids.    Lab Results  Component Value Date   CREATININE 0.67 03/14/2019   BUN 8 03/14/2019   NA 143 03/14/2019   K 4.7 03/14/2019   CL 104 03/14/2019   CO2 27 03/14/2019   Lab Results  Component Value Date   CHOL 227 (H) 03/14/2019   HDL 60 03/14/2019   LDLCALC 148 (H) 03/14/2019   TRIG 107 03/14/2019   CHOLHDL 3.8 03/14/2019   Lab Results  Component Value Date   TSH 1.490 03/14/2019   No results found for: HGBA1C Lab Results  Component Value Date   WBC 9.8 03/14/2019   HGB 14.1 03/14/2019   HCT 41.8 03/14/2019   MCV 94 03/14/2019   PLT 318 03/14/2019   Lab Results  Component Value Date   ALT 15 03/14/2019   AST 13 03/14/2019   ALKPHOS 118 (H) 03/14/2019   BILITOT 0.3 03/14/2019     Review of Systems  Constitutional: Negative for chills, fatigue and fever.  Eyes: Positive for itching. Negative for discharge.  Respiratory: Negative for chest tightness and shortness of breath.   Cardiovascular: Negative for chest pain and palpitations.  Skin: Positive for rash.    Patient Active  Problem List   Diagnosis Date Noted  . Personal history of colonic polyps   . Polyp of transverse colon   . Mixed hyperlipidemia 03/11/2018  . Multiple pulmonary nodules 03/11/2018  . Family history of breast cancer   . Vitamin D deficiency 09/28/2016  . Increased risk of breast cancer 09/08/2016  . Environmental and seasonal allergies 05/22/2016  . Polyp of sigmoid colon   . Pain in shoulder 03/11/2015  . Muscle spasms of neck 03/11/2015  . Acne erythematosa 03/11/2015  . Seborrhea capitis 03/11/2015  . 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 Compulsive tobacco user syndrome 03/11/2015    Allergies  Allergen Reactions  . Amoxicillin Itching    Past Surgical History:  Procedure Laterality Date  . BUNIONECTOMY Bilateral   . COLONOSCOPY WITH PROPOFOL N/A 03/12/2016   Procedure: COLONOSCOPY WITH PROPOFOL;  Surgeon: Lucilla Lame, MD;  Location: [facility];  Service: Endoscopy;  Laterality: N/A;  . COLONOSCOPY WITH PROPOFOL N/A 06/05/2019   Procedure: COLONOSCOPY WITH BIOPSIES;  Surgeon: Lucilla Lame, MD;  Location: [facility];  Service: Endoscopy;  Laterality: N/A;  . LUMBAR [place]    . POLYPECTOMY  03/12/2016   Procedure: POLYPECTOMY;  Surgeon: Lucilla Lame, MD;  Location: [facility];  Service: Endoscopy;;  . POLYPECTOMY N/A 06/05/2019   Procedure: POLYPECTOMY;  Surgeon: Lucilla Lame, MD;  Location: [facility];  Service:  Endoscopy;  Laterality: N/A;    Social History   Tobacco Use  . Smoking status: Current Every Day Smoker    Packs/day: 1.00    Years: 40.00    Pack years: 40.00    Types: Cigarettes  . Smokeless tobacco: Never Used  Vaping Use  . Vaping Use: Never used  Substance Use Topics  . Alcohol use: Yes    Alcohol/week: 2.0 standard drinks    Types: 2 Standard drinks or equivalent per week  . Drug use: No     Medication list has been reviewed and updated.  Current Meds  Medication Sig  . Ciclopirox 1 % shampoo Apply topically 3  times/day as needed-between meals & bedtime.   Marland Kitchen doxycycline (ORACEA) 40 MG capsule Take 20 mg by mouth every morning.  . loratadine (CLARITIN REDITABS) 10 MG dissolvable tablet Take 1 tablet by mouth daily.  . montelukast (SINGULAIR) 10 MG tablet TAKE 1 TABLET BY MOUTH EVERYDAY AT BEDTIME  . Multiple Vitamin (MULTIVITAMIN) capsule Take 1 capsule by mouth daily.  Marland Kitchen triamcinolone (NASACORT ALLERGY 24HR) 55 MCG/ACT AERO nasal inhaler Place 2 sprays into the nose daily.  . Vitamin D, Cholecalciferol, 50 MCG (2000 UT) CAPS Take 1,000 Units by mouth daily.     PHQ 2/9 Scores 11/15/2019 03/14/2019 09/14/2018 03/11/2018  PHQ - 2 Score 0 0 0 0  PHQ- 9 Score 0 2 - -    GAD 7 : Generalized Anxiety Score 11/15/2019  Nervous, Anxious, on Edge 0  Control/stop worrying 0  Worry too much - different things 0  Trouble relaxing 0  Restless 0  Easily annoyed or irritable 0  Afraid - awful might happen 0  Total GAD 7 Score 0  Anxiety Difficulty Not difficult at all    BP Readings from Last 3 Encounters:  11/15/19 136/82  06/05/19 (!) 106/51  03/14/19 104/66    Physical Exam Vitals and nursing note reviewed.  Constitutional:      General: She is not in acute distress.    Appearance: She is well-developed.  HENT:     Head: Normocephalic and atraumatic.  Eyes:     Extraocular Movements: Extraocular movements intact.     Conjunctiva/sclera:     Right eye: Right conjunctiva is not injected. No chemosis.    Left eye: Left conjunctiva is not injected. No chemosis.    Comments: Eyelids puffy above and below but no redness crusting  Neck:     Vascular: Carotid bruit present.  Cardiovascular:     Rate and Rhythm: Normal rate.     Pulses: Normal pulses.     Heart sounds: No murmur heard.   Pulmonary:     Effort: Pulmonary effort is normal. No respiratory distress.     Breath sounds: No wheezing or rhonchi.  Musculoskeletal:     Cervical back: Normal range of motion.  Skin:    General: Skin is  warm and dry.     Comments: 5 lesions in total - 3 on abdomen, the initial one almost resolved on left upper abdomen. 1 on inner left thigh 1 on left side of face All appear to be insect bites with tiny central raised papule and mild surrounding erythema of 1 cm  Neurological:     Mental Status: She is alert and oriented to person, place, and time.  Psychiatric:        Behavior: Behavior normal.        Thought Content: Thought content normal.     Wt  Readings from Last 3 Encounters:  11/15/19 197 lb (89.4 kg)  06/05/19 193 lb (87.5 kg)  03/14/19 201 lb (91.2 kg)    BP 136/82   Pulse 76   Temp 97.9 F (36.6 C) (Oral)   Ht 5\' 10"  (1.778 m)   Wt 197 lb (89.4 kg)   SpO2 98%   BMI 28.27 kg/m   Assessment and Plan: 1. Acute conjunctivitis of both eyes, unspecified acute conjunctivitis type Recommend cool compresses as needed - neomycin-polymyxin b-dexamethasone (MAXITROL) 3.5-10000-0.1 SUSP; Place 2 drops into both eyes every 6 (six) hours for 10 days.  Dispense: 5 mL; Refill: 0  2. Bug bite, initial encounter Pt is reassured - can use topical cortisone as needed for itching  3. Need for shingles vaccine First dose today - Varicella-zoster vaccine IM   Partially dictated using Editor, commissioning. Any errors are unintentional.  Halina Maidens, MD [facility]  11/15/2019

## 2019-11-20 ENCOUNTER — Ambulatory Visit
Admission: RE | Admit: 2019-11-20 | Discharge: 2019-11-20 | Disposition: A | Discharge status: Home / Self Care | Payer: BC Managed Care – PPO | Source: Ambulatory Visit | Attending: Otolaryngology | Admitting: Otolaryngology

## 2019-11-20 ENCOUNTER — Other Ambulatory Visit: Payer: Self-pay

## 2019-11-20 DIAGNOSIS — E041 Nontoxic single thyroid nodule: Secondary | ICD-10-CM | POA: Diagnosis not present

## 2019-11-20 DIAGNOSIS — E042 Nontoxic multinodular goiter: Secondary | ICD-10-CM | POA: Diagnosis not present

## 2019-11-21 ENCOUNTER — Other Ambulatory Visit: Payer: Self-pay | Admitting: Otolaryngology

## 2019-11-21 DIAGNOSIS — E041 Nontoxic single thyroid nodule: Secondary | ICD-10-CM

## 2019-11-28 ENCOUNTER — Ambulatory Visit
Admission: RE | Admit: 2019-11-28 | Discharge: 2019-11-28 | Disposition: A | Discharge status: Home / Self Care | Payer: BC Managed Care – PPO | Source: Ambulatory Visit | Attending: Otolaryngology | Admitting: Otolaryngology

## 2019-11-28 ENCOUNTER — Ambulatory Visit: Payer: BC Managed Care – PPO

## 2019-11-28 ENCOUNTER — Other Ambulatory Visit: Payer: Self-pay

## 2019-11-28 DIAGNOSIS — D34 Benign neoplasm of thyroid gland: Secondary | ICD-10-CM | POA: Insufficient documentation

## 2019-11-28 DIAGNOSIS — E041 Nontoxic single thyroid nodule: Secondary | ICD-10-CM

## 2019-11-28 NOTE — Discharge Instructions (Signed)
Thyroid Needle Biopsy, Care After This sheet gives you information about how to care for yourself after your procedure. Your health care provider may also give you more specific instructions. If you have problems or questions, contact your health care provider. What can I expect after the procedure? After the procedure, it is common to have:  Soreness and tenderness that lasts for a few days.  Bruising where the needle was inserted (puncture site). Follow these instructions at home:   Take over-the-counter and prescription medicines only as told by your health care provider.  To help ease discomfort, keep your head raised (elevated) when you are lying down. When you move from lying down to sitting up, use both hands to support the back of your head and neck.  Check your puncture site every day for signs of infection. Check for: ? Redness, swelling, or pain. ? Fluid or blood. ? Warmth. ? Pus or a bad smell.  Return to your normal activities as told by your health care provider. Ask your health care provider what activities are safe for you.  Keep all follow-up visits as told by your health care provider. This is important. Contact a health care provider if:  You have redness, swelling, or pain around your puncture site.  You have fluid or blood coming from your puncture site.  Your puncture site feels warm to the touch.  You have pus or a bad smell coming from your puncture site.  You have a fever. Get help right away if:  You have severe bleeding from the puncture site.  You have difficulty swallowing.  You have swollen glands (lymph nodes) in your neck. Summary  It is common to have some bruising and soreness where the needle was inserted in your lower front neck area (puncture site).  Check your puncture site every day for signs of infection, such as redness, swelling, or pain.  Get help right away if you have severe bleeding from your puncture site. This  information is not intended to replace advice given to you by your health care provider. Make sure you discuss any questions you have with your health care provider. Document Revised: 04/09/2017 Document Reviewed: 02/08/2017 Elsevier Patient Education  2020 [employer]  

## 2019-11-29 LAB — CYTOLOGY - NON PAP

## 2019-12-13 DIAGNOSIS — H40003 Preglaucoma, unspecified, bilateral: Secondary | ICD-10-CM | POA: Diagnosis not present

## 2020-02-26 ENCOUNTER — Encounter: Payer: Self-pay | Admitting: Internal Medicine

## 2020-03-13 DIAGNOSIS — D485 Neoplasm of uncertain behavior of skin: Secondary | ICD-10-CM | POA: Diagnosis not present

## 2020-03-13 DIAGNOSIS — D225 Melanocytic nevi of trunk: Secondary | ICD-10-CM | POA: Diagnosis not present

## 2020-03-13 DIAGNOSIS — L820 Inflamed seborrheic keratosis: Secondary | ICD-10-CM | POA: Diagnosis not present

## 2020-03-13 DIAGNOSIS — L718 Other rosacea: Secondary | ICD-10-CM | POA: Diagnosis not present

## 2020-03-13 DIAGNOSIS — D2272 Melanocytic nevi of left lower limb, including hip: Secondary | ICD-10-CM | POA: Diagnosis not present

## 2020-03-15 ENCOUNTER — Encounter: Payer: BC Managed Care – PPO | Admitting: Internal Medicine

## 2020-03-21 ENCOUNTER — Other Ambulatory Visit: Payer: Self-pay

## 2020-03-21 ENCOUNTER — Ambulatory Visit
Admission: RE | Admit: 2020-03-21 | Discharge: 2020-03-21 | Disposition: A | Discharge status: Home / Self Care | Payer: BC Managed Care – PPO | Source: Ambulatory Visit | Attending: Oncology | Admitting: Oncology

## 2020-03-21 DIAGNOSIS — K449 Diaphragmatic hernia without obstruction or gangrene: Secondary | ICD-10-CM | POA: Diagnosis not present

## 2020-03-21 DIAGNOSIS — R911 Solitary pulmonary nodule: Secondary | ICD-10-CM | POA: Diagnosis not present

## 2020-03-21 DIAGNOSIS — K8080 Other cholelithiasis without obstruction: Secondary | ICD-10-CM | POA: Diagnosis not present

## 2020-03-21 DIAGNOSIS — I2510 Atherosclerotic heart disease of native coronary artery without angina pectoris: Secondary | ICD-10-CM | POA: Diagnosis not present

## 2020-03-21 DIAGNOSIS — J432 Centrilobular emphysema: Secondary | ICD-10-CM | POA: Diagnosis not present

## 2020-03-22 ENCOUNTER — Ambulatory Visit: Payer: BC Managed Care – PPO | Admitting: Nurse Practitioner

## 2020-03-26 ENCOUNTER — Inpatient Hospital Stay: Payer: BC Managed Care – PPO | Attending: Oncology | Admitting: Oncology

## 2020-03-26 ENCOUNTER — Other Ambulatory Visit: Payer: Self-pay

## 2020-03-26 DIAGNOSIS — Z87891 Personal history of nicotine dependence: Secondary | ICD-10-CM

## 2020-03-26 DIAGNOSIS — R911 Solitary pulmonary nodule: Secondary | ICD-10-CM

## 2020-03-26 NOTE — Progress Notes (Signed)
Pulmonary Nodule Clinic Consult note [facility]  [phone] [phone]  Patient Care Team: Glean Hess, MD as PCP - General (Internal Medicine) Grier Rocher, MD as Referring Physician (Dermatology) Rosina Lowenstein, MD (Obstetrics and Gynecology)   Name of the patient: Sharon [name]  [id number]  1960-09-15   Date of visit: 03/26/2020   Diagnosis- Lung Nodule follow-up  Virtual Visit via Telephone Note   I connected with Sharon [name] on 03/26/20 at @ 1pm  by telephone visit and verified that I am speaking with the correct person using two identifiers.   I discussed the limitations, risks, security and privacy concerns of performing an evaluation and management service by telemedicine and the availability of in-person appointments. I also discussed with the patient that there may be a patient responsible charge related to this service. The patient expressed understanding and agreed to proceed.   Other persons participating in the visit and their role in the encounter:   Patient's location: Home  Provider's location: Office  Chief complaint/ Reason for visit- Pulmonary Nodule Clinic f/u visit.   Past Medical History: Patient was initially evaluated by Dr. Army Melia to follow-up on a CT scan completed in November 2019.  Scan revealed multiple new pulmonary nodules throughout both lungs with the largest measuring 6 mm in the right middle lobe.  This was compared to previous imaging from 06/23/2014 which showed a 4 mm lung nodule in the left lung apex and 4 mm nodule within the right lung apex.  Per Fleischner's guidelines a CT scan without contrast was recommended within 3 to 6 months.  Follow-up CT completed on 09/21/2018 revealed persistent subsolid nodule of the right lower lobe greater than 6 mm.  Size remain unchanged.  Multiple bilateral subpleural lymph nodes/nodules none of which have enlarged in the interval, the left lower  lobe posteriorly appears to have decreased.  Unchanged appearance of left apical small nodule given her history of smoking it was recommended for her to have a 60-monthfollow-up.  CT chest without contrast on 03/21/19 revealed a 5 x 10 mm irregular parenchymal lesion in the right lower lobe that is stable since 09/21/2018.  625-monthmaging stability is reassuring.  Follow-up CT chest without contrast is recommended in 12 months to confirm stability.  Incidental finding of bilateral adrenal nodules which are stable.  Right adrenal nodules consistent with adenoma.  Left adrenal nodule has attenuation too high to be characterized as an adenoma.  Given the 6-39-monthability of the left adrenal nodule this is likely a lipid poor adenoma.  Per medical records, patient has family history of breast, ovarian and pancreatic cancer, personal history of vitamin D deficiency, seasonal allergies, polyp of sigmoid colon, hyperlipidemia, tobacco abuse and arthralgias.  Her past medical history is positive for: Past Medical History:  Diagnosis Date  . Benign neoplasm of ascending colon   . BRCA negative 09/2016   My Risk neg  . Disease of nail 03/11/2015  . Encounter for nonprocreative genetic counseling   . Family history of breast cancer    and pancreatic  . 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  No issues  . Headache    sinus, seasonal  . Increased risk of breast cancer 09/2016   IBIS=17.0%/riskscore=22.4%  . Numbness in feet    s/p foot and back surgery  . PONV (postoperative nausea and vomiting)   . Rosacea   . Special screening for malignant neoplasms, colon   .  Tendinitis of wrist 03/11/2015  . Tinnitus August   Her past surgical history is positive for: Past Surgical History:  Procedure Laterality Date  . BUNIONECTOMY Bilateral   . COLONOSCOPY WITH PROPOFOL N/A 03/12/2016   Procedure: COLONOSCOPY WITH PROPOFOL;  Surgeon: Lucilla Lame, MD;  Location: [place];  Service:  Endoscopy;  Laterality: N/A;  . COLONOSCOPY WITH PROPOFOL N/A 06/05/2019   Procedure: COLONOSCOPY WITH BIOPSIES;  Surgeon: Lucilla Lame, MD;  Location: [place];  Service: Endoscopy;  Laterality: N/A;  . LUMBAR [place]    . POLYPECTOMY  03/12/2016   Procedure: POLYPECTOMY;  Surgeon: Lucilla Lame, MD;  Location: [place];  Service: Endoscopy;;  . POLYPECTOMY N/A 06/05/2019   Procedure: POLYPECTOMY;  Surgeon: Lucilla Lame, MD;  Location: [facility];  Service: Endoscopy;  Laterality: N/A;   Interval history-since her last visit here at the lung nodule clinic she has been doing well.  She continues to smoke daily approximately 1 pack/day for the past 35 to 40 years.  She is currently not ready to quit.  She is currently still employed and working as a Government social research officer at [employer]  Due to COVID-19 she is currently working from home.  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  She had various other textile jobs.  She denies any direct contact with toxic chemicals known to cause cancer.  In interim, she has done well.  She had a colonoscopy by Dr. Verl Blalock on 06/05/2019.  She had 2 polyps one was 2 mm and one was 3 mm that were removed.She denies any recent fevers or illness, chest pain, shortness of breath, nausea, vomiting, constipation or diarrhea.  She denies any appetite changes or unexplained weight loss.  She denies any urinary concerns.  ECOG FS:0 - Asymptomatic  Review of systems- Review of Systems  Constitutional: Negative.  Negative for chills, fever, malaise/fatigue and weight loss.  HENT: Negative for congestion, ear pain and tinnitus.   Eyes: Negative.  Negative for blurred vision and double vision.  Respiratory: Negative.  Negative for cough, sputum production and shortness of breath.   Cardiovascular: Negative.  Negative for chest pain, palpitations and leg swelling.   Gastrointestinal: Negative.  Negative for abdominal pain, constipation, diarrhea, nausea and vomiting.  Genitourinary: Negative for dysuria, frequency and urgency.  Musculoskeletal: Negative for back pain and falls.  Skin: Negative.  Negative for rash.  Neurological: Negative.  Negative for weakness and headaches.  Endo/Heme/Allergies: Negative.  Does not bruise/bleed easily.  Psychiatric/Behavioral: Negative.  Negative for depression. The patient is not nervous/anxious and does not have insomnia.      Allergies  Allergen Reactions  . Amoxicillin Itching     Past Medical History:  Diagnosis Date  . Benign neoplasm of ascending colon   . BRCA negative 09/2016   My Risk neg  . Disease of nail 03/11/2015  . Encounter for nonprocreative genetic counseling   . Family history of breast cancer    and pancreatic  . 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  No issues  . Headache    sinus, seasonal  . Increased risk of breast cancer 09/2016   IBIS=17.0%/riskscore=22.4%  . Numbness in feet    s/p foot and back surgery  . PONV (postoperative nausea and vomiting)   . Rosacea   . Special screening for malignant neoplasms, colon   . Tendinitis of wrist 03/11/2015  . Tinnitus  August     Past Surgical History:  Procedure Laterality Date  . BUNIONECTOMY Bilateral   . COLONOSCOPY WITH PROPOFOL N/A 03/12/2016   Procedure: COLONOSCOPY WITH PROPOFOL;  Surgeon: Lucilla Lame, MD;  Location: [place];  Service: Endoscopy;  Laterality: N/A;  . COLONOSCOPY WITH PROPOFOL N/A 06/05/2019   Procedure: COLONOSCOPY WITH BIOPSIES;  Surgeon: Lucilla Lame, MD;  Location: [place];  Service: Endoscopy;  Laterality: N/A;  . LUMBAR [place]    . POLYPECTOMY  03/12/2016   Procedure: POLYPECTOMY;  Surgeon: Lucilla Lame, MD;  Location: [place];  Service: Endoscopy;;  . POLYPECTOMY N/A 06/05/2019   Procedure: POLYPECTOMY;  Surgeon: Lucilla Lame, MD;  Location: [place];  Service: Endoscopy;  Laterality: N/A;    Social History   Socioeconomic History  . Marital status: Married    Spouse name: Not on file  . Number of children: Not on file  . Years of education: Not on file  . Highest education level: Not on file  Occupational History  . Not on file  Tobacco Use  . Smoking status: Current Every Day Smoker    Packs/day: 1.00    Years: 40.00    Pack years: 40.00    Types: Cigarettes  . Smokeless tobacco: Never Used  Vaping Use  . Vaping Use: Never used  Substance and Sexual Activity  . Alcohol use: Yes    Alcohol/week: 2.0 standard drinks    Types: 2 Standard drinks or equivalent per week  . Drug use: No  . Sexual activity: Yes  Other Topics Concern  . Not on file  Social History Narrative  . Not on file   Social Determinants of Health   Financial Resource Strain:   . Difficulty of Paying Living Expenses: Not on file  Food Insecurity:   . Worried About Charity fundraiser in the Last Year: Not on file  . Ran Out of Food in the Last Year: Not on file  Transportation Needs:   . Lack of Transportation (Medical): Not on file  . Lack of Transportation (Non-Medical): Not on file  Physical Activity:   . Days of Exercise per Week: Not on file  . Minutes of Exercise per Session: Not on file  Stress:   . Feeling of Stress : Not on file  Social Connections:   . Frequency of Communication with Friends and Family: Not on file  . Frequency of Social Gatherings with Friends and Family: Not on file  . Attends Religious Services: Not on file  . Active Member of Clubs or Organizations: Not on file  . Attends Archivist Meetings: Not on file  . Marital Status: Not on file  Intimate Partner Violence:   . Fear of Current or Ex-Partner: Not on file  . Emotionally Abused: Not on file  . Physically Abused: Not on file  . Sexually Abused: Not on file    Family History  Problem Relation Age of Onset  . Breast cancer Maternal  Grandmother 60       twice, bilat breast--88  . Pancreatic cancer Maternal Uncle 60  . Breast cancer Other   . Breast cancer Other   . Ovarian cancer Other      Current Outpatient Medications:  .  Ciclopirox 1 % shampoo, Apply topically 3 times/day as needed-between meals & bedtime. , Disp: , Rfl:  .  doxycycline (ORACEA) 40 MG capsule, Take 20 mg by mouth every morning., Disp: , Rfl:  .  loratadine (CLARITIN REDITABS) 10 MG dissolvable tablet, Take 1 tablet by mouth daily., Disp: , Rfl:  .  montelukast (SINGULAIR) 10 MG tablet, TAKE 1 TABLET BY MOUTH EVERYDAY AT BEDTIME, Disp: 90 tablet, Rfl: 1 .  Multiple Vitamin (MULTIVITAMIN) capsule, Take 1 capsule by mouth daily., Disp: , Rfl:  .  triamcinolone (NASACORT ALLERGY 24HR) 55 MCG/ACT AERO nasal inhaler, Place 2 sprays into the nose daily., Disp: , Rfl:  .  Vitamin D, Cholecalciferol, 50 MCG (2000 UT) CAPS, Take 1,000 Units by mouth daily. , Disp: , Rfl:   Physical exam: There were no vitals filed for this visit. Limited d/t telephone visit.   CMP Latest Ref Rng & Units 03/14/2019  Glucose 65 - 99 mg/dL 95  BUN 6 - 24 mg/dL 8  Creatinine 0.57 - 1.00 mg/dL 0.67  Sodium 134 - 144 mmol/L 143  Potassium 3.5 - 5.2 mmol/L 4.7  Chloride 96 - 106 mmol/L 104  CO2 20 - 29 mmol/L 27  Calcium 8.7 - 10.2 mg/dL 9.5  Total Protein 6.0 - 8.5 g/dL 6.2  Total Bilirubin 0.0 - 1.2 mg/dL 0.3  Alkaline Phos 39 - 117 IU/L 118(H)  AST 0 - 40 IU/L 13  ALT 0 - 32 IU/L 15   CBC Latest Ref Rng & Units 03/14/2019  WBC 3.4 - 10.8 x10E3/uL 9.8  Hemoglobin 11.1 - 15.9 g/dL 14.1  Hematocrit 34.0 - 46.6 % 41.8  Platelets 150 - 450 x10E3/uL 318    No images are attached to the encounter.  CT Chest Wo Contrast  Result Date: 03/22/2020 CLINICAL DATA:  Follow-up pulmonary nodule. EXAM: CT CHEST WITHOUT CONTRAST TECHNIQUE: Multidetector CT imaging of the chest was performed following the standard protocol without IV contrast. COMPARISON:  03/21/2019 chest CT  FINDINGS: Cardiovascular: Normal heart size. No significant pericardial effusion/thickening. Left anterior descending coronary atherosclerosis. Atherosclerotic nonaneurysmal thoracic aorta. Normal caliber pulmonary arteries. Mediastinum/Nodes: No discrete thyroid nodules. Unremarkable esophagus. No pathologically enlarged axillary, mediastinal or hilar lymph nodes, noting limited sensitivity for the detection of hilar adenopathy on this noncontrast study. Lungs/Pleura: No pneumothorax. No pleural effusion. Moderate centrilobular emphysema with mild diffuse bronchial wall thickening. No acute consolidative airspace disease or lung masses. Indistinct right lower lobe 1.2 x 1.1 cm pulmonary nodule (series 3/image 86), previously 1.2 x 1.1 cm using similar measurement technique, stable. Several small solid pulmonary nodules scattered in both lungs, largest 5 mm in the peripheral right middle lobe (series 3/image 92), all stable and considered benign. No new significant pulmonary nodules. Upper abdomen: Small hiatal hernia. Cholelithiasis. Stable 1.7 cm right adrenal adenoma with density -10 HU. Stable 1.7 cm left adrenal nodule with density 36 HU, also compatible with an adenoma. Musculoskeletal: No aggressive appearing focal osseous lesions. Mild thoracic spondylosis. IMPRESSION: 1. Interval stability of indistinct subsolid right lower lobe 1.2 cm pulmonary nodule. Annual follow-up noncontrast chest CT is recommended until 5 years of stability has been established. This recommendation follows the consensus statement: Guidelines for Management of Incidental Pulmonary Nodules Detected on CT Images: From the Fleischner Society 2017; Radiology 2017; 284:228-243. 2. No thoracic adenopathy. 3. Moderate centrilobular emphysema with mild diffuse bronchial wall thickening, suggesting COPD. 4. One vessel coronary atherosclerosis. 5. Small hiatal hernia. 6. Cholelithiasis. 7. Stable bilateral adrenal adenomas. 8. Aortic  Atherosclerosis (ICD10-I70.0) and Emphysema (ICD10-J43.9). Electronically Signed   By: Ilona Sorrel M.D.   On: 03/22/2020 08:51   Assessment and plan- Patient is a 58 y.o. female who presents to pulmonary nodule clinic for follow-up of  incidental lung nodules.  A telephone visit was conducted to review most recent CT scan results.      Calculating malignancy probability of a pulmonary nodule: Risk factors include: 1.  Age. 2.  Cancer history. 3.  Diameter of pulmonary nodule and mm 4.  Location 5.  Smoking history 6.  Spiculation present   Based on risk factors, this patient is High risk for the development of lung cancer.  Lung nodule is stable and she does meet criteria for annual low-dose CT screening program.  Will refer to Burgess Estelle, nurse navigator.    During our visit, we discussed pulmonary nodules are a common incidental finding and are often how lung cancer is discovered.  Lung cancer survival is directly related to the stage at diagnosis.  We discussed that nodules can vary in presentation from solitary pulmonary nodules to masses, 2 groundglass opacities and multiple nodules.  Pulmonary nodules in the majority of cases are benign but the probability of these becoming malignant cannot be undermined.  Early identification of malignant nodules could lead to early diagnosis and increased survival.   We discussed the probability of pulmonary nodules becoming malignant increase with age, pack years of tobacco use, size/characteristics of the nodule and location; with upper lobe involvement being most worrisome.   We discussed the goal of our clinic is to thoroughly evaluate each nodule, developed a comprehensive, individualized plan of care utilizing the most advanced technology and significantly reduce the time from detection to treatment.  A dedicated pulmonary nodule clinic has proven to indeed expedite the detection and treatment of lung cancer.   Patient education in fact sheet  provided along with most recent CT scans.  Disposition:  Referral to our low-dose CT screening program.  Visit Diagnosis 1. Lung nodule   2. Personal history of tobacco use, presenting hazards to health     Patient expressed understanding and was in agreement with this plan. She also understands that She can call clinic at any time with any questions, concerns, or complaints.   Greater than 50% was spent in counseling and coordination of care with this patient including but not limited to discussion of the relevant topics above (See A&P) including, but not limited to diagnosis and management of acute and chronic medical conditions.   Thank you for allowing me to participate in the care of this very pleasant patient.    Jacquelin Hawking, NP [place] [facility] Cell - [phone] Pager- [phone] 03/26/2020 1:21 PM

## 2020-04-08 ENCOUNTER — Encounter: Payer: Self-pay | Admitting: Internal Medicine

## 2020-04-08 ENCOUNTER — Ambulatory Visit (INDEPENDENT_AMBULATORY_CARE_PROVIDER_SITE_OTHER): Payer: BC Managed Care – PPO | Admitting: Internal Medicine

## 2020-04-08 ENCOUNTER — Other Ambulatory Visit (HOSPITAL_COMMUNITY)
Admission: RE | Admit: 2020-04-08 | Discharge: 2020-04-08 | Disposition: A | Discharge status: Home / Self Care | Payer: BC Managed Care – PPO | Source: Ambulatory Visit | Attending: Internal Medicine | Admitting: Internal Medicine

## 2020-04-08 ENCOUNTER — Other Ambulatory Visit: Payer: Self-pay

## 2020-04-08 VITALS — BP 104/62 | HR 68 | Temp 98.3°F | Ht 70.0 in | Wt 193.0 lb

## 2020-04-08 DIAGNOSIS — Z124 Encounter for screening for malignant neoplasm of cervix: Secondary | ICD-10-CM | POA: Insufficient documentation

## 2020-04-08 DIAGNOSIS — Z0000 Encounter for general adult medical examination without abnormal findings: Secondary | ICD-10-CM

## 2020-04-08 DIAGNOSIS — E042 Nontoxic multinodular goiter: Secondary | ICD-10-CM | POA: Insufficient documentation

## 2020-04-08 DIAGNOSIS — Z1231 Encounter for screening mammogram for malignant neoplasm of breast: Secondary | ICD-10-CM | POA: Diagnosis not present

## 2020-04-08 DIAGNOSIS — E782 Mixed hyperlipidemia: Secondary | ICD-10-CM | POA: Diagnosis not present

## 2020-04-08 DIAGNOSIS — Z23 Encounter for immunization: Secondary | ICD-10-CM | POA: Diagnosis not present

## 2020-04-08 LAB — POCT URINALYSIS DIPSTICK
Bilirubin, UA: NEGATIVE
Blood, UA: NEGATIVE
Glucose, UA: NEGATIVE
Ketones, UA: NEGATIVE
Leukocytes, UA: NEGATIVE
Nitrite, UA: NEGATIVE
Protein, UA: NEGATIVE
Spec Grav, UA: 1.015 (ref 1.010–1.025)
Urobilinogen, UA: 0.2 E.U./dL
pH, UA: 6 (ref 5.0–8.0)

## 2020-04-08 NOTE — Progress Notes (Signed)
Date:  04/08/2020   Name:  Sharon [name]   DOB:  February 11, 1961   MRN:  [id number]   Chief Complaint: Annual Exam (Breast exam , pap smear , 2nd shingles, and flu shot. )  Sharon [name] is a 59 y.o. female who presents today for her Complete Annual Exam. She feels well. She reports exercising - walking every day. She reports she is sleeping fairly well. Breast complaints - none.  Mammogram: 01/2019 [facility] DEXA: none Pap smear: 09/2016 @ GYN  Colonoscopy: 05/2019 repeat 5 yrs  Immunization History  Administered Date(s) Administered  . Influenza Inj Mdck Quad Pf 02/25/2017  . Influenza,inj,Quad PF,6+ Mos 03/11/2018, 03/14/2019  . PFIZER SARS-COV-2 Vaccination 07/14/2019, 07/31/2019  . Pneumococcal Conjugate-13 03/11/2018  . Tdap 06/13/2014  . Zoster Recombinat (Shingrix) 11/15/2019  due for Shingrix #2  HPI  Lab Results  Component Value Date   CREATININE 0.67 03/14/2019   BUN 8 03/14/2019   NA 143 03/14/2019   K 4.7 03/14/2019   CL 104 03/14/2019   CO2 27 03/14/2019   Lab Results  Component Value Date   CHOL 227 (H) 03/14/2019   HDL 60 03/14/2019   LDLCALC 148 (H) 03/14/2019   TRIG 107 03/14/2019   CHOLHDL 3.8 03/14/2019   Lab Results  Component Value Date   TSH 1.490 03/14/2019   No results found for: HGBA1C Lab Results  Component Value Date   WBC 9.8 03/14/2019   HGB 14.1 03/14/2019   HCT 41.8 03/14/2019   MCV 94 03/14/2019   PLT 318 03/14/2019   Lab Results  Component Value Date   ALT 15 03/14/2019   AST 13 03/14/2019   ALKPHOS 118 (H) 03/14/2019   BILITOT 0.3 03/14/2019     Review of Systems  Constitutional: Negative for chills, fatigue and fever.  HENT: Negative for congestion, hearing loss, tinnitus, trouble swallowing and voice change.   Eyes: Negative for visual disturbance.  Respiratory: Negative for cough, chest tightness, shortness of breath and wheezing.   Cardiovascular: Negative for chest pain, palpitations and leg swelling.    Gastrointestinal: Negative for abdominal pain, constipation, diarrhea and vomiting.  Endocrine: Negative for polydipsia and polyuria.  Genitourinary: Negative for dysuria, frequency, genital sores, vaginal bleeding and vaginal discharge.  Musculoskeletal: Negative for arthralgias, gait problem and joint swelling.  Skin: Negative for color change and rash.  Neurological: Negative for dizziness, tremors, light-headedness and headaches.  Hematological: Negative for adenopathy. Does not bruise/bleed easily.  Psychiatric/Behavioral: Negative for dysphoric mood and sleep disturbance. The patient is not nervous/anxious.     Patient Active Problem List   Diagnosis Date Noted  . Multinodular goiter (nontoxic) 04/08/2020  . Personal history of colonic polyps   . Polyp of transverse colon   . Mixed hyperlipidemia 03/11/2018  . Multiple pulmonary nodules 03/11/2018  . Family history of breast cancer   . Vitamin D deficiency 09/28/2016  . Increased risk of breast cancer 09/08/2016  . Environmental and seasonal allergies 05/22/2016  . Polyp of sigmoid colon   . Pain in shoulder 03/11/2015  . Muscle spasms of neck 03/11/2015  . Acne erythematosa 03/11/2015  . Seborrhea capitis 03/11/2015  . 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 Compulsive tobacco user syndrome 03/11/2015    Allergies  Allergen Reactions  . Amoxicillin Itching    Past Surgical History:  Procedure Laterality Date  . BUNIONECTOMY Bilateral   . COLONOSCOPY WITH PROPOFOL N/A 03/12/2016   Procedure: COLONOSCOPY WITH PROPOFOL;  Surgeon: Lucilla Lame, MD;  Location: [facility];  Service: Endoscopy;  Laterality: N/A;  . COLONOSCOPY WITH PROPOFOL N/A 06/05/2019   Procedure: COLONOSCOPY WITH BIOPSIES;  Surgeon: Lucilla Lame, MD;  Location: [place];  Service: Endoscopy;  Laterality: N/A;  . LUMBAR [place]    . POLYPECTOMY  03/12/2016   Procedure: POLYPECTOMY;  Surgeon: Lucilla Lame, MD;  Location: [place];   Service: Endoscopy;;  . POLYPECTOMY N/A 06/05/2019   Procedure: POLYPECTOMY;  Surgeon: Lucilla Lame, MD;  Location: [place];  Service: Endoscopy;  Laterality: N/A;    Social History   Tobacco Use  . Smoking status: Current Every Day Smoker    Packs/day: 1.00    Years: 40.00    Pack years: 40.00    Types: Cigarettes  . Smokeless tobacco: Never Used  Vaping Use  . Vaping Use: Never used  Substance Use Topics  . Alcohol use: Yes    Alcohol/week: 2.0 standard drinks    Types: 2 Standard drinks or equivalent per week  . Drug use: No     Medication list has been reviewed and updated.  Current Meds  Medication Sig  . Ciclopirox 1 % shampoo Apply topically 3 times/day as needed-between meals & bedtime.   Marland Kitchen doxycycline (ORACEA) 40 MG capsule Take 20 mg by mouth every morning.  Marland Kitchen doxycycline (PERIOSTAT) 20 MG tablet Take 20 mg by mouth 2 (two) times daily.  Marland Kitchen loratadine (CLARITIN REDITABS) 10 MG dissolvable tablet Take 1 tablet by mouth daily.  . montelukast (SINGULAIR) 10 MG tablet TAKE 1 TABLET BY MOUTH EVERYDAY AT BEDTIME  . Multiple Vitamin (MULTIVITAMIN) capsule Take 1 capsule by mouth daily.  Marland Kitchen triamcinolone (NASACORT ALLERGY 24HR) 55 MCG/ACT AERO nasal inhaler Place 2 sprays into the nose daily.  . Vitamin D, Cholecalciferol, 50 MCG (2000 UT) CAPS Take 1,000 Units by mouth daily.     PHQ 2/9 Scores 04/08/2020 11/15/2019 03/14/2019 09/14/2018  PHQ - 2 Score 0 0 0 0  PHQ- 9 Score 0 0 2 -    GAD 7 : Generalized Anxiety Score 04/08/2020 11/15/2019  Nervous, Anxious, on Edge 0 0  Control/stop worrying 0 0  Worry too much - different things 0 0  Trouble relaxing 0 0  Restless 0 0  Easily annoyed or irritable 0 0  Afraid - awful might happen 0 0  Total GAD 7 Score 0 0  Anxiety Difficulty - Not difficult at all    BP Readings from Last 3 Encounters:  04/08/20 104/62  11/28/19 (!) 145/80  11/15/19 136/82    Physical Exam Vitals and nursing note reviewed.    Constitutional:      General: She is not in acute distress.    Appearance: She is well-developed.  HENT:     Head: Normocephalic and atraumatic.     Right Ear: Tympanic membrane and ear canal normal.     Left Ear: Tympanic membrane and ear canal normal.     Nose:     Right Sinus: No maxillary sinus tenderness.     Left Sinus: No maxillary sinus tenderness.  Eyes:     General: No scleral icterus.       Right eye: No discharge.        Left eye: No discharge.     Conjunctiva/sclera: Conjunctivae normal.  Neck:     Thyroid: No thyromegaly.     Vascular: No carotid bruit.  Cardiovascular:     Rate and Rhythm: Normal rate and regular rhythm.     Pulses:  Normal pulses.     Heart sounds: Normal heart sounds.  Pulmonary:     Effort: Pulmonary effort is normal. No respiratory distress.     Breath sounds: No wheezing.  Chest:     Breasts:        Right: No mass, nipple discharge, skin change or tenderness.        Left: No mass, nipple discharge, skin change or tenderness.  Abdominal:     General: Bowel sounds are normal.     Palpations: Abdomen is soft.     Tenderness: There is no abdominal tenderness.  Genitourinary:    Labia:        Right: No rash, tenderness or lesion.        Left: No rash, tenderness or lesion.      Vagina: Normal.     Cervix: Normal.     Uterus: Normal.      Adnexa: Right adnexa normal and left adnexa normal.  Musculoskeletal:     Cervical back: Normal range of motion. No erythema.     Right lower leg: No edema.     Left lower leg: No edema.  Lymphadenopathy:     Cervical: No cervical adenopathy.  Skin:    General: Skin is warm and dry.     Capillary Refill: Capillary refill takes less than 2 seconds.     Findings: No rash.  Neurological:     General: No focal deficit present.     Mental Status: She is alert and oriented to person, place, and time.     Cranial Nerves: No cranial nerve deficit.     Sensory: No sensory deficit.     Deep Tendon  Reflexes: Reflexes are normal and symmetric.  Psychiatric:        Attention and Perception: Attention normal.        Mood and Affect: Mood normal.     Wt Readings from Last 3 Encounters:  04/08/20 193 lb (87.5 kg)  11/15/19 197 lb (89.4 kg)  06/05/19 193 lb (87.5 kg)    BP 104/62   Pulse 68   Temp 98.3 F (36.8 C) (Oral)   Ht 5\' 10"  (1.778 m)   Wt 193 lb (87.5 kg)   SpO2 95%   BMI 27.69 kg/m   Assessment and Plan: 1. Annual physical exam Normal exam - continue healthy diet; recommend regular exercise - CBC with Differential/Platelet - Comprehensive metabolic panel - POCT urinalysis dipstick  2. Encounter for screening mammogram for breast cancer Schedule at [place]; Future  3. Mixed hyperlipidemia Check labs and advise - Lipid panel  4. Multinodular goiter (nontoxic) Recent bx negative - TSH + free T4  5. Encounter for screening for cervical cancer Pap obtained Exam normal - Cytology - PAP  6. Need for shingles vaccine Second dose today   Partially dictated using Editor, commissioning. Any errors are unintentional.  Halina Maidens, MD [facility]  04/08/2020

## 2020-04-09 LAB — COMPREHENSIVE METABOLIC PANEL
ALT: 10 IU/L (ref 0–32)
AST: 13 IU/L (ref 0–40)
Albumin/Globulin Ratio: 2.3 — ABNORMAL HIGH (ref 1.2–2.2)
Albumin: 4.3 g/dL (ref 3.8–4.9)
Alkaline Phosphatase: 117 IU/L (ref 44–121)
BUN/Creatinine Ratio: 14 (ref 9–23)
BUN: 10 mg/dL (ref 6–24)
Bilirubin Total: 0.3 mg/dL (ref 0.0–1.2)
CO2: 24 mmol/L (ref 20–29)
Calcium: 9.9 mg/dL (ref 8.7–10.2)
Chloride: 102 mmol/L (ref 96–106)
Creatinine, Ser: 0.72 mg/dL (ref 0.57–1.00)
GFR calc Af Amer: 106 mL/min/{1.73_m2} (ref >59)
GFR calc non Af Amer: 92 mL/min/{1.73_m2} (ref >59)
Globulin, Total: 1.9 g/dL (ref 1.5–4.5)
Glucose: 92 mg/dL (ref 65–99)
Potassium: 4.6 mmol/L (ref 3.5–5.2)
Sodium: 141 mmol/L (ref 134–144)
Total Protein: 6.2 g/dL (ref 6.0–8.5)

## 2020-04-09 LAB — LIPID PANEL
Chol/HDL Ratio: 3.8 ratio (ref 0.0–4.4)
Cholesterol, Total: 236 mg/dL — ABNORMAL HIGH (ref 100–199)
HDL: 62 mg/dL (ref >39)
LDL Chol Calc (NIH): 152 mg/dL — ABNORMAL HIGH (ref 0–99)
Triglycerides: 125 mg/dL (ref 0–149)
VLDL Cholesterol Cal: 22 mg/dL (ref 5–40)

## 2020-04-09 LAB — CBC WITH DIFFERENTIAL/PLATELET
Basophils Absolute: 0.1 10*3/uL (ref 0.0–0.2)
Basos: 1 %
EOS (ABSOLUTE): 0.3 10*3/uL (ref 0.0–0.4)
Eos: 3 %
Hematocrit: 43.8 % (ref 34.0–46.6)
Hemoglobin: 14.9 g/dL (ref 11.1–15.9)
Immature Grans (Abs): 0 10*3/uL (ref 0.0–0.1)
Immature Granulocytes: 0 %
Lymphocytes Absolute: 2.7 10*3/uL (ref 0.7–3.1)
Lymphs: 26 %
MCH: 32.6 pg (ref 26.6–33.0)
MCHC: 34 g/dL (ref 31.5–35.7)
MCV: 96 fL (ref 79–97)
Monocytes Absolute: 0.7 10*3/uL (ref 0.1–0.9)
Monocytes: 7 %
Neutrophils Absolute: 6.6 10*3/uL (ref 1.4–7.0)
Neutrophils: 63 %
Platelets: 333 10*3/uL (ref 150–450)
RBC: 4.57 x10E6/uL (ref 3.77–5.28)
RDW: 11.3 % — ABNORMAL LOW (ref 11.7–15.4)
WBC: 10.4 10*3/uL (ref 3.4–10.8)

## 2020-04-09 LAB — TSH+FREE T4
Free T4: 1.22 ng/dL (ref 0.82–1.77)
TSH: 1.17 u[IU]/mL (ref 0.450–4.500)

## 2020-04-10 LAB — CYTOLOGY - PAP
Comment: NEGATIVE
Diagnosis: NEGATIVE
High risk HPV: NEGATIVE

## 2020-05-10 ENCOUNTER — Other Ambulatory Visit: Payer: Self-pay | Admitting: Internal Medicine

## 2020-05-10 DIAGNOSIS — J3089 Other allergic rhinitis: Secondary | ICD-10-CM

## 2020-05-10 NOTE — Telephone Encounter (Signed)
Requested medications are due for refill today yes  Requested medications are on the active medication list yes  Last refill 10/2  Last visit Med/dx not addressed in 2021  Future visit scheduled 04/2021  Notes to clinic Failed protocol due to no valid visit within 6  months.

## 2020-05-16 ENCOUNTER — Ambulatory Visit
Admission: RE | Admit: 2020-05-16 | Discharge: 2020-05-16 | Disposition: A | Discharge status: Home / Self Care | Payer: Managed Care, Other (non HMO) | Source: Ambulatory Visit | Attending: Internal Medicine | Admitting: Internal Medicine

## 2020-05-16 ENCOUNTER — Other Ambulatory Visit: Payer: Self-pay

## 2020-05-16 DIAGNOSIS — Z1231 Encounter for screening mammogram for malignant neoplasm of breast: Secondary | ICD-10-CM | POA: Insufficient documentation

## 2020-10-28 ENCOUNTER — Other Ambulatory Visit: Payer: Self-pay | Admitting: Internal Medicine

## 2020-10-28 DIAGNOSIS — J3089 Other allergic rhinitis: Secondary | ICD-10-CM

## 2020-12-09 DIAGNOSIS — H9319 Tinnitus, unspecified ear: Secondary | ICD-10-CM

## 2020-12-09 HISTORY — DX: Tinnitus, unspecified ear: H93.19

## 2021-01-13 ENCOUNTER — Other Ambulatory Visit: Payer: Self-pay

## 2021-01-13 ENCOUNTER — Ambulatory Visit
Admission: EM | Admit: 2021-01-13 | Discharge: 2021-01-13 | Disposition: A | Discharge status: Home / Self Care | Payer: Managed Care, Other (non HMO) | Attending: Family Medicine | Admitting: Family Medicine

## 2021-01-13 ENCOUNTER — Encounter: Payer: Self-pay | Admitting: Emergency Medicine

## 2021-01-13 DIAGNOSIS — W57XXXA Bitten or stung by nonvenomous insect and other nonvenomous arthropods, initial encounter: Secondary | ICD-10-CM | POA: Diagnosis not present

## 2021-01-13 DIAGNOSIS — S50861A Insect bite (nonvenomous) of right forearm, initial encounter: Secondary | ICD-10-CM | POA: Diagnosis not present

## 2021-01-13 DIAGNOSIS — L03113 Cellulitis of right upper limb: Secondary | ICD-10-CM

## 2021-01-13 MED ORDER — HYDROXYZINE HCL 25 MG PO TABS: 25.0000 mg | ORAL_TABLET | Freq: Three times a day (TID) | ORAL | 0 refills | Status: DC | PRN

## 2021-01-13 MED ORDER — SULFAMETHOXAZOLE-TRIMETHOPRIM 800-160 MG PO TABS: 1.0000 | ORAL_TABLET | Freq: Two times a day (BID) | ORAL | 0 refills | Status: AC

## 2021-01-13 NOTE — ED Triage Notes (Signed)
Pt  is present today with rash and swelling on the lower left arm. Pt states that she was bitten by a insect yesterday and noticed the rash and swelling last night and today Pt states that she noticed it yesterday around 5pm.

## 2021-01-13 NOTE — Discharge Instructions (Addendum)
Rest. Ice.  Medication as prescribed.  If this worsens, please let us know.  Take care  Dr. Lacinda Axon

## 2021-01-13 NOTE — ED Provider Notes (Signed)
[facility]    CSN: [id number] Arrival date & time: 01/13/21  1527      History   Chief Complaint Chief Complaint  Patient presents with   Insect Bite    HPI  60 year old female presents with the above complaint.  Patient states that she was "splitting wood" yesterday.  She had gloves on.  She states that she believes that she was bitten by a spider.  She did not see the insect.  She reports redness, swelling, and pain of the right wrist and forearm.  She states that the redness seems to be streaking upward.  This occurred at approximately 5 PM yesterday.  No fever.  No relieving factors.  She does note associated itching.  Past Medical History:  Diagnosis Date   Benign neoplasm of ascending colon    BRCA negative 09/2016   My Risk neg   Disease of nail 03/11/2015   Encounter for nonprocreative genetic counseling    Family history of breast cancer    and pancreatic   Floppy mitral valve    told this "long ago".  No issues   Headache    sinus, seasonal   Increased risk of breast cancer 09/2016   IBIS=17.0%/riskscore=22.4%   Numbness in feet    s/p foot and back surgery   PONV (postoperative nausea and vomiting)    Rosacea    Special screening for malignant neoplasms, colon    Tendinitis of wrist 03/11/2015   Tinnitus August    Patient Active Problem List   Diagnosis Date Noted   Multinodular goiter (nontoxic) 04/08/2020   Personal history of colonic polyps    Polyp of transverse colon    Mixed hyperlipidemia 03/11/2018   Multiple pulmonary nodules 03/11/2018   Family history of breast cancer    Vitamin D deficiency 09/28/2016   Increased risk of breast cancer 09/08/2016   Environmental and seasonal allergies 05/22/2016   Polyp of sigmoid colon    Pain in shoulder 03/11/2015   Muscle spasms of neck 03/11/2015   Acne erythematosa 03/11/2015   Seborrhea capitis 03/11/2015   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   Compulsive tobacco user syndrome 03/11/2015     Past Surgical History:  Procedure Laterality Date   BUNIONECTOMY Bilateral    COLONOSCOPY WITH PROPOFOL N/A 03/12/2016   Procedure: COLONOSCOPY WITH PROPOFOL;  Surgeon: Lucilla Lame, MD;  Location: [place];  Service: Endoscopy;  Laterality: N/A;   COLONOSCOPY WITH PROPOFOL N/A 06/05/2019   Procedure: COLONOSCOPY WITH BIOPSIES;  Surgeon: Lucilla Lame, MD;  Location: [place];  Service: Endoscopy;  Laterality: N/A;   LUMBAR DISC SURGERY     POLYPECTOMY  03/12/2016   Procedure: POLYPECTOMY;  Surgeon: Lucilla Lame, MD;  Location: [place];  Service: Endoscopy;;   POLYPECTOMY N/A 06/05/2019   Procedure: POLYPECTOMY;  Surgeon: Lucilla Lame, MD;  Location: [place];  Service: Endoscopy;  Laterality: N/A;    OB History     Gravida  1   Para  1   Term  1   Preterm      AB      Living  1      SAB      IAB      Ectopic      Multiple      Live Births               Home Medications    Prior to Admission medications   Medication Sig Start Date End Date Taking? Authorizing  Provider  hydrOXYzine (ATARAX/VISTARIL) 25 MG tablet Take 1 tablet (25 mg total) by mouth every 8 (eight) hours as needed. 01/13/21  Yes Cook, Jayce G, DO  sulfamethoxazole-trimethoprim (BACTRIM DS) 800-160 MG tablet Take 1 tablet by mouth 2 (two) times daily for 7 days. 01/13/21 01/20/21 Yes Cook, Jayce G, DO  Ciclopirox 1 % shampoo Apply topically 3 times/day as needed-between meals & bedtime.  05/31/19   [provider]  doxycycline (ORACEA) 40 MG capsule Take 20 mg by mouth every morning.    [provider]  loratadine (CLARITIN REDITABS) 10 MG dissolvable tablet Take 1 tablet by mouth daily.    [provider]  montelukast (SINGULAIR) 10 MG tablet TAKE 1 TABLET BY MOUTH EVERYDAY AT BEDTIME 10/28/20   Glean Hess, MD  Multiple Vitamin (MULTIVITAMIN) capsule Take 1 capsule by mouth daily.    [provider]  triamcinolone  (NASACORT ALLERGY 24HR) 55 MCG/ACT AERO nasal inhaler Place 2 sprays into the nose daily.    [provider]  Vitamin D, Cholecalciferol, 50 MCG (2000 UT) CAPS Take 1,000 Units by mouth daily.     [provider]    Family History Family History  Problem Relation Age of Onset   Breast cancer Maternal Grandmother 60       twice, bilat breast--88   Pancreatic cancer Maternal Uncle 60   Breast cancer Other    Breast cancer Other    Ovarian cancer Other     Social History Social History   Tobacco Use   Smoking status: Every Day    Packs/day: 1.00    Years: 40.00    Pack years: 40.00    Types: Cigarettes   Smokeless tobacco: Never  Vaping Use   Vaping Use: Never used  Substance Use Topics   Alcohol use: Yes    Alcohol/week: 2.0 standard drinks    Types: 2 Standard drinks or equivalent per week   Drug use: No     Allergies   Amoxicillin   Review of Systems Review of Systems Per HPI  Physical Exam Triage Vital Signs ED Triage Vitals  Enc Vitals Group     BP 01/13/21 1544 (!) 147/79     Pulse Rate 01/13/21 1544 85     Resp --      Temp 01/13/21 1544 98.1 F (36.7 C)     Temp src --      SpO2 01/13/21 1544 99 %     Weight --      Height --      Head Circumference --      Peak Flow --      Pain Score 01/13/21 1543 1     Pain Loc --      Pain Edu? --      Excl. in [place]? --    Updated Vital Signs BP (!) 147/79   Pulse 85   Temp 98.1 F (36.7 C)   SpO2 99%   Visual Acuity Right Eye Distance:   Left Eye Distance:   Bilateral Distance:    Right Eye Near:   Left Eye Near:    Bilateral Near:     Physical Exam Vitals and nursing note reviewed.  Constitutional:      General: She is not in acute distress.    Appearance: Normal appearance. She is not ill-appearing.  HENT:     Head: Normocephalic and atraumatic.  Eyes:     General:        Right eye:  No discharge.        Left eye: No discharge.     Conjunctiva/sclera: Conjunctivae  normal.  Pulmonary:     Effort: Pulmonary effort is normal. No respiratory distress.  Skin:    Comments: Right wrist and distal forearm with erythema, warmth, and swelling.  Neurological:     Mental Status: She is alert.  Psychiatric:        Mood and Affect: Mood normal.        Behavior: Behavior normal.     UC Treatments / Results  Labs (all labs ordered are listed, but only abnormal results are displayed) Labs Reviewed - No data to display  EKG   Radiology No results found.  Procedures Procedures (including critical care time)  Medications Ordered in UC Medications - No data to display  Initial Impression / Assessment and Plan / UC Course  I have reviewed the triage vital signs and the nursing notes.  Pertinent labs & imaging results that were available during my care of the patient were reviewed by me and considered in my medical decision making (see chart for details).    60 year old female presents with an insect bite and subsequent cellulitis.  Treating with Bactrim and Atarax.  Advised rest, ice.  Supportive care.  Final Clinical Impressions(s) / UC Diagnoses   Final diagnoses:  Insect bite of right forearm, initial encounter  Cellulitis of right upper extremity     Discharge Instructions      Rest. Ice.  Medication as prescribed.  If this worsens, please let us know.  Take care  Dr. Lacinda Axon    ED Prescriptions     Medication Sig Dispense Auth. Provider   sulfamethoxazole-trimethoprim (BACTRIM DS) 800-160 MG tablet Take 1 tablet by mouth 2 (two) times daily for 7 days. 14 tablet Cook, Jayce G, DO   hydrOXYzine (ATARAX/VISTARIL) 25 MG tablet Take 1 tablet (25 mg total) by mouth every 8 (eight) hours as needed. 30 tablet Coral Spikes, DO      PDMP not reviewed this encounter.   Coral Spikes, DO 01/13/21 1600

## 2021-04-14 ENCOUNTER — Encounter: Payer: Self-pay | Admitting: Internal Medicine

## 2021-04-15 ENCOUNTER — Ambulatory Visit (INDEPENDENT_AMBULATORY_CARE_PROVIDER_SITE_OTHER): Payer: Managed Care, Other (non HMO) | Admitting: Internal Medicine

## 2021-04-15 ENCOUNTER — Other Ambulatory Visit: Payer: Self-pay

## 2021-04-15 ENCOUNTER — Encounter: Payer: Self-pay | Admitting: Internal Medicine

## 2021-04-15 VITALS — BP 100/74 | HR 69 | Ht 70.0 in | Wt 185.4 lb

## 2021-04-15 DIAGNOSIS — Z0000 Encounter for general adult medical examination without abnormal findings: Secondary | ICD-10-CM | POA: Diagnosis not present

## 2021-04-15 DIAGNOSIS — E782 Mixed hyperlipidemia: Secondary | ICD-10-CM

## 2021-04-15 DIAGNOSIS — L719 Rosacea, unspecified: Secondary | ICD-10-CM | POA: Diagnosis not present

## 2021-04-15 DIAGNOSIS — E042 Nontoxic multinodular goiter: Secondary | ICD-10-CM

## 2021-04-15 DIAGNOSIS — Z1231 Encounter for screening mammogram for malignant neoplasm of breast: Secondary | ICD-10-CM

## 2021-04-15 NOTE — Progress Notes (Signed)
Date:  04/15/2021   Name:  Sharon [name]   DOB:  10-Jan-1961   MRN:  [id number]   Chief Complaint: Annual Exam (Breast Exam. Declined flu shot.) Sharon [name] is a 60 y.o. female who presents today for her Complete Annual Exam. She feels well. She reports exercising / walking. She reports she is sleeping fairly well. Breast complaints - none.  Mammogram: 05/2020 DEXA: none Pap smear: 03/2020 neg with co-testing Colonoscopy: 05/2019 repeat 5 yrs  Immunization History  Administered Date(s) Administered   Influenza Inj Mdck Quad Pf 02/25/2017   Influenza,inj,Quad PF,6+ Mos 03/11/2018, 03/14/2019   PFIZER Comirnaty(Gray Top)Covid-19 Tri-Sucrose Vaccine 07/14/2019, 07/31/2019   Pneumococcal Conjugate-13 03/11/2018   Tdap 06/13/2014   Zoster Recombinat (Shingrix) 11/15/2019, 04/08/2020    HPI  Lab Results  Component Value Date   NA 141 04/08/2020   K 4.6 04/08/2020   CO2 24 04/08/2020   GLUCOSE 92 04/08/2020   BUN 10 04/08/2020   CREATININE 0.72 04/08/2020   CALCIUM 9.9 04/08/2020   GFRNONAA 92 04/08/2020   Lab Results  Component Value Date   CHOL 236 (H) 04/08/2020   HDL 62 04/08/2020   LDLCALC 152 (H) 04/08/2020   TRIG 125 04/08/2020   CHOLHDL 3.8 04/08/2020   Lab Results  Component Value Date   TSH 1.170 04/08/2020   No results found for: HGBA1C Lab Results  Component Value Date   WBC 10.4 04/08/2020   HGB 14.9 04/08/2020   HCT 43.8 04/08/2020   MCV 96 04/08/2020   PLT 333 04/08/2020   Lab Results  Component Value Date   ALT 10 04/08/2020   AST 13 04/08/2020   ALKPHOS 117 04/08/2020   BILITOT 0.3 04/08/2020   Lab Results  Component Value Date   VD25OH 45.5 03/14/2019     Review of Systems  Constitutional:  Negative for chills, fatigue and fever.  HENT:  Negative for congestion, hearing loss, tinnitus, trouble swallowing and voice change.   Eyes:  Negative for visual disturbance.  Respiratory:  Negative for cough, chest tightness, shortness of  breath and wheezing.   Cardiovascular:  Negative for chest pain, palpitations and leg swelling.  Gastrointestinal:  Negative for abdominal pain, constipation, diarrhea and vomiting.  Endocrine: Negative for polydipsia and polyuria.  Genitourinary:  Negative for dysuria, frequency, genital sores, vaginal bleeding and vaginal discharge.  Musculoskeletal:  Negative for arthralgias, gait problem and joint swelling.  Skin:  Negative for color change and rash.  Neurological:  Negative for dizziness, tremors, light-headedness and headaches.  Hematological:  Negative for adenopathy. Does not bruise/bleed easily.  Psychiatric/Behavioral:  Negative for dysphoric mood and sleep disturbance. The patient is not nervous/anxious.    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   Compulsive tobacco user syndrome 03/11/2015    Allergies  Allergen Reactions   Amoxicillin Itching    Past Surgical History:  Procedure Laterality Date   BUNIONECTOMY Bilateral    COLONOSCOPY WITH PROPOFOL N/A 03/12/2016   Procedure: COLONOSCOPY WITH PROPOFOL;  Surgeon: Lucilla Lame, MD;  Location:  [facility];  Service: Endoscopy;  Laterality: N/A;   COLONOSCOPY WITH PROPOFOL N/A 06/05/2019   Procedure: COLONOSCOPY WITH BIOPSIES;  Surgeon: Lucilla Lame, MD;  Location: [facility];  Service: Endoscopy;  Laterality: N/A;   LUMBAR DISC SURGERY     POLYPECTOMY  03/12/2016   Procedure: POLYPECTOMY;  Surgeon: Lucilla Lame, MD;  Location: [facility];  Service:  Endoscopy;;   POLYPECTOMY N/A 06/05/2019   Procedure: POLYPECTOMY;  Surgeon: Lucilla Lame, MD;  Location: [facility];  Service: Endoscopy;  Laterality: N/A;    Social History   Tobacco Use   Smoking status: Every Day    Packs/day: 1.00    Years: 40.00    Pack years: 40.00    Types: Cigarettes   Smokeless tobacco: Never  Vaping Use   Vaping Use: Never used  Substance Use Topics   Alcohol use: Yes    Alcohol/week: 2.0 standard drinks    Types: 2 Standard drinks or equivalent per week   Drug use: No     Medication list has been reviewed and updated.  Current Meds  Medication Sig   Ciclopirox 1 % shampoo Apply topically 3 times/day as needed-between meals & bedtime.    doxycycline (PERIOSTAT) 20 MG tablet Take 20-40 mg by mouth daily.   hydrOXYzine (ATARAX/VISTARIL) 25 MG tablet Take 1 tablet (25 mg total) by mouth every 8 (eight) hours as needed.   loratadine (CLARITIN REDITABS) 10 MG dissolvable tablet Take 1 tablet by mouth daily.   montelukast (SINGULAIR) 10 MG tablet TAKE 1 TABLET BY MOUTH EVERYDAY AT BEDTIME   Multiple Vitamin (MULTIVITAMIN) capsule Take 1 capsule by mouth daily.   triamcinolone (NASACORT) 55 MCG/ACT AERO nasal inhaler Place 2 sprays into the nose daily.   Vitamin D, Cholecalciferol, 50 MCG (2000 UT) CAPS Take 1,000 Units by mouth daily.    [DISCONTINUED] doxycycline (ORACEA) 40 MG capsule Take 20 mg by mouth every morning.    PHQ 2/9 Scores 04/15/2021 04/08/2020 11/15/2019 03/14/2019  PHQ - 2 Score 0 0 0 0  PHQ- 9 Score 0 0 0 2    GAD 7 : Generalized Anxiety Score 04/15/2021 04/08/2020 11/15/2019  Nervous, Anxious, on Edge 0 0 0  Control/stop worrying 0 0 0  Worry too much - different things 0 0 0  Trouble relaxing 0 0 0  Restless 0 0 0  Easily annoyed or irritable 0 0 0  Afraid - awful might happen 0 0 0  Total GAD 7 Score 0 0 0  Anxiety Difficulty Not difficult at all - Not difficult at all    BP Readings from Last 3 Encounters:   04/15/21 100/74  01/13/21 (!) 147/79  04/08/20 104/62    Physical Exam Vitals and nursing note reviewed.  Constitutional:      General: She is not in acute distress.    Appearance: She is well-developed.  HENT:     Head: Normocephalic and atraumatic.     Right Ear: Tympanic membrane and ear canal normal.     Left Ear: Tympanic membrane and ear canal normal.     Nose:     Right Sinus: No maxillary sinus tenderness.     Left Sinus: No maxillary sinus tenderness.  Eyes:     General: No scleral icterus.       Right eye: No discharge.        Left eye: No discharge.     Conjunctiva/sclera: Conjunctivae normal.  Neck:     Thyroid: No thyromegaly.  Vascular: No carotid bruit.  Cardiovascular:     Rate and Rhythm: Normal rate and regular rhythm.     Pulses: Normal pulses.     Heart sounds: Normal heart sounds.  Pulmonary:     Effort: Pulmonary effort is normal. No respiratory distress.     Breath sounds: No wheezing.  Chest:  Breasts:    Right: No mass, nipple discharge, skin change or tenderness.     Left: No mass, nipple discharge, skin change or tenderness.  Abdominal:     General: Bowel sounds are normal.     Palpations: Abdomen is soft.     Tenderness: There is no abdominal tenderness.  Musculoskeletal:     Cervical back: Normal range of motion. No erythema.     Right lower leg: No edema.     Left lower leg: No edema.  Lymphadenopathy:     Cervical: No cervical adenopathy.  Skin:    General: Skin is warm and dry.     Findings: No rash.  Neurological:     Mental Status: She is alert and oriented to person, place, and time.     Cranial Nerves: No cranial nerve deficit.     Sensory: No sensory deficit.     Deep Tendon Reflexes: Reflexes are normal and symmetric.  Psychiatric:        Attention and Perception: Attention normal.        Mood and Affect: Mood normal.    Wt Readings from Last 3 Encounters:  04/15/21 185 lb 6.4 oz (84.1 kg)  04/08/20 193 lb (87.5  kg)  11/15/19 197 lb (89.4 kg)    BP 100/74   Pulse 69   Ht 5\' 10"  (1.778 m)   Wt 185 lb 6.4 oz (84.1 kg)   SpO2 99%   BMI 26.60 kg/m   Assessment and Plan: 1. Annual physical exam Normal exam. Up to date on screenings and immunizations. She declines flu vaccine and additional Covid vaccines - CBC with Differential/Platelet - Comprehensive metabolic panel - Hemoglobin A1c  2. Encounter for screening mammogram for breast cancer Schedule at [facility]  3. Multinodular goiter (nontoxic) Normal exam - no symptoms noted other than modest weight loss due to diet changes - TSH + free T4  4. Acne erythematosa Followed by Dermatology  5. Mixed hyperlipidemia Check labs and advise - Lipid panel   Partially dictated using Dragon software. Any errors are unintentional.  Halina Maidens, MD [facility]  04/15/2021

## 2021-04-16 LAB — HEMOGLOBIN A1C
Est. average glucose Bld gHb Est-mCnc: 120 mg/dL
Hgb A1c MFr Bld: 5.8 % — ABNORMAL HIGH (ref 4.8–5.6)

## 2021-04-16 LAB — COMPREHENSIVE METABOLIC PANEL
ALT: 11 IU/L (ref 0–32)
AST: 14 IU/L (ref 0–40)
Albumin/Globulin Ratio: 2.3 — ABNORMAL HIGH (ref 1.2–2.2)
Albumin: 4.2 g/dL (ref 3.8–4.9)
Alkaline Phosphatase: 107 IU/L (ref 44–121)
BUN/Creatinine Ratio: 15 (ref 12–28)
BUN: 10 mg/dL (ref 8–27)
Bilirubin Total: 0.4 mg/dL (ref 0.0–1.2)
CO2: 26 mmol/L (ref 20–29)
Calcium: 9.7 mg/dL (ref 8.7–10.3)
Chloride: 104 mmol/L (ref 96–106)
Creatinine, Ser: 0.67 mg/dL (ref 0.57–1.00)
Globulin, Total: 1.8 g/dL (ref 1.5–4.5)
Glucose: 99 mg/dL (ref 70–99)
Potassium: 4.8 mmol/L (ref 3.5–5.2)
Sodium: 142 mmol/L (ref 134–144)
Total Protein: 6 g/dL (ref 6.0–8.5)
eGFR: 100 mL/min/{1.73_m2} (ref >59)

## 2021-04-16 LAB — CBC WITH DIFFERENTIAL/PLATELET
Basophils Absolute: 0.1 10*3/uL (ref 0.0–0.2)
Basos: 1 %
EOS (ABSOLUTE): 0.3 10*3/uL (ref 0.0–0.4)
Eos: 2 %
Hematocrit: 43 % (ref 34.0–46.6)
Hemoglobin: 14.6 g/dL (ref 11.1–15.9)
Immature Grans (Abs): 0 10*3/uL (ref 0.0–0.1)
Immature Granulocytes: 0 %
Lymphocytes Absolute: 2.9 10*3/uL (ref 0.7–3.1)
Lymphs: 22 %
MCH: 32.4 pg (ref 26.6–33.0)
MCHC: 34 g/dL (ref 31.5–35.7)
MCV: 95 fL (ref 79–97)
Monocytes Absolute: 0.9 10*3/uL (ref 0.1–0.9)
Monocytes: 7 %
Neutrophils Absolute: 8.9 10*3/uL — ABNORMAL HIGH (ref 1.4–7.0)
Neutrophils: 68 %
Platelets: 332 10*3/uL (ref 150–450)
RBC: 4.51 x10E6/uL (ref 3.77–5.28)
RDW: 11.2 % — ABNORMAL LOW (ref 11.7–15.4)
WBC: 13.1 10*3/uL — ABNORMAL HIGH (ref 3.4–10.8)

## 2021-04-16 LAB — LIPID PANEL
Chol/HDL Ratio: 3.9 ratio (ref 0.0–4.4)
Cholesterol, Total: 221 mg/dL — ABNORMAL HIGH (ref 100–199)
HDL: 57 mg/dL (ref >39)
LDL Chol Calc (NIH): 136 mg/dL — ABNORMAL HIGH (ref 0–99)
Triglycerides: 158 mg/dL — ABNORMAL HIGH (ref 0–149)
VLDL Cholesterol Cal: 28 mg/dL (ref 5–40)

## 2021-04-16 LAB — TSH+FREE T4
Free T4: 1.19 ng/dL (ref 0.82–1.77)
TSH: 1.06 u[IU]/mL (ref 0.450–4.500)

## 2021-04-22 ENCOUNTER — Telehealth: Payer: Self-pay

## 2021-04-22 NOTE — Telephone Encounter (Signed)
Called pt as a reminder to schedule mammogram. Pt verbalized understanding.  KP

## 2021-04-23 ENCOUNTER — Other Ambulatory Visit: Payer: Self-pay | Admitting: Internal Medicine

## 2021-04-23 DIAGNOSIS — J3089 Other allergic rhinitis: Secondary | ICD-10-CM

## 2021-04-23 NOTE — Telephone Encounter (Signed)
Requested Prescriptions  Pending Prescriptions Disp Refills   montelukast (SINGULAIR) 10 MG tablet [Pharmacy Med Name: MONTELUKAST SOD 10 MG TABLET] 90 tablet 3    Sig: TAKE 1 TABLET BY MOUTH EVERYDAY AT BEDTIME     Pulmonology:  Leukotriene Inhibitors Passed - 04/23/2021  1:42 AM      Passed - Valid encounter within last 12 months    Recent Outpatient Visits          1 week ago Annual physical exam   [facility] Glean Hess, MD   1 year ago Annual physical exam   [facility] Glean Hess, MD   1 year ago Acute conjunctivitis of both eyes, unspecified acute conjunctivitis type   [facility] Glean Hess, MD   2 years ago Annual physical exam   [facility] Glean Hess, MD   2 years ago H/O multiple pulmonary nodules   [facility] Glean Hess, MD      Future Appointments            In 12 months Army Melia Jesse Sans, MD [facility], [facility]

## 2021-04-27 ENCOUNTER — Telehealth: Payer: Managed Care, Other (non HMO) | Admitting: Family

## 2021-04-27 DIAGNOSIS — U071 COVID-19: Secondary | ICD-10-CM | POA: Diagnosis not present

## 2021-04-27 MED ORDER — MOLNUPIRAVIR EUA 200MG CAPSULE: 4.0000 | ORAL_CAPSULE | Freq: Two times a day (BID) | ORAL | 0 refills | Status: AC

## 2021-04-27 MED ORDER — BENZONATATE 100 MG PO CAPS: 100.0000 mg | ORAL_CAPSULE | Freq: Three times a day (TID) | ORAL | 0 refills | Status: DC | PRN

## 2021-04-27 NOTE — Progress Notes (Signed)
Virtual Visit Consent   Sharon [name], you are scheduled for a virtual visit with a [facility] provider today.     Just as with appointments in the office, your consent must be obtained to participate.  Your consent will be active for this visit and any virtual visit you may have with one of our providers in the next 365 days.     If you have a MyChart account, a copy of this consent can be sent to you electronically.  All virtual visits are billed to your insurance company just like a traditional visit in the office.    As this is a virtual visit, video technology does not allow for your provider to perform a traditional examination.  This may limit your provider's ability to fully assess your condition.  If your provider identifies any concerns that need to be evaluated in person or the need to arrange testing (such as labs, EKG, etc.), we will make arrangements to do so.     Although advances in technology are sophisticated, we cannot ensure that it will always work on either your end or our end.  If the connection with a video visit is poor, the visit may have to be switched to a telephone visit.  With either a video or telephone visit, we are not always able to ensure that we have a secure connection.     I need to obtain your verbal consent now.   Are you willing to proceed with your visit today?    Sharon [name] has provided verbal consent on 04/27/2021 for a virtual visit (video or telephone).   Evelina Dun, FNP   Date: 04/27/2021 10:02 AM   Virtual Visit via Video Note   I, Evelina Dun, connected with  Sharon [name]  ([id number], 1961-04-06) on 04/27/21 at 10:00 AM EST by a video-enabled telemedicine application and verified that I am speaking with the correct person using two identifiers.  Location: Patient: Virtual Visit Location Patient: Home Provider: Virtual Visit Location Provider: Home Office   I discussed the limitations of evaluation and management by  telemedicine and the availability of in person appointments. The patient expressed understanding and agreed to proceed.    History of Present Illness: Sharon [name] is a 60 y.o. who identifies as a female who was assigned female at birth, and is being seen today for COVID. She reports her symptoms started yesterday and she tested positive today.   HPI: Cough This is a new problem. The current episode started yesterday. The problem has been gradually worsening. The problem occurs every few minutes. The cough is Non-productive. Associated symptoms include ear congestion, ear pain (left), headaches, nasal congestion and postnasal drip. Pertinent negatives include no chills, fever, myalgias, shortness of breath or wheezing. Risk factors for lung disease include smoking/tobacco exposure. She has tried rest and OTC cough suppressant for the symptoms.   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 , Disp: , Rfl:    doxycycline (PERIOSTAT) 20 MG tablet, Take 20-40 mg by mouth daily., Disp: , Rfl:    hydrOXYzine (ATARAX/VISTARIL) 25 MG tablet, Take 1 tablet (25 mg total) by mouth every 8 (eight) hours as needed., Disp: 30 tablet, Rfl: 0   loratadine (CLARITIN REDITABS) 10 MG dissolvable tablet, Take 1 tablet by mouth daily., Disp: , Rfl:    montelukast (SINGULAIR) 10 MG tablet, TAKE 1 TABLET BY MOUTH EVERYDAY AT BEDTIME, Disp: 90 tablet, Rfl: 3   Multiple Vitamin (MULTIVITAMIN) capsule, Take 1 capsule by mouth daily., Disp: , Rfl:    triamcinolone (NASACORT) 55 MCG/ACT AERO nasal inhaler, Place 2 sprays into the nose daily., Disp: , Rfl:    Vitamin D, Cholecalciferol, 50 MCG (2000 UT) CAPS, Take 1,000 Units by mouth daily. , Disp: , Rfl:   Observations/Objective: Patient is well-developed, well-nourished in no acute distress.  Resting comfortably  at home.  Head is normocephalic, atraumatic.  No labored breathing.  Speech is clear and coherent with logical content.  Patient is alert and oriented at baseline.  Nasal congestion  Assessment and Plan: 1. COVID-19 - molnupiravir EUA (LAGEVRIO) 200 mg CAPS capsule; Take 4 capsules (800 mg total) by mouth 2 (two) times daily for 5 days.  Dispense: 40 capsule; Refill: 0 - benzonatate (TESSALON PERLES) 100 MG capsule; Take 1 capsule (100 mg total) by mouth 3 (three) times daily as needed.  Dispense: 20 capsule; Refill: 0  COVID positive, rest, force fluids, tylenol as needed, Quarantine for at least 5 days and you are fever free, then must wear a mask out in public from day 3-25, report any worsening symptoms such as increased shortness of breath, swelling, or continued high fevers. Possible adverse effects discussed with antivirals.    Follow Up Instructions: I discussed the assessment and treatment plan with the patient. The patient was provided an opportunity to ask questions and all were answered. The  patient agreed with the plan and demonstrated an understanding of the instructions.  A copy of instructions were sent to the patient via MyChart unless otherwise noted below.     The patient was advised to call back or seek an in-person evaluation if the symptoms worsen or if the condition fails to improve as anticipated.  Time:  I spent 6 minutes with the patient via telehealth technology discussing the above problems/concerns.    Evelina Dun, FNP

## 2021-04-27 NOTE — Patient Instructions (Signed)
10 Things You Can Do to Manage Your COVID-19 Symptoms at Home ?If you have possible or confirmed COVID-19 ?Stay home except to get medical care. ?Monitor your symptoms carefully. If your symptoms get worse, call your healthcare provider immediately. ?Get rest and stay hydrated. ?If you have a medical appointment, call the healthcare provider ahead of time and tell them that you have or may have COVID-19. ?For medical emergencies, call 911 and notify the dispatch personnel that you have or may have COVID-19. ?Cover your cough and sneezes with a tissue or use the inside of your elbow. ?Wash your hands often with soap and water for at least 20 seconds or clean your hands with an alcohol-based hand sanitizer that contains at least 60% alcohol. ?As much as possible, stay in a specific room and away from other people in your home. Also, you should use a separate bathroom, if available. If you need to be around other people in or outside of the home, wear a mask. ?Avoid sharing personal items with other people in your household, like dishes, towels, and bedding. ?Clean all surfaces that are touched often, like counters, tabletops, and doorknobs. Use household cleaning sprays or wipes according to the label instructions. ?cdc.gov/coronavirus ?11/24/2019 ?This information is not intended to replace advice given to you by your health care provider. Make sure you discuss any questions you have with your health care provider. ?Document Revised: 01/17/2021 Document Reviewed: 01/17/2021 ?Elsevier Patient Education ? 2022 Elsevier Inc. ? ?

## 2021-05-20 ENCOUNTER — Other Ambulatory Visit: Payer: Self-pay | Admitting: *Deleted

## 2021-05-20 DIAGNOSIS — F17210 Nicotine dependence, cigarettes, uncomplicated: Secondary | ICD-10-CM

## 2021-05-20 DIAGNOSIS — Z87891 Personal history of nicotine dependence: Secondary | ICD-10-CM

## 2021-06-02 ENCOUNTER — Ambulatory Visit
Admission: RE | Admit: 2021-06-02 | Discharge: 2021-06-02 | Disposition: A | Discharge status: Home / Self Care | Payer: Managed Care, Other (non HMO) | Source: Ambulatory Visit | Attending: Internal Medicine | Admitting: Internal Medicine

## 2021-06-02 ENCOUNTER — Other Ambulatory Visit: Payer: Self-pay

## 2021-06-02 DIAGNOSIS — Z1231 Encounter for screening mammogram for malignant neoplasm of breast: Secondary | ICD-10-CM | POA: Insufficient documentation

## 2021-06-09 ENCOUNTER — Ambulatory Visit (INDEPENDENT_AMBULATORY_CARE_PROVIDER_SITE_OTHER): Payer: Managed Care, Other (non HMO) | Admitting: Acute Care

## 2021-06-09 ENCOUNTER — Encounter: Payer: Self-pay | Admitting: Acute Care

## 2021-06-09 ENCOUNTER — Other Ambulatory Visit: Payer: Self-pay

## 2021-06-09 DIAGNOSIS — F17210 Nicotine dependence, cigarettes, uncomplicated: Secondary | ICD-10-CM

## 2021-06-09 NOTE — Progress Notes (Signed)
Virtual Visit via Telephone Note  I connected with Sharon [name] on 03/25/21 at  2:00 PM EST by telephone and verified that I am speaking with the correct person using two identifiers.  Location: Patient: Home Provider: Working from home   I discussed the limitations, risks, security and privacy concerns of performing an evaluation and management service by telephone and the availability of in person appointments. I also discussed with the patient that there may be a patient responsible charge related to this service. The patient expressed understanding and agreed to proceed.  Shared Decision Making Visit Lung Cancer Screening Program [phone])   Eligibility: Age 61 y.o. Pack Years Smoking History Calculation 45 (# packs/per year x # years smoked) Recent History of coughing up blood  no Unexplained weight loss? no ( >Than 15 pounds within the last 6 months ) Prior History Lung / other cancer no (Diagnosis within the last 5 years already requiring surveillance chest CT Scans). Smoking Status Current Smoker Former Smokers: Years since quit: NA  Quit Date: NA  Visit Components: Discussion included one or more decision making aids. yes Discussion included risk/benefits of screening. yes Discussion included potential follow up diagnostic testing for abnormal scans. yes Discussion included meaning and risk of over diagnosis. yes Discussion included meaning and risk of False Positives. yes Discussion included meaning of total radiation exposure. yes  Counseling Included: Importance of adherence to annual lung cancer LDCT screening. yes Impact of comorbidities on ability to participate in the program. yes Ability and willingness to under diagnostic treatment. yes  Smoking Cessation Counseling: Current Smokers:  Discussed importance of smoking cessation. yes Information about tobacco cessation classes and interventions provided to patient. yes Patient provided with "ticket" for  LDCT Scan. yes Symptomatic Patient. yes  Counseling(Intermediate counseling: > three minutes) [address]406 Diagnosis Code: Tobacco Use Z72.0 Asymptomatic Patient NA  Counseling NA Former Smokers:  Discussed the importance of maintaining cigarette abstinence. yes Diagnosis Code: Personal History of Nicotine Dependence. H47.425 Information about tobacco cessation classes and interventions provided to patient. Yes Patient provided with "ticket" for LDCT Scan. yes Written Order for Lung Cancer Screening with LDCT placed in Epic. Yes (CT Chest Lung Cancer Screening Low Dose W/O CM) [id number] Z12.2-Screening of respiratory organs Z87.891-Personal history of nicotine dependence   I spent 25 minutes of face to face time with her discussing the risks and benefits of lung cancer screening. We viewed a power point together that explained in detail the above noted topics. We took the time to pause the power point at intervals to allow for questions to be asked and answered to ensure understanding. We discussed that she had taken the single most powerful action possible to decrease her risk of developing lung cancer when he quit smoking. I counseled her to remain smoke free, and to contact me if she ever had the desire to smoke again so that I can provide resources and tools to help support the effort to remain smoke free. We discussed the time and location of the scan, and that either  Doroteo Glassman RN or I will call with the results within  24-48 hours of receiving them. She has my card and contact information in the event she needs to speak with me, in addition to a copy of the power point we reviewed as a resource. She verbalized understanding of all of the above and had no further questions upon leaving the office.     I explained to the patient that there has been a  high incidence of coronary artery disease noted on these exams. I explained that this is a non-gated exam therefore degree or severity cannot be  determined. This patient is not on statin therapy. I have asked the patient to follow-up with their PCP regarding any incidental finding of coronary artery disease and management with diet or medication as they feel is clinically indicated. The patient verbalized understanding of the above and had no further questions.   I spent 3 minutes counseling on smoking cessation and the health risks of continued tobacco abuse   Zakyia Gagan D. Kenton Kingfisher, NP-C [facility] Personal contact information can be found on Amion  06/09/2021, 11:37 AM

## 2021-06-09 NOTE — Patient Instructions (Signed)
Thank you for participating in the [facility]. °It was our pleasure to meet you today. °We will call you with the results of your scan within the next few days. °Your scan will be assigned a Lung RADS category score by the physicians reading the scans.  °This Lung RADS score determines follow up scanning.  °See below for description of categories, and follow up screening recommendations. °We will be in touch to schedule your follow up screening annually or based on recommendations of our providers. °We will fax a copy of your scan results to your Primary Care Physician, or the physician who referred you to the program, to ensure they have the results. °Please call the office if you have any questions or concerns regarding your scanning experience or results.  °Our office number is [phone]. °Please speak with Denise Phelps, RN. She is our Lung Cancer Screening RN. °If she is unavailable when you call, please have the office staff send her a message. She will return your call at her earliest convenience. °Remember, if your scan is normal, we will scan you annually as long as you continue to meet the criteria for the program. (Age 55-77, Current smoker or smoker who has quit within the last 15 years). °If you are a smoker, remember, quitting is the single most powerful action that you can take to decrease your risk of lung cancer and other pulmonary, breathing related problems. °We know quitting is hard, and we are here to help.  °Please let us know if there is anything we can do to help you meet your goal of quitting. °If you are a former smoker, congratulations. We are proud of you! Remain smoke free! °Remember you can refer friends or family members through the number above.  °We will screen them to make sure they meet criteria for the program. °Thank you for helping us take better care of you by participating in Lung Screening. ° °You can receive free nicotine replacement therapy  ( patches, gum or mints) by calling 1-800-QUIT NOW. Please call so we can get you on the path to becoming  a non-smoker. I know it is hard, but you can do this! ° °Lung RADS Categories: ° °Lung RADS 1: no nodules or definitely non-concerning nodules.  °Recommendation is for a repeat annual scan in 12 months. ° °Lung RADS 2:  nodules that are non-concerning in appearance and behavior with a very low likelihood of becoming an active cancer. °Recommendation is for a repeat annual scan in 12 months. ° °Lung RADS 3: nodules that are probably non-concerning , includes nodules with a low likelihood of becoming an active cancer.  Recommendation is for a 6-month repeat screening scan. Often noted after an upper respiratory illness. We will be in touch to make sure you have no questions, and to schedule your 6-month scan. ° °Lung RADS 4 A: nodules with concerning findings, recommendation is most often for a follow up scan in 3 months or additional testing based on our provider's assessment of the scan. We will be in touch to make sure you have no questions and to schedule the recommended 3 month follow up scan. ° °Lung RADS 4 B:  indicates findings that are concerning. We will be in touch with you to schedule additional diagnostic testing based on our provider's  assessment of the scan. ° °Hypnosis for smoking cessation  °[employer] °[phone] ° °Acupuncture for smoking cessation  °[facility] °[phone]  °

## 2021-06-11 ENCOUNTER — Other Ambulatory Visit: Payer: Self-pay

## 2021-06-11 ENCOUNTER — Ambulatory Visit
Admission: RE | Admit: 2021-06-11 | Discharge: 2021-06-11 | Disposition: A | Discharge status: Home / Self Care | Payer: Managed Care, Other (non HMO) | Source: Ambulatory Visit | Attending: Acute Care | Admitting: Acute Care

## 2021-06-11 DIAGNOSIS — F17210 Nicotine dependence, cigarettes, uncomplicated: Secondary | ICD-10-CM | POA: Diagnosis not present

## 2021-06-11 DIAGNOSIS — Z87891 Personal history of nicotine dependence: Secondary | ICD-10-CM | POA: Insufficient documentation

## 2021-06-13 ENCOUNTER — Other Ambulatory Visit: Payer: Self-pay

## 2021-06-13 ENCOUNTER — Encounter: Payer: Self-pay | Admitting: Internal Medicine

## 2021-06-13 DIAGNOSIS — I700 Atherosclerosis of aorta: Secondary | ICD-10-CM | POA: Insufficient documentation

## 2021-06-13 DIAGNOSIS — F17210 Nicotine dependence, cigarettes, uncomplicated: Secondary | ICD-10-CM

## 2021-06-13 DIAGNOSIS — Z87891 Personal history of nicotine dependence: Secondary | ICD-10-CM

## 2021-08-18 ENCOUNTER — Encounter: Payer: Self-pay | Admitting: Internal Medicine

## 2021-08-25 ENCOUNTER — Encounter: Payer: Self-pay | Admitting: Internal Medicine

## 2021-08-25 ENCOUNTER — Ambulatory Visit (INDEPENDENT_AMBULATORY_CARE_PROVIDER_SITE_OTHER): Payer: Managed Care, Other (non HMO) | Admitting: Internal Medicine

## 2021-08-25 VITALS — BP 134/72 | HR 87 | Ht 70.0 in | Wt 187.0 lb

## 2021-08-25 DIAGNOSIS — I700 Atherosclerosis of aorta: Secondary | ICD-10-CM | POA: Diagnosis not present

## 2021-08-25 DIAGNOSIS — I4510 Unspecified right bundle-branch block: Secondary | ICD-10-CM

## 2021-08-25 DIAGNOSIS — F17200 Nicotine dependence, unspecified, uncomplicated: Secondary | ICD-10-CM

## 2021-08-25 DIAGNOSIS — E782 Mixed hyperlipidemia: Secondary | ICD-10-CM | POA: Diagnosis not present

## 2021-08-25 NOTE — Progress Notes (Signed)
? ? ?Date:  08/25/2021  ? ?Name:  Sharon [name]   DOB:  18-Aug-1960   MRN:  [id number] ? ? ?Chief Complaint: Heart Problem (Having no symptoms ) ? ?Heart Problem ?This is a new problem. Episode onset: coronary artery calcifications seen on LDCT. Pertinent negatives include no abdominal pain, chest pain, coughing, fatigue, headaches or weakness.  ?Coronary artery calcifications seen on CT screening for lung cancer/pulmonary nodules. ? ?The 10-year ASCVD risk score (Arnett DK, et al., 2019) is: 7.9% ?  Values used to calculate the score: ?    Age: 61 years ?    Sex: Female ?    Is Non-Hispanic African American: No ?    Diabetic: No ?    Tobacco smoker: Yes ?    Systolic Blood Pressure: 948 mmHg ?    Is BP treated: No ?    HDL Cholesterol: 57 mg/dL ?    Total Cholesterol: 221 mg/dL ? ?Lab Results  ?Component Value Date  ? NA 142 04/15/2021  ? K 4.8 04/15/2021  ? CO2 26 04/15/2021  ? GLUCOSE 99 04/15/2021  ? BUN 10 04/15/2021  ? CREATININE 0.67 04/15/2021  ? CALCIUM 9.7 04/15/2021  ? EGFR 100 04/15/2021  ? GFRNONAA 92 04/08/2020  ? ?Lab Results  ?Component Value Date  ? CHOL 221 (H) 04/15/2021  ? HDL 57 04/15/2021  ? LDLCALC 136 (H) 04/15/2021  ? TRIG 158 (H) 04/15/2021  ? CHOLHDL 3.9 04/15/2021  ? ?Lab Results  ?Component Value Date  ? TSH 1.060 04/15/2021  ? ?Lab Results  ?Component Value Date  ? HGBA1C 5.8 (H) 04/15/2021  ? ?Lab Results  ?Component Value Date  ? WBC 13.1 (H) 04/15/2021  ? HGB 14.6 04/15/2021  ? HCT 43.0 04/15/2021  ? MCV 95 04/15/2021  ? PLT 332 04/15/2021  ? ?Lab Results  ?Component Value Date  ? ALT 11 04/15/2021  ? AST 14 04/15/2021  ? ALKPHOS 107 04/15/2021  ? BILITOT 0.4 04/15/2021  ? ?Lab Results  ?Component Value Date  ? VD25OH 45.5 03/14/2019  ?  ? ?Review of Systems  ?Constitutional:  Negative for fatigue and unexpected weight change.  ?HENT:  Negative for nosebleeds.   ?Eyes:  Negative for visual disturbance.  ?Respiratory:  Negative for cough, chest tightness, shortness of breath and  wheezing.   ?Cardiovascular:  Negative for chest pain, palpitations and leg swelling.  ?Gastrointestinal:  Negative for abdominal pain, constipation and diarrhea.  ?Neurological:  Negative for dizziness, weakness, light-headedness and headaches.  ? ?Patient Active Problem List  ? Diagnosis Date Noted  ? RBBB (right bundle branch block) 08/25/2021  ? Aortic atherosclerosis ([place]) 06/13/2021  ? Multinodular goiter (nontoxic) 04/08/2020  ? Personal history of colonic polyps   ? Mixed hyperlipidemia 03/11/2018  ? Multiple pulmonary nodules 03/11/2018  ? Family history of breast cancer   ? Vitamin D deficiency 09/28/2016  ? Increased risk of breast cancer 09/08/2016  ? Environmental and seasonal allergies 05/22/2016  ? Muscle spasms of neck 03/11/2015  ? Acne erythematosa 03/11/2015  ? Seborrhea capitis 03/11/2015  ? 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 Compulsive tobacco user syndrome 03/11/2015  ? ? ?Allergies  ?Allergen Reactions  ? Covid-19 (Mrna) Vaccine Swelling  ? Amoxicillin Itching  ? ? ?Past Surgical History:  ?Procedure Laterality Date  ? BUNIONECTOMY Bilateral   ? COLONOSCOPY WITH PROPOFOL N/A 03/12/2016  ? Procedure: COLONOSCOPY WITH PROPOFOL;  Surgeon: Lucilla Lame, MD;  Location: [place];  Service: Endoscopy;  Laterality: N/A;  ? COLONOSCOPY  WITH PROPOFOL N/A 06/05/2019  ? Procedure: COLONOSCOPY WITH BIOPSIES;  Surgeon: Lucilla Lame, MD;  Location: [facility];  Service: Endoscopy;  Laterality: N/A;  ? LUMBAR DISC SURGERY    ? POLYPECTOMY  03/12/2016  ? Procedure: POLYPECTOMY;  Surgeon: Lucilla Lame, MD;  Location: [facility];  Service: Endoscopy;;  ? POLYPECTOMY N/A 06/05/2019  ? Procedure: POLYPECTOMY;  Surgeon: Lucilla Lame, MD;  Location: [facility];  Service: Endoscopy;  Laterality: N/A;  ? ? ?Social History  ? ?Tobacco Use  ? Smoking status: Every Day  ?  Packs/day: 1.00  ?  Years: 45.00  ?  Pack years: 45.00  ?  Types: Cigarettes  ? Smokeless tobacco: Never  ?Vaping Use  ?  Vaping Use: Never used  ?Substance Use Topics  ? Alcohol use: Yes  ?  Alcohol/week: 2.0 standard drinks  ?  Types: 2 Standard drinks or equivalent per week  ? Drug use: No  ? ? ? ?Medication list has been reviewed and updated. ? ?Current Meds  ?Medication Sig  ? Ciclopirox 1 % shampoo Apply topically 3 times/day as needed-between meals & bedtime.   ? doxycycline (PERIOSTAT) 20 MG tablet Take 20-40 mg by mouth daily.  ? hydrOXYzine (ATARAX/VISTARIL) 25 MG tablet Take 1 tablet (25 mg total) by mouth every 8 (eight) hours as needed.  ? loratadine (CLARITIN REDITABS) 10 MG dissolvable tablet Take 1 tablet by mouth daily.  ? montelukast (SINGULAIR) 10 MG tablet TAKE 1 TABLET BY MOUTH EVERYDAY AT BEDTIME  ? Multiple Vitamin (MULTIVITAMIN) capsule Take 1 capsule by mouth daily.  ? triamcinolone (NASACORT) 55 MCG/ACT AERO nasal inhaler Place 2 sprays into the nose daily.  ? Vitamin D, Cholecalciferol, 50 MCG (2000 UT) CAPS Take 1,000 Units by mouth daily.   ? ? ? ?  08/25/2021  ?  1:23 PM 04/15/2021  ?  8:49 AM 04/08/2020  ?  8:36 AM 11/15/2019  ? 11:26 AM  ?GAD 7 : Generalized Anxiety Score  ?Nervous, Anxious, on Edge 0 0 0 0  ?Control/stop worrying 0 0 0 0  ?Worry too much - different things 0 0 0 0  ?Trouble relaxing 0 0 0 0  ?Restless 0 0 0 0  ?Easily annoyed or irritable 0 0 0 0  ?Afraid - awful might happen 0 0 0 0  ?Total GAD 7 Score 0 0 0 0  ?Anxiety Difficulty  Not difficult at all  Not difficult at all  ? ? ? ?  08/25/2021  ?  1:22 PM  ?Depression screen PHQ 2/9  ?Decreased Interest 0  ?Down, Depressed, Hopeless 0  ?PHQ - 2 Score 0  ?Altered sleeping 0  ?Tired, decreased energy 0  ?Change in appetite 0  ?Feeling bad or failure about yourself  0  ?Trouble concentrating 0  ?Moving slowly or fidgety/restless 0  ?Suicidal thoughts 0  ?PHQ-9 Score 0  ?Difficult doing work/chores Not difficult at all  ? ? ?BP Readings from Last 3 Encounters:  ?08/25/21 134/72  ?04/15/21 100/74  ?01/13/21 (!) 147/79  ? ? ?Physical  Exam ?Vitals and nursing note reviewed.  ?Constitutional:   ?   General: She is not in acute distress. ?   Appearance: She is well-developed.  ?HENT:  ?   Head: Normocephalic and atraumatic.  ?Neck:  ?   Vascular: No carotid bruit.  ?Cardiovascular:  ?   Rate and Rhythm: Normal rate and regular rhythm.  ?   Pulses: Normal pulses.  ?   Heart  sounds: No murmur heard. ?Pulmonary:  ?   Effort: Pulmonary effort is normal. No respiratory distress.  ?   Breath sounds: No wheezing or rhonchi.  ?Musculoskeletal:  ?   Cervical back: Normal range of motion.  ?Lymphadenopathy:  ?   Cervical: No cervical adenopathy.  ?Skin: ?   General: Skin is warm and dry.  ?   Capillary Refill: Capillary refill takes less than 2 seconds.  ?   Findings: No rash.  ?Neurological:  ?   Mental Status: She is alert and oriented to person, place, and time.  ?Psychiatric:     ?   Mood and Affect: Mood normal.     ?   Behavior: Behavior normal.  ? ? ?Wt Readings from Last 3 Encounters:  ?08/25/21 187 lb (84.8 kg)  ?04/15/21 185 lb 6.4 oz (84.1 kg)  ?04/08/20 193 lb (87.5 kg)  ? ? ?BP 134/72   Pulse 87   Ht _0  (1.778 m)   Wt 187 lb (84.8 kg)   SpO2 96%   BMI 26.83 kg/m?  ? ?Assessment and Plan: ?1. Aortic atherosclerosis ([place]) ?Seen on Chest CT ?10 yr risk is slightly above average at 7.9% ?She wants to work on smoking cessation and avoid statins ?- EKG 12-Lead: SR @ 79 RBBB - no change from 2018 ? ?2. Mixed hyperlipidemia ?Will continue to monitor. ?No medication for now. ?Continue low fat diet, work on regular exercise ? ?3. RBBB (right bundle branch block) ?Noted on EKG in the past - no change ? ?4. Compulsive tobacco user syndrome ?She will work on quitting and we will evaluate at next visit. ? ? ?Partially dictated using Editor, commissioning. Any errors are unintentional. ? ?Halina Maidens, MD ?[facility] ?[facility] ? ?08/25/2021 ? ? ? ? ?

## 2021-12-04 IMAGING — MG DIGITAL SCREENING BILAT W/ TOMO W/ CAD
8 series · 8 of 24 positions shown · non-contrast
Comparison: Previous exam(s).

CLINICAL DATA: Screening.

EXAM:
DIGITAL SCREENING BILATERAL MAMMOGRAM WITH TOMO AND CAD

[L MLO synth-2D]
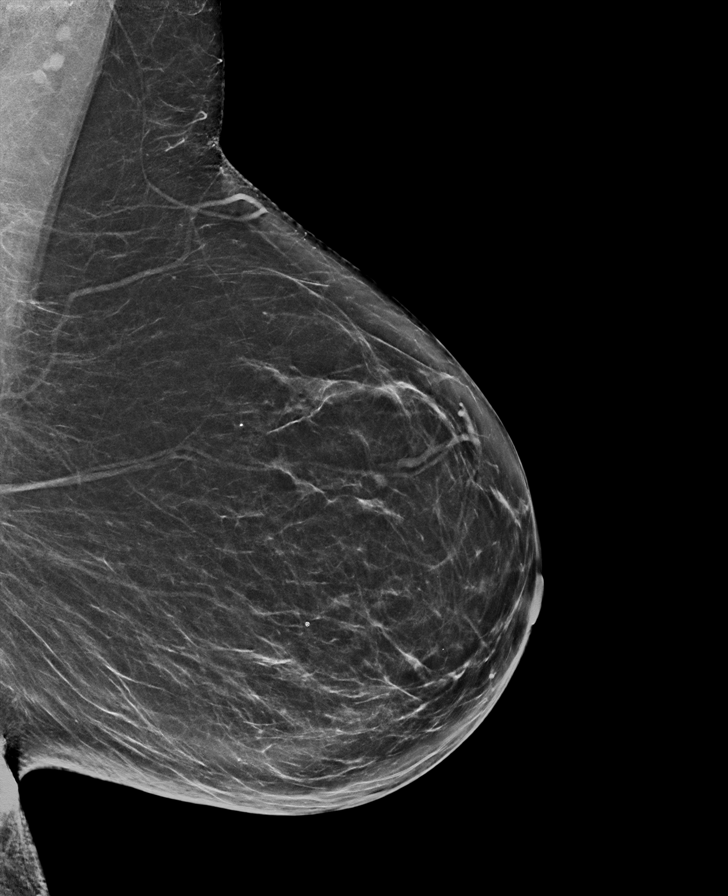

[R CC synth-2D]
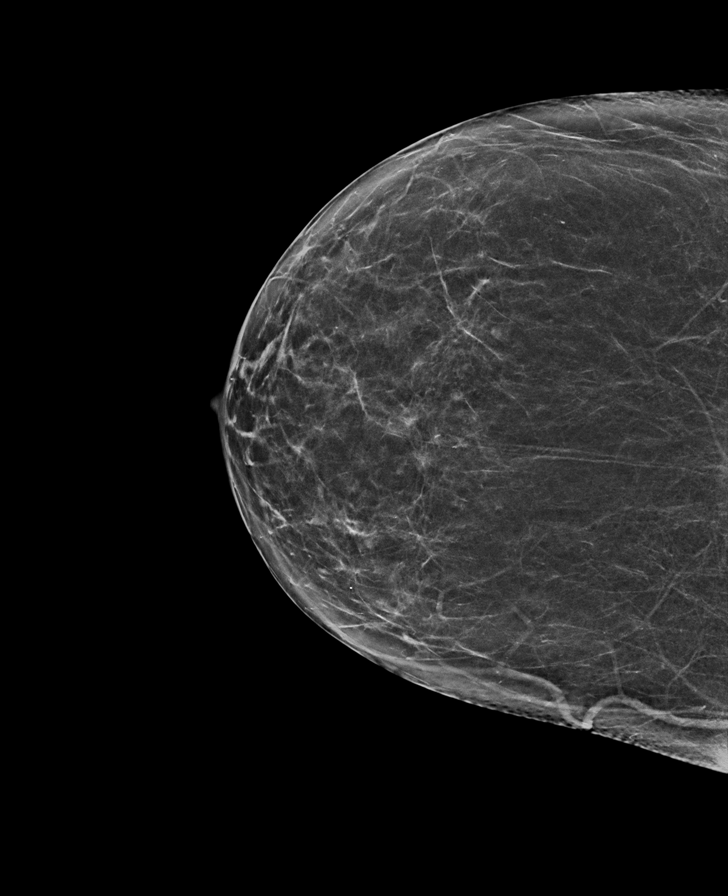

[R MLO synth-2D]
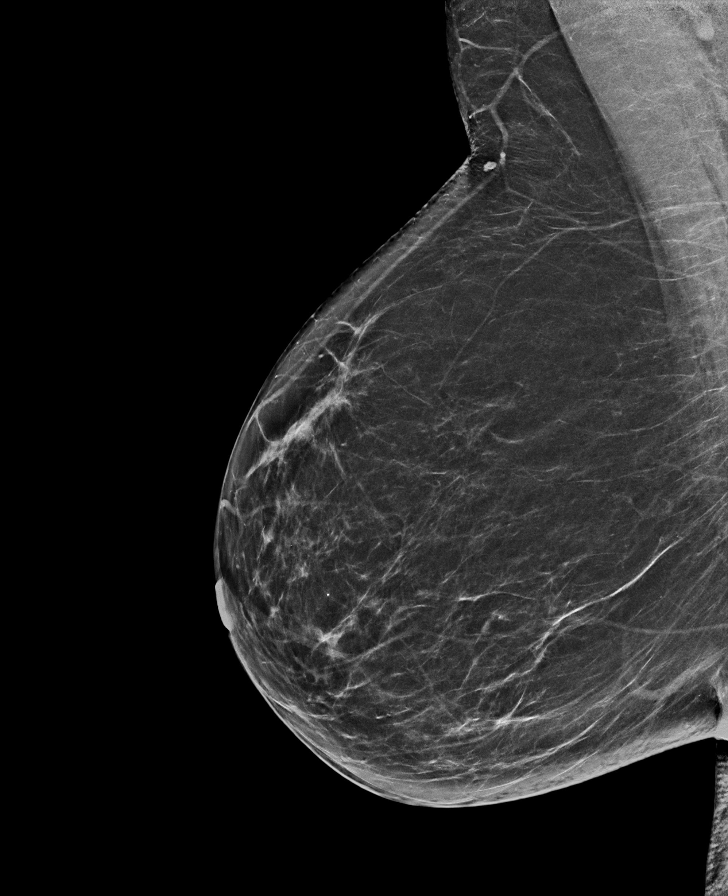

[L CC synth-2D]
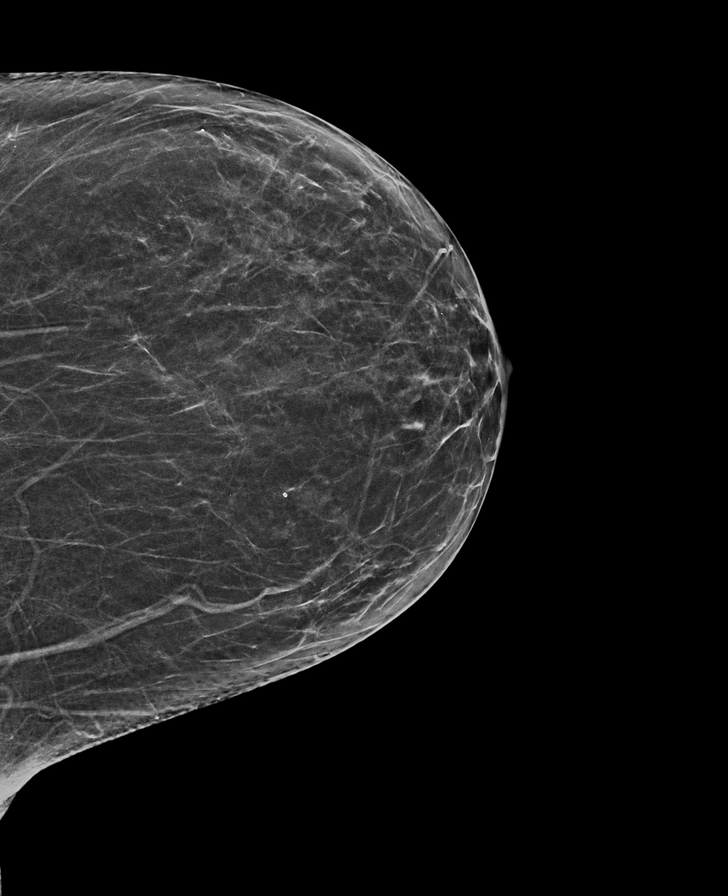

[L MLO tomo · tomo slice 37/72.0]
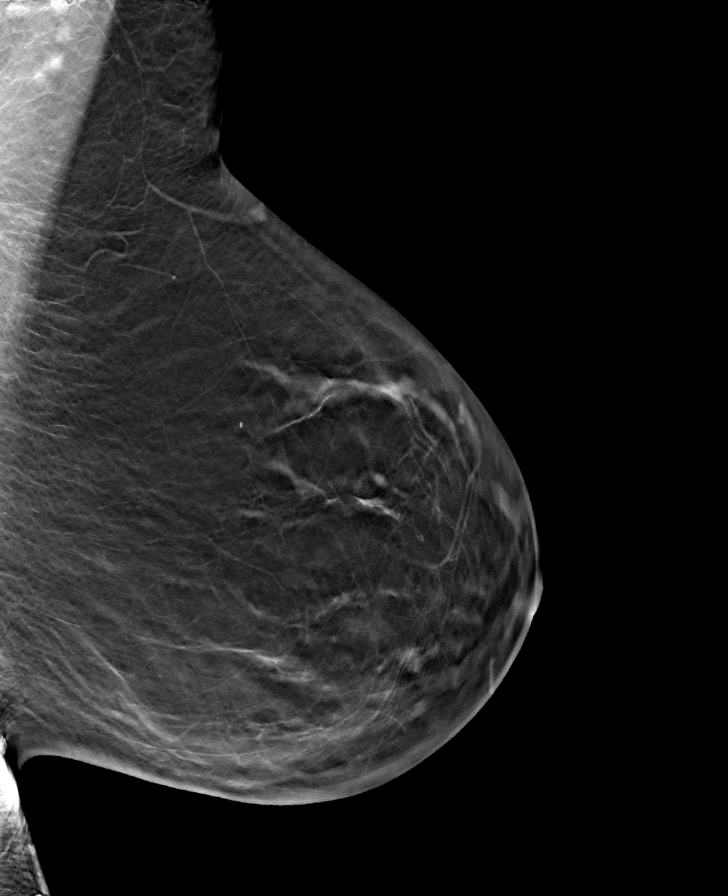

[R MLO tomo · tomo slice 36/71.0]
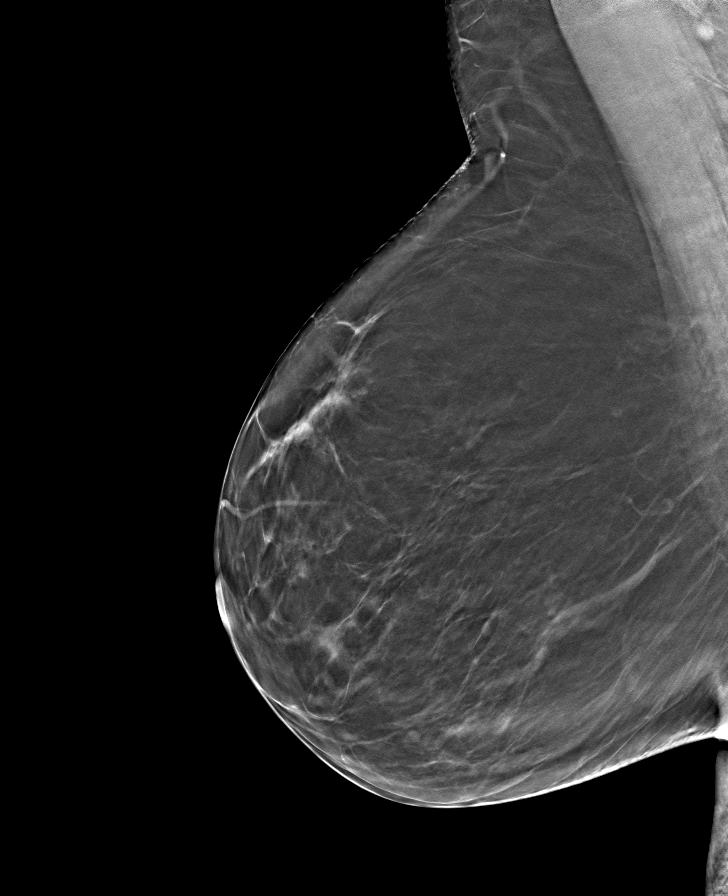

[L CC tomo · tomo slice 33/65.0]
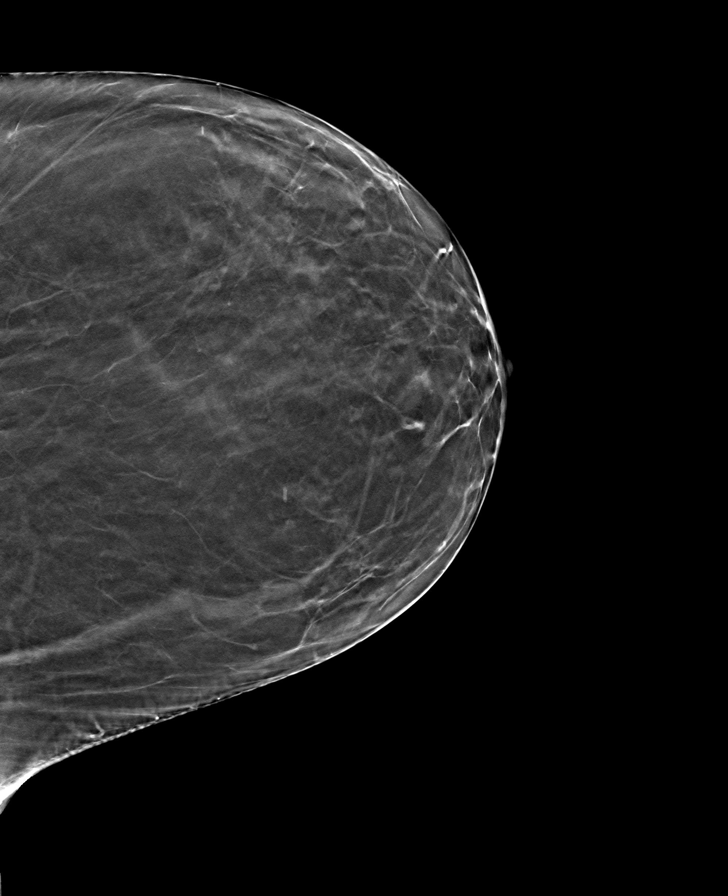

[R CC tomo · tomo slice 33/66.0]
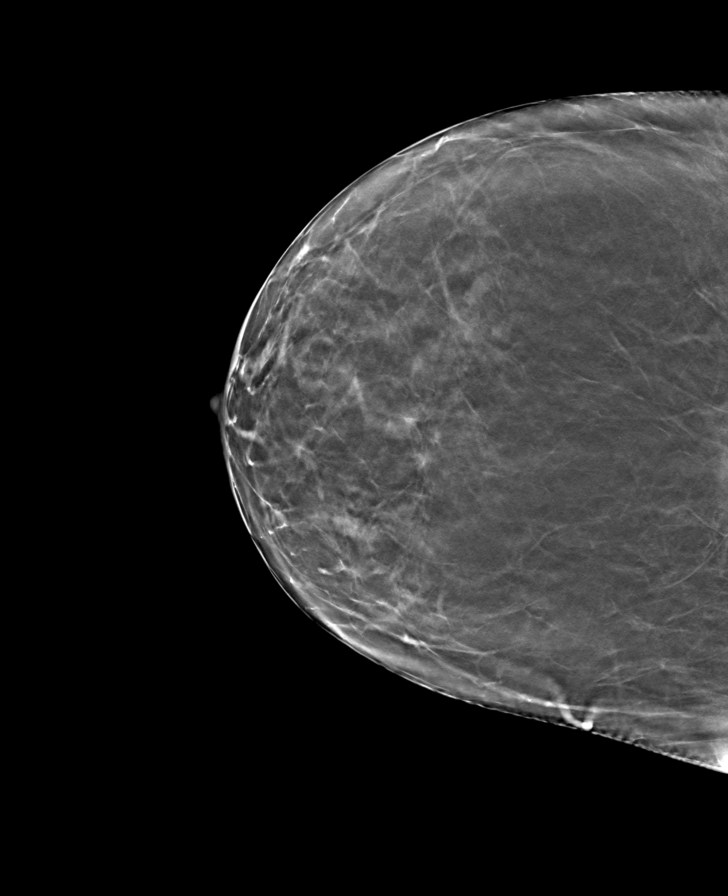

[8 of 24 positions shown; findings below may reference images not displayed]

ACR Breast Density Category b: There are scattered areas of
fibroglandular density.
FINDINGS: There are no findings suspicious for malignancy. Images were
processed with CAD.
IMPRESSION: No mammographic evidence of malignancy. A result letter of this
screening mammogram will be mailed directly to the patient.

RECOMMENDATION:
Screening mammogram in one year. (Code:[id number])

BI-RADS CATEGORY  1: Negative.

## 2022-04-20 ENCOUNTER — Ambulatory Visit (INDEPENDENT_AMBULATORY_CARE_PROVIDER_SITE_OTHER): Payer: Managed Care, Other (non HMO) | Admitting: Internal Medicine

## 2022-04-20 ENCOUNTER — Encounter: Payer: Self-pay | Admitting: Internal Medicine

## 2022-04-20 VITALS — BP 118/60 | HR 72 | Ht 70.0 in | Wt 189.0 lb

## 2022-04-20 DIAGNOSIS — Z0000 Encounter for general adult medical examination without abnormal findings: Secondary | ICD-10-CM | POA: Diagnosis not present

## 2022-04-20 DIAGNOSIS — E782 Mixed hyperlipidemia: Secondary | ICD-10-CM

## 2022-04-20 DIAGNOSIS — J3089 Other allergic rhinitis: Secondary | ICD-10-CM | POA: Diagnosis not present

## 2022-04-20 DIAGNOSIS — Z1231 Encounter for screening mammogram for malignant neoplasm of breast: Secondary | ICD-10-CM | POA: Diagnosis not present

## 2022-04-20 DIAGNOSIS — I700 Atherosclerosis of aorta: Secondary | ICD-10-CM

## 2022-04-20 DIAGNOSIS — F17200 Nicotine dependence, unspecified, uncomplicated: Secondary | ICD-10-CM

## 2022-04-20 DIAGNOSIS — R7303 Prediabetes: Secondary | ICD-10-CM | POA: Diagnosis not present

## 2022-04-20 DIAGNOSIS — E042 Nontoxic multinodular goiter: Secondary | ICD-10-CM

## 2022-04-20 MED ORDER — MONTELUKAST SODIUM 10 MG PO TABS: ORAL_TABLET | ORAL | 3 refills | Status: DC

## 2022-04-20 NOTE — Progress Notes (Signed)
Date:  04/20/2022   Name:  Sharon [name]   DOB:  [birth year]   MRN:  [id number]   Chief Complaint: Annual Exam Sharon [name] is a 61 y.o. female who presents today for her Complete Annual Exam. She feels well. She reports exercising - some. She reports she is sleeping well. Breast complaints - none.  Mammogram: 05/2021 DEXA: none Pap smear: 03/2022 neg/neg Colonoscopy: 05/2019 repeat 5 yrs  There are no preventive care reminders to display for this patient.   Immunization History  Administered Date(s) Administered   Influenza Inj Mdck Quad Pf 02/25/2017   Influenza,inj,Quad PF,6+ Mos 03/11/2018, 03/14/2019   PFIZER Comirnaty(Gray Top)Covid-19 Tri-Sucrose Vaccine 07/14/2019, 07/31/2019   Pneumococcal Conjugate-13 03/11/2018   Tdap 06/13/2014   Zoster Recombinat (Shingrix) 11/15/2019, 04/08/2020    Hyperlipidemia This is a chronic problem. The problem is uncontrolled. Recent lipid tests were reviewed and are high. Factors aggravating her hyperlipidemia include smoking. Associated symptoms include shortness of breath (with moderate exertion). Pertinent negatives include no chest pain. (Aortic atherosclerosis -) Current antihyperlipidemic treatment includes exercise and diet change.  Nicotine Dependence Presents for follow-up visit. Symptoms are negative for fatigue. Her urge triggers include company of smokers.  She has cut back on smoking to a slight degree.  Does not want to try Chantix.  Is doing LDCT scan screenings.  Lab Results  Component Value Date   NA 142 04/15/2021   K 4.8 04/15/2021   CO2 26 04/15/2021   GLUCOSE 99 04/15/2021   BUN 10 04/15/2021   CREATININE 0.67 04/15/2021   CALCIUM 9.7 04/15/2021   EGFR 100 04/15/2021   GFRNONAA 92 04/08/2020   Lab Results  Component Value Date   CHOL 221 (H) 04/15/2021   HDL 57 04/15/2021   LDLCALC 136 (H) 04/15/2021   TRIG 158 (H) 04/15/2021   CHOLHDL 3.9 04/15/2021   Lab Results  Component Value Date   TSH 1.060  04/15/2021   Lab Results  Component Value Date   HGBA1C 5.8 (H) 04/15/2021   Lab Results  Component Value Date   WBC 13.1 (H) 04/15/2021   HGB 14.6 04/15/2021   HCT 43.0 04/15/2021   MCV 95 04/15/2021   PLT 332 04/15/2021   Lab Results  Component Value Date   ALT 11 04/15/2021   AST 14 04/15/2021   ALKPHOS 107 04/15/2021   BILITOT 0.4 04/15/2021   Lab Results  Component Value Date   VD25OH 45.5 03/14/2019     Review of Systems  Constitutional:  Negative for chills, fatigue and fever.  HENT:  Negative for congestion, hearing loss, tinnitus, trouble swallowing and voice change.   Eyes:  Negative for visual disturbance.  Respiratory:  Positive for shortness of breath (with moderate exertion). Negative for cough, chest tightness and wheezing.   Cardiovascular:  Negative for chest pain, palpitations and leg swelling.  Gastrointestinal:  Negative for abdominal pain, constipation, diarrhea and vomiting.  Endocrine: Negative for polydipsia and polyuria.  Genitourinary:  Negative for dysuria, frequency, genital sores, vaginal bleeding and vaginal discharge.  Musculoskeletal:  Negative for arthralgias, gait problem and joint swelling.  Skin:  Negative for color change and rash.  Neurological:  Negative for dizziness, tremors, light-headedness and headaches.  Hematological:  Negative for adenopathy. Does not bruise/bleed easily.  Psychiatric/Behavioral:  Negative for dysphoric mood and sleep disturbance. The patient is not nervous/anxious.     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   Compulsive tobacco user syndrome 03/11/2015    Allergies  Allergen Reactions   Covid-19 (Mrna) Vaccine Swelling   Amoxicillin Itching    Past Surgical History:  Procedure Laterality Date   BUNIONECTOMY Bilateral    COLONOSCOPY WITH PROPOFOL N/A 03/12/2016   Procedure: COLONOSCOPY WITH PROPOFOL;  Surgeon: Lucilla Lame, MD;  Location: [facility];  Service: Endoscopy;  Laterality: N/A;   COLONOSCOPY WITH PROPOFOL N/A 06/05/2019   Procedure: COLONOSCOPY WITH BIOPSIES;  Surgeon: Lucilla Lame, MD;  Location: [place];  Service: Endoscopy;  Laterality: N/A;   LUMBAR DISC SURGERY     POLYPECTOMY  03/12/2016   Procedure: POLYPECTOMY;  Surgeon: Lucilla Lame, MD;  Location: [place];  Service: Endoscopy;;   POLYPECTOMY N/A 06/05/2019   Procedure: POLYPECTOMY;  Surgeon: Lucilla Lame, MD;  Location: [place] [facility];  Service: Endoscopy;  Laterality: N/A;    Social History   Tobacco Use   Smoking status: Every Day    Packs/day: 1.00    Years: 45.00    Total pack years: 45.00    Types: Cigarettes   Smokeless tobacco: Never  Vaping Use   Vaping Use: Never used  Substance Use Topics   Alcohol use: Yes    Alcohol/week: 2.0 standard drinks of alcohol    Types: 2 Standard drinks or equivalent per week   Drug use: No     Medication list has been reviewed and updated.  Current Meds  Medication Sig   Ciclopirox 1 % shampoo Apply topically 3 times/day as needed-between meals & bedtime.    doxycycline (PERIOSTAT) 20 MG tablet Take 20-40 mg by mouth daily.   loratadine (CLARITIN REDITABS) 10 MG dissolvable tablet Take 1 tablet by mouth daily.   montelukast (SINGULAIR) 10 MG tablet TAKE 1 TABLET BY MOUTH EVERYDAY AT BEDTIME   Multiple Vitamin (MULTIVITAMIN) capsule Take 1 capsule by mouth daily.   triamcinolone  (NASACORT) 55 MCG/ACT AERO nasal inhaler Place 2 sprays into the nose daily.   Vitamin D, Cholecalciferol, 50 MCG (2000 UT) CAPS Take 1,000 Units by mouth daily.    [DISCONTINUED] hydrOXYzine (ATARAX/VISTARIL) 25 MG tablet Take 1 tablet (25 mg total) by mouth every 8 (eight) hours as needed.       04/20/2022    8:44 AM 08/25/2021    1:23 PM 04/15/2021    8:49 AM 04/08/2020    8:36 AM  GAD 7 : Generalized Anxiety Score  Nervous, Anxious, on Edge 0 0 0 0  Control/stop worrying 0 0 0 0  Worry too much - different things 0 0 0 0  Trouble relaxing 0 0 0 0  Restless 0 0 0 0  Easily annoyed or irritable 0 0 0 0  Afraid - awful might happen 0 0 0 0  Total GAD 7 Score 0 0 0 0  Anxiety Difficulty Not difficult at all  Not difficult at all        04/20/2022    8:44 AM 08/25/2021    1:22 PM 04/15/2021  8:49 AM  Depression screen PHQ 2/9  Decreased Interest 0 0 0  Down, Depressed, Hopeless 0 0 0  PHQ - 2 Score 0 0 0  Altered sleeping 0 0 0  Tired, decreased energy 0 0 0  Change in appetite 0 0 0  Feeling bad or failure about yourself  0 0 0  Trouble concentrating 0 0 0  Moving slowly or fidgety/restless 0 0 0  Suicidal thoughts 0 0 0  PHQ-9 Score 0 0 0  Difficult doing work/chores Not difficult at all Not difficult at all Not difficult at all    BP Readings from Last 3 Encounters:  04/20/22 118/60  08/25/21 134/72  04/15/21 100/74    Physical Exam Vitals and nursing note reviewed.  Constitutional:      General: She is not in acute distress.    Appearance: She is well-developed.  HENT:     Head: Normocephalic and atraumatic.     Right Ear: Tympanic membrane and ear canal normal.     Left Ear: Tympanic membrane and ear canal normal.     Nose:     Right Sinus: No maxillary sinus tenderness.     Left Sinus: No maxillary sinus tenderness.  Eyes:     General: No scleral icterus.       Right eye: No discharge.        Left eye: No discharge.     Conjunctiva/sclera:  Conjunctivae normal.  Neck:     Thyroid: No thyromegaly.     Vascular: No carotid bruit.  Cardiovascular:     Rate and Rhythm: Normal rate and regular rhythm.     Pulses: Normal pulses.     Heart sounds: Normal heart sounds.  Pulmonary:     Effort: Pulmonary effort is normal. No respiratory distress.     Breath sounds: No wheezing.  Chest:  Breasts:    Right: No mass, nipple discharge, skin change or tenderness.     Left: No mass, nipple discharge, skin change or tenderness.  Abdominal:     General: Bowel sounds are normal.     Palpations: Abdomen is soft.     Tenderness: There is no abdominal tenderness.  Musculoskeletal:     Cervical back: Normal range of motion. No erythema.     Right lower leg: No edema.     Left lower leg: No edema.  Lymphadenopathy:     Cervical: No cervical adenopathy.  Skin:    General: Skin is warm and dry.     Findings: No rash.  Neurological:     Mental Status: She is alert and oriented to person, place, and time.     Cranial Nerves: No cranial nerve deficit.     Sensory: No sensory deficit.     Deep Tendon Reflexes: Reflexes are normal and symmetric.  Psychiatric:        Attention and Perception: Attention normal.        Mood and Affect: Mood normal.     Wt Readings from Last 3 Encounters:  04/20/22 189 lb (85.7 kg)  08/25/21 187 lb (84.8 kg)  04/15/21 185 lb 6.4 oz (84.1 kg)    BP 118/60   Pulse 72   Ht 5' 10" (1.778 m)   Wt 189 lb (85.7 kg)   SpO2 95%   BMI 27.12 kg/m   Assessment and Plan: 1. Annual physical exam Normal exam Declines flu vaccine, Covid vaccines - CBC with Differential/Platelet - Comprehensive metabolic panel - Hemoglobin A1c - Lipid  panel - TSH + free T4  2. Encounter for screening mammogram for breast cancer Schedule at [place] [facility]  3. Mixed hyperlipidemia Will check labs - if no improvement she will consider a statin. Low fat diet recommended - Lipid panel  4.  Prediabetes - Comprehensive metabolic panel - Hemoglobin A1c  5. Environmental and seasonal allergies - montelukast (SINGULAIR) 10 MG tablet; TAKE 1 TABLET BY MOUTH EVERYDAY AT BEDTIME  Dispense: 90 tablet; Refill: 3  6. Compulsive tobacco user syndrome Continues to smoke with minimal change Continue annual screenings - CBC with Differential/Platelet  7. Multinodular goiter (nontoxic) - TSH + free T4  8. Aortic atherosclerosis ([facility]) With CAD, tobacco use and HLD Pt agrees to Cardiology referral for risk stratification - Ambulatory referral to Cardiology   Partially dictated using Dragon software. Any errors are unintentional.  Halina Maidens, MD [facility]  04/20/2022

## 2022-04-20 NOTE — Patient Instructions (Signed)
Call [facility] to schedule your mammogram at [phone].  

## 2022-04-21 LAB — LIPID PANEL
Chol/HDL Ratio: 3.5 ratio (ref 0.0–4.4)
Cholesterol, Total: 219 mg/dL — ABNORMAL HIGH (ref 100–199)
HDL: 63 mg/dL (ref >39)
LDL Chol Calc (NIH): 132 mg/dL — ABNORMAL HIGH (ref 0–99)
Triglycerides: 136 mg/dL (ref 0–149)
VLDL Cholesterol Cal: 24 mg/dL (ref 5–40)

## 2022-04-21 LAB — COMPREHENSIVE METABOLIC PANEL
ALT: 11 IU/L (ref 0–32)
AST: 12 IU/L (ref 0–40)
Albumin/Globulin Ratio: 2.2 (ref 1.2–2.2)
Albumin: 4.1 g/dL (ref 3.9–4.9)
Alkaline Phosphatase: 115 IU/L (ref 44–121)
BUN/Creatinine Ratio: 14 (ref 12–28)
BUN: 9 mg/dL (ref 8–27)
Bilirubin Total: 0.3 mg/dL (ref 0.0–1.2)
CO2: 26 mmol/L (ref 20–29)
Calcium: 9.8 mg/dL (ref 8.7–10.3)
Chloride: 102 mmol/L (ref 96–106)
Creatinine, Ser: 0.66 mg/dL (ref 0.57–1.00)
Globulin, Total: 1.9 g/dL (ref 1.5–4.5)
Glucose: 100 mg/dL — ABNORMAL HIGH (ref 70–99)
Potassium: 4.4 mmol/L (ref 3.5–5.2)
Sodium: 144 mmol/L (ref 134–144)
Total Protein: 6 g/dL (ref 6.0–8.5)
eGFR: 100 mL/min/{1.73_m2} (ref >59)

## 2022-04-21 LAB — CBC WITH DIFFERENTIAL/PLATELET
Basophils Absolute: 0.1 10*3/uL (ref 0.0–0.2)
Basos: 1 %
EOS (ABSOLUTE): 0.3 10*3/uL (ref 0.0–0.4)
Eos: 3 %
Hematocrit: 44 % (ref 34.0–46.6)
Hemoglobin: 14.7 g/dL (ref 11.1–15.9)
Immature Grans (Abs): 0 10*3/uL (ref 0.0–0.1)
Immature Granulocytes: 0 %
Lymphocytes Absolute: 2.5 10*3/uL (ref 0.7–3.1)
Lymphs: 25 %
MCH: 32.8 pg (ref 26.6–33.0)
MCHC: 33.4 g/dL (ref 31.5–35.7)
MCV: 98 fL — ABNORMAL HIGH (ref 79–97)
Monocytes Absolute: 0.6 10*3/uL (ref 0.1–0.9)
Monocytes: 6 %
Neutrophils Absolute: 6.6 10*3/uL (ref 1.4–7.0)
Neutrophils: 65 %
Platelets: 322 10*3/uL (ref 150–450)
RBC: 4.48 x10E6/uL (ref 3.77–5.28)
RDW: 11.3 % — ABNORMAL LOW (ref 11.7–15.4)
WBC: 10.2 10*3/uL (ref 3.4–10.8)

## 2022-04-21 LAB — HEMOGLOBIN A1C
Est. average glucose Bld gHb Est-mCnc: 117 mg/dL
Hgb A1c MFr Bld: 5.7 % — ABNORMAL HIGH (ref 4.8–5.6)

## 2022-04-21 LAB — TSH+FREE T4
Free T4: 1.19 ng/dL (ref 0.82–1.77)
TSH: 0.937 u[IU]/mL (ref 0.450–4.500)

## 2022-06-03 ENCOUNTER — Ambulatory Visit: Payer: Managed Care, Other (non HMO)

## 2022-06-04 ENCOUNTER — Ambulatory Visit
Admission: RE | Admit: 2022-06-04 | Discharge: 2022-06-04 | Disposition: A | Discharge status: Home / Self Care | Payer: Managed Care, Other (non HMO) | Source: Ambulatory Visit | Attending: Internal Medicine | Admitting: Internal Medicine

## 2022-06-04 DIAGNOSIS — Z1231 Encounter for screening mammogram for malignant neoplasm of breast: Secondary | ICD-10-CM | POA: Insufficient documentation

## 2022-06-08 ENCOUNTER — Other Ambulatory Visit: Payer: Self-pay

## 2022-06-08 DIAGNOSIS — Z122 Encounter for screening for malignant neoplasm of respiratory organs: Secondary | ICD-10-CM

## 2022-06-08 DIAGNOSIS — F17210 Nicotine dependence, cigarettes, uncomplicated: Secondary | ICD-10-CM

## 2022-06-08 DIAGNOSIS — Z87891 Personal history of nicotine dependence: Secondary | ICD-10-CM

## 2022-06-11 ENCOUNTER — Ambulatory Visit: Payer: Managed Care, Other (non HMO) | Admitting: Cardiology

## 2022-06-11 ENCOUNTER — Ambulatory Visit: Payer: Managed Care, Other (non HMO)

## 2022-06-12 ENCOUNTER — Ambulatory Visit: Payer: Managed Care, Other (non HMO)

## 2022-06-17 ENCOUNTER — Other Ambulatory Visit: Payer: Self-pay | Admitting: Acute Care

## 2022-06-17 ENCOUNTER — Telehealth: Payer: Self-pay | Admitting: Acute Care

## 2022-06-17 ENCOUNTER — Ambulatory Visit
Admission: RE | Admit: 2022-06-17 | Discharge: 2022-06-17 | Disposition: A | Discharge status: Home / Self Care | Payer: Managed Care, Other (non HMO) | Source: Ambulatory Visit | Attending: Acute Care | Admitting: Acute Care

## 2022-06-17 DIAGNOSIS — R911 Solitary pulmonary nodule: Secondary | ICD-10-CM

## 2022-06-17 DIAGNOSIS — Z122 Encounter for screening for malignant neoplasm of respiratory organs: Secondary | ICD-10-CM

## 2022-06-17 DIAGNOSIS — Z87891 Personal history of nicotine dependence: Secondary | ICD-10-CM

## 2022-06-17 DIAGNOSIS — F17210 Nicotine dependence, cigarettes, uncomplicated: Secondary | ICD-10-CM

## 2022-06-17 NOTE — Telephone Encounter (Unsigned)
Received call report on LDCT done  IMPRESSION: 1. 15.1 mm posterior right lower lobe nodule, enlarged from 10.1 mm on 06/11/2021. Lung-RADS 4B, suspicious. Additional imaging evaluation or consultation with Pulmonology or Thoracic Surgery recommended. These results will be called to the ordering clinician or representative by the Radiologist Assistant, and communication documented in the PACS or [employer]. 2. Left adrenal adenoma. 3. Aortic atherosclerosis (ICD10-I70.0). Coronary artery calcification. 4.  Emphysema (ICD10-J43.9).     Electronically Signed   By: Lorin Picket M.D.     On: 06/17/2022 12:09

## 2022-06-17 NOTE — Telephone Encounter (Signed)
Will contact patient to schedule appt once PET has bene scheduled.

## 2022-06-17 NOTE — Telephone Encounter (Signed)
I have called the patient with the results of her low dose screening CT Chest. Her scan was read as a Lung RADS 4 B indicates suspicious findings for which additional diagnostic testing and or tissue sampling is recommended. There has been growth of a RLL nodule from 10.1 mm to 15.1 mm in the  last 12 months.  I have reviewed the scan with Dr. Patsey Berthold. She is in agreement with PET scan as the next step to better evaluate this nodule. I explained this to Sharon [name], and she is in agreement. I explained that she will get a call to schedule the scan , and then she will follow up with Dr. Patsey Berthold or Dr. Genia Harold after the scan has been completed at [facility] at [facility] .  Langley Gauss, please fax results to PCP and let them know plan is for PET scan and follow up with Dr. Darnell Level or Dgayli to review the results.  PET scan has been ordered. Thanks so much

## 2022-06-18 NOTE — Telephone Encounter (Signed)
Spoke to patient and scheduled appt for 07/08/22 at 10:15. Directions given.  Nothing further needed.

## 2022-06-18 NOTE — Telephone Encounter (Signed)
Results/plan faxed to PCP

## 2022-06-30 ENCOUNTER — Encounter
Admission: RE | Admit: 2022-06-30 | Discharge: 2022-06-30 | Disposition: A | Discharge status: Home / Self Care | Payer: Managed Care, Other (non HMO) | Source: Ambulatory Visit | Attending: Acute Care | Admitting: Acute Care

## 2022-06-30 DIAGNOSIS — R911 Solitary pulmonary nodule: Secondary | ICD-10-CM | POA: Diagnosis not present

## 2022-06-30 LAB — GLUCOSE, CAPILLARY: Glucose-Capillary: 115 mg/dL — ABNORMAL HIGH (ref 70–99)

## 2022-06-30 MED ORDER — FLUDEOXYGLUCOSE F - 18 (FDG) INJECTION
9.8000 | Freq: Once | INTRAVENOUS | Status: AC | PRN
  Administered 2022-06-30: 10.15 via INTRAVENOUS

## 2022-07-08 ENCOUNTER — Encounter: Payer: Self-pay | Admitting: Student in an Organized Health Care Education/Training Program

## 2022-07-08 ENCOUNTER — Telehealth: Payer: Self-pay

## 2022-07-08 ENCOUNTER — Ambulatory Visit: Payer: Managed Care, Other (non HMO) | Admitting: Student in an Organized Health Care Education/Training Program

## 2022-07-08 VITALS — BP 122/72 | HR 92 | Temp 97.9°F | Ht 70.0 in | Wt 192.4 lb

## 2022-07-08 DIAGNOSIS — R911 Solitary pulmonary nodule: Secondary | ICD-10-CM | POA: Diagnosis not present

## 2022-07-08 NOTE — Telephone Encounter (Signed)
Per epic secure chat- patient will need PFT.  PFT has been ordered by Dr. Genia Harold.  Patient is aware and voiced her understanding.  Nothing further needed.

## 2022-07-08 NOTE — Progress Notes (Addendum)
Synopsis: Referred in for pulmonary nodule by Glean Hess, MD  Assessment & Plan:   1. Lung nodule  Nodule Location: RLL Nodule Size: 15 mm Nodule Spiculation: No Associated Lymphadenopathy: No Smoking Status (current) and pack years (45) Extrathoracic cancer > 5 years prior (no) SPN malignancy risk score [facility]): 15 %risk of malignancy ECOG: 0  The patient is here to discuss their imaging abnormalities which include a RLL nodule that was noted to have grown on recent LDCT for lung cancer screening. The nodule was not avid on recent PET, but given appearance and growth, is concerning for malignancy.  We discussed the importance of diagnosis and staging in lung malignancies, and the approach to obtaining a tissue diagnosis which would include robotic assisted navigational bronchoscopy with endobronchial ultrasound guided sampling.  We also discussed the risks associated with the procedure which include a 2% risk of pneumothorax, infection, bleeding, and nondiagnostic procedure in detail.  I explained that patients typically are able to return home the same day of the procedure, but in rare cases admission to the hospital for observation and treatment is required.  After our discussion, the patient elected to proceed with the procedure  Recommendations: Robotic assisted navigational bronchoscopy with EBUS for mediastinal staging. Pulmonary function testing to risk stratify for any possible surgery   No follow-ups on file.  I spent 60 minutes caring for this patient today, including preparing to see the patient, obtaining a medical history , reviewing a separately obtained history, performing a medically appropriate examination and/or evaluation, counseling and educating the patient/family/caregiver, ordering medications, tests, or procedures, referring and communicating with other health care professionals (not separately reported), documenting clinical information in  the electronic health record, and independently interpreting results (not separately reported/billed) and communicating results to the patient/family/caregiver  Armando Reichert, MD [facility] 07/08/2022 10:57 AM    End of visit medications:  No orders of the defined types were placed in this encounter.    Current Outpatient Medications:    Ciclopirox 1 % shampoo, Apply topically 3 times/day as needed-between meals & bedtime. , Disp: , Rfl:    doxycycline (PERIOSTAT) 20 MG tablet, Take 20-40 mg by mouth daily., Disp: , Rfl:    loratadine (CLARITIN REDITABS) 10 MG dissolvable tablet, Take 1 tablet by mouth daily., Disp: , Rfl:    montelukast (SINGULAIR) 10 MG tablet, TAKE 1 TABLET BY MOUTH EVERYDAY AT BEDTIME, Disp: 90 tablet, Rfl: 3   Multiple Vitamin (MULTIVITAMIN) capsule, Take 1 capsule by mouth daily., Disp: , Rfl:    triamcinolone (NASACORT) 55 MCG/ACT AERO nasal inhaler, Place 2 sprays into the nose daily., Disp: , Rfl:    Vitamin D, Cholecalciferol, 50 MCG (2000 UT) CAPS, Take 1,000 Units by mouth daily. , Disp: , Rfl:    Subjective:   PATIENT ID: Sharon [name] GENDER: female DOB: 1960/08/20, MRN: [id number]  Chief Complaint  Patient presents with   pulmonary consult    Review PET-no current sx.     HPI  Sharon [name] is a pleasant 62 year old female with no significant past medical history who is presenting to clinic for the evaluation of a pulmonary nodule.  She reports no symptoms and is in her usual state of health. No shortness of breath, chest pain, or chest tightness reported. She has an occasional cough. No hemoptysis described. She has a history of allergies for which she takes Montelukast as well as Loratadine.  She was enrolled in the lung cancer  screening program and has had a recent CT scan that showed a RLL nodule to have grown from 10 mm to 15 mm. This was followed with a PET/CT that did not show the nodule to be PET avid.  Patient is a  lifelong smoker with at least 45 pack years of smoking history. She used to smoke 1.5 ppd and is now down to 1, with plans of cutting further and quitting.  CT Chest 09/2018: Persisting sub solid nodule of the right lower lobe, greater than 6 Mm CT Chest 03/2019: 1. 5 x 10 mm irregular parenchymal lesion in the right lower lobe is stable since 09/21/2018. CT Chest 03/2020:  Interval stability of indistinct subsolid right lower lobe 1.2 cm pulmonary nodule CT Chest 06/2021:  Scattered solid pulmonary nodules are unchanged in size when compared with prior CT including an irregular solid nodule of the right lower lobe measuring 10.1 mm in mean diameter CT Chest 06/2022: 15.1 mm posterior right lower lobe nodule, enlarged from 10.1 mm on 06/11/2021. Lung-RADS 4B, suspicious PET/CT 06/2022: The posterior right lower lobe pulmonary nodule of concern on recent lung cancer screening chest CT shows no hypermetabolism on PET imaging  Ancillary information including prior medications, full medical/surgical/family/social histories, and PFTs (when available) are listed below and have been reviewed.   Review of Systems  Constitutional:  Negative for chills, fever and weight loss.  Respiratory:  Positive for cough. Negative for hemoptysis, sputum production, shortness of breath and wheezing.   Cardiovascular:  Negative for chest pain.  Skin:  Negative for rash.     Objective:   Vitals:   07/08/22 1018  BP: 122/72  Pulse: 92  Temp: 97.9 F (36.6 C)  TempSrc: Temporal  SpO2: 97%  Weight: 192 lb 6.4 oz (87.3 kg)  Height: '5\' 10"'$  (1.778 m)   97% on RA  BMI Readings from Last 3 Encounters:  07/08/22 27.61 kg/m  04/20/22 27.12 kg/m  08/25/21 26.83 kg/m   Wt Readings from Last 3 Encounters:  07/08/22 192 lb 6.4 oz (87.3 kg)  04/20/22 189 lb (85.7 kg)  08/25/21 187 lb (84.8 kg)    Physical Exam Constitutional:      Appearance: Normal appearance. She is not ill-appearing.  HENT:     Head:  Normocephalic.     Mouth/Throat:     Mouth: Mucous membranes are moist.  Cardiovascular:     Rate and Rhythm: Normal rate and regular rhythm.     Pulses: Normal pulses.     Heart sounds: Normal heart sounds.  Pulmonary:     Effort: Pulmonary effort is normal. No respiratory distress.     Breath sounds: Normal breath sounds. No wheezing, rhonchi or rales.  Abdominal:     Palpations: Abdomen is soft.  Musculoskeletal:     Right lower leg: No edema.     Left lower leg: No edema.  Neurological:     General: No focal deficit present.     Mental Status: She is alert and oriented to person, place, and time. Mental status is at baseline.     Ancillary Information    Past Medical History:  Diagnosis Date   Benign neoplasm of ascending colon    BRCA negative 09/2016   My Risk neg   Disease of nail 03/11/2015   Encounter for nonprocreative genetic counseling    Family history of breast cancer    and pancreatic   Floppy mitral valve    told this "long ago".  No issues  Headache    sinus, seasonal   Increased risk of breast cancer 09/2016   IBIS=17.0%/riskscore=22.4%   Numbness in feet    s/p foot and back surgery   Polyp of sigmoid colon    Polyp of transverse colon    PONV (postoperative nausea and vomiting)    Rosacea    Special screening for malignant neoplasms, colon    Tendinitis of wrist 03/11/2015   Tinnitus August     Family History  Problem Relation Age of Onset   Breast cancer Maternal Grandmother 60       twice, bilat breast--88   Pancreatic cancer Maternal Uncle 60   Breast cancer Other    Breast cancer Other    Ovarian cancer Other      Past Surgical History:  Procedure Laterality Date   BUNIONECTOMY Bilateral    COLONOSCOPY WITH PROPOFOL N/A 03/12/2016   Procedure: COLONOSCOPY WITH PROPOFOL;  Surgeon: Lucilla Lame, MD;  Location: [place];  Service: Endoscopy;  Laterality: N/A;   COLONOSCOPY WITH PROPOFOL N/A 06/05/2019   Procedure:  COLONOSCOPY WITH BIOPSIES;  Surgeon: Lucilla Lame, MD;  Location: [place];  Service: Endoscopy;  Laterality: N/A;   LUMBAR DISC SURGERY     POLYPECTOMY  03/12/2016   Procedure: POLYPECTOMY;  Surgeon: Lucilla Lame, MD;  Location: [place];  Service: Endoscopy;;   POLYPECTOMY N/A 06/05/2019   Procedure: POLYPECTOMY;  Surgeon: Lucilla Lame, MD;  Location: [place];  Service: Endoscopy;  Laterality: N/A;    Social History   Socioeconomic History   Marital status: Married    Spouse name: Not on file   Number of children: Not on file   Years of education: Not on file   Highest education level: Not on file  Occupational History   Not on file  Tobacco Use   Smoking status: Every Day    Packs/day: 1.00    Years: 45.00    Total pack years: 45.00    Types: Cigarettes   Smokeless tobacco: Never  Vaping Use   Vaping Use: Never used  Substance and Sexual Activity   Alcohol use: Yes    Alcohol/week: 2.0 standard drinks of alcohol    Types: 2 Standard drinks or equivalent per week   Drug use: No   Sexual activity: Yes  Other Topics Concern   Not on file  Social History Narrative   Not on file   Social Determinants of Health   Financial Resource Strain: Not on file  Food Insecurity: Not on file  Transportation Needs: Not on file  Physical Activity: Not on file  Stress: Not on file  Social Connections: Not on file  Intimate Partner Violence: Not on file     Allergies  Allergen Reactions   Covid-19 (Mrna) Vaccine Swelling   Amoxicillin Itching     CBC    Component Value Date/Time   WBC 10.2 04/20/2022 0953   RBC 4.48 04/20/2022 0953   HGB 14.7 04/20/2022 0953   HCT 44.0 04/20/2022 0953   PLT 322 04/20/2022 0953   MCV 98 (H) 04/20/2022 0953   MCH 32.8 04/20/2022 0953   MCHC 33.4 04/20/2022 0953   RDW 11.3 (L) 04/20/2022 0953   LYMPHSABS 2.5 04/20/2022 0953   EOSABS 0.3 04/20/2022 0953   BASOSABS 0.1 04/20/2022 0953    Pulmonary  Functions Testing Results:     No data to display          Outpatient Medications Prior to Visit  Medication Sig  Dispense Refill   Ciclopirox 1 % shampoo Apply topically 3 times/day as needed-between meals & bedtime.      doxycycline (PERIOSTAT) 20 MG tablet Take 20-40 mg by mouth daily.     loratadine (CLARITIN REDITABS) 10 MG dissolvable tablet Take 1 tablet by mouth daily.     montelukast (SINGULAIR) 10 MG tablet TAKE 1 TABLET BY MOUTH EVERYDAY AT BEDTIME 90 tablet 3   Multiple Vitamin (MULTIVITAMIN) capsule Take 1 capsule by mouth daily.     triamcinolone (NASACORT) 55 MCG/ACT AERO nasal inhaler Place 2 sprays into the nose daily.     Vitamin D, Cholecalciferol, 50 MCG (2000 UT) CAPS Take 1,000 Units by mouth daily.      No facility-administered medications prior to visit.

## 2022-07-19 DIAGNOSIS — I2510 Atherosclerotic heart disease of native coronary artery without angina pectoris: Secondary | ICD-10-CM | POA: Insufficient documentation

## 2022-07-19 NOTE — Progress Notes (Unsigned)
Cardiology Office Note  Date:  07/20/2022   ID:  Sharon [name], DOB [birth year], MRN [id number]  PCP:  Glean Hess, MD   Chief Complaint  Patient presents with   New Patient (Initial Visit)    Patient c/o dealing with a lot of stress, she has noticed some mid-sternum pain that lasted one day & severe abdominal pain for about 1 week. Medications reviewed by the patient verbally.     HPI:  [name] is a 62 year old woman with past medical history of Smoking, 1 pack/day Aortic atherosclerosis Coronary calcification on CT scan Hyperlipidemia Who presents by referral from Dr. Halina Maidens for consultation of her aortic atherosclerosis  CT scan chest images pulled up and reviewed personally by myself on her visit today's Showing mild diffuse aortic atherosclerosis, mild coronary calcification  She denies significant chest pain concerning for angina  Recent episode of right upper quadrant discomfort radiating to mid epigastrium lasting 1 week Almost went to the hospital emergency room but symptoms eventually resolved without intervention  Tried to quit smoking, looking for various strategies, currently smoking 1 pack/day  EKG personally reviewed by myself on todays visit Normal sinus rhythm rate 80 bpm right bundle branch block no significant ST-T wave changes  PMH:   has a past medical history of Benign neoplasm of ascending colon, BRCA negative (09/2016), Disease of nail (03/11/2015), Encounter for nonprocreative genetic counseling, Family history of breast cancer, Floppy mitral valve, Headache, Increased risk of breast cancer (09/2016), Numbness in feet, Polyp of sigmoid colon, Polyp of transverse colon, PONV (postoperative nausea and vomiting), Rosacea, Special screening for malignant neoplasms, colon, Tendinitis of wrist (03/11/2015), and Tinnitus (August).  PSH:    Past Surgical History:  Procedure Laterality Date   BUNIONECTOMY Bilateral    COLONOSCOPY WITH  PROPOFOL N/A 03/12/2016   Procedure: COLONOSCOPY WITH PROPOFOL;  Surgeon: Lucilla Lame, MD;  Location: [place];  Service: Endoscopy;  Laterality: N/A;   COLONOSCOPY WITH PROPOFOL N/A 06/05/2019   Procedure: COLONOSCOPY WITH BIOPSIES;  Surgeon: Lucilla Lame, MD;  Location: [place];  Service: Endoscopy;  Laterality: N/A;   LUMBAR DISC SURGERY     POLYPECTOMY  03/12/2016   Procedure: POLYPECTOMY;  Surgeon: Lucilla Lame, MD;  Location: [place];  Service: Endoscopy;;   POLYPECTOMY N/A 06/05/2019   Procedure: POLYPECTOMY;  Surgeon: Lucilla Lame, MD;  Location: [place];  Service: Endoscopy;  Laterality: N/A;    Current Outpatient Medications  Medication Sig Dispense Refill   Ciclopirox 1 % shampoo Apply topically 3 times/day as needed-between meals & bedtime.      doxycycline (PERIOSTAT) 20 MG tablet Take 20-40 mg by mouth daily.     loratadine (CLARITIN REDITABS) 10 MG dissolvable tablet Take 1 tablet by mouth daily.     montelukast (SINGULAIR) 10 MG tablet TAKE 1 TABLET BY MOUTH EVERYDAY AT BEDTIME 90 tablet 3   Multiple Vitamin (MULTIVITAMIN) capsule Take 1 capsule by mouth daily.     triamcinolone (NASACORT) 55 MCG/ACT AERO nasal inhaler Place 2 sprays into the nose daily.     Vitamin D, Cholecalciferol, 50 MCG (2000 UT) CAPS Take 1,000 Units by mouth daily.      No current facility-administered medications for this visit.    Allergies:   Covid-19 (mrna) vaccine and Amoxicillin   Social History:  The patient  reports that she has been smoking cigarettes. She has a 45.00 pack-year smoking history. She has never used smokeless tobacco. She reports current alcohol use  of about 7.0 standard drinks of alcohol per week. She reports that she does not use drugs.   Family History:   family history includes Breast cancer in some other family members; Breast cancer (age of onset: 27) in her maternal grandmother; COPD in her mother; Heart attack in her father;  Heart disease in her father; Ovarian cancer in an other family member; Pancreatic cancer (age of onset: 58) in her maternal uncle; Pulmonary fibrosis in her mother.    Review of Systems: Review of Systems  Constitutional: Negative.   HENT: Negative.    Respiratory: Negative.    Cardiovascular: Negative.   Gastrointestinal: Negative.   Musculoskeletal: Negative.   Neurological: Negative.   Psychiatric/Behavioral: Negative.    All other systems reviewed and are negative.    PHYSICAL EXAM: VS:  BP 120/80 (BP Location: Left Arm, Patient Position: Sitting, Cuff Size: Normal)   Ht '5\' 10"'$  (1.778 m)   Wt 191 lb 8 oz (86.9 kg)   SpO2 95%   BMI 27.48 kg/m  , BMI Body mass index is 27.48 kg/m. GEN: Well nourished, well developed, in no acute distress HEENT: normal Neck: no JVD, carotid bruits, or masses Cardiac: RRR; no murmurs, rubs, or gallops,no edema  Respiratory:  clear to auscultation bilaterally, normal work of breathing GI: soft, nontender, nondistended, + BS MS: no deformity or atrophy Skin: warm and dry, no rash Neuro:  Strength and sensation are intact Psych: euthymic mood, full affect  Recent Labs: 04/20/2022: ALT 11; BUN 9; Creatinine, Ser 0.66; Hemoglobin 14.7; Platelets 322; Potassium 4.4; Sodium 144; TSH 0.937    Lipid Panel Lab Results  Component Value Date   CHOL 219 (H) 04/20/2022   HDL 63 04/20/2022   LDLCALC 132 (H) 04/20/2022   TRIG 136 04/20/2022      Wt Readings from Last 3 Encounters:  07/20/22 191 lb 8 oz (86.9 kg)  07/08/22 192 lb 6.4 oz (87.3 kg)  04/20/22 189 lb (85.7 kg)      ASSESSMENT AND PLAN:  Problem List Items Addressed This Visit       Cardiology Problems   Mixed hyperlipidemia (Chronic)   Aortic atherosclerosis (HCC) - Primary (Chronic)   Coronary artery calcification     Other   Compulsive tobacco user syndrome (Chronic)   Aortic atherosclerosis Mild disease predominantly in the arch, images reviewed Stressed  importance of smoking cessation, lipid management We have recommended statins but she prefers to take a long statin medication Recommend she start Zetia 10 mg daily Goal LDL less than 70, total cholesterol less than 150 If she does not reach goal on Zetia alone could potentially try Crestor 5 mg daily or every other day  Coronary calcification Currently with no symptoms of angina. No further workup at this time.  Start Zetia as above, in the future may need low-dose Crestor She is hoping to avoid statins  Abdominal pain Symptoms were severe lasting 1 week right upper quadrant radiating to upper epigastrium in the center Etiology unclear, unable to exclude gallbladder disease given gallstones on CT scan Recommend she talk with primary care for recurrent symptoms  Smoking Recommended Chantix (prescription sent in), with nicotine supplement Stressed the importance of smoking cessation given PAD   Total encounter time more than 60 minutes  Greater than 50% was spent in counseling and coordination of care with the patient  Patient seen in consultation for Dr. Army Melia and will be referred back to her office for ongoing care of the issues detailed above  Signed, [place] Plants, M.D., Ph.D. [place], [place]

## 2022-07-20 ENCOUNTER — Encounter: Payer: Self-pay | Admitting: Cardiovascular Disease

## 2022-07-20 ENCOUNTER — Ambulatory Visit: Payer: Managed Care, Other (non HMO) | Attending: Cardiology | Admitting: Cardiovascular Disease

## 2022-07-20 VITALS — BP 126/80 | HR 80 | Ht 70.0 in | Wt 191.5 lb

## 2022-07-20 DIAGNOSIS — F17200 Nicotine dependence, unspecified, uncomplicated: Secondary | ICD-10-CM

## 2022-07-20 DIAGNOSIS — I2510 Atherosclerotic heart disease of native coronary artery without angina pectoris: Secondary | ICD-10-CM | POA: Diagnosis not present

## 2022-07-20 DIAGNOSIS — I739 Peripheral vascular disease, unspecified: Secondary | ICD-10-CM

## 2022-07-20 DIAGNOSIS — E782 Mixed hyperlipidemia: Secondary | ICD-10-CM

## 2022-07-20 DIAGNOSIS — I700 Atherosclerosis of aorta: Secondary | ICD-10-CM | POA: Diagnosis not present

## 2022-07-20 DIAGNOSIS — I2584 Coronary atherosclerosis due to calcified coronary lesion: Secondary | ICD-10-CM

## 2022-07-20 MED ORDER — EZETIMIBE 10 MG PO TABS: 10.0000 mg | ORAL_TABLET | Freq: Every day | ORAL | 3 refills | Status: DC

## 2022-07-20 MED ORDER — VARENICLINE TARTRATE 1 MG PO TABS: 1.0000 mg | ORAL_TABLET | Freq: Two times a day (BID) | ORAL | 3 refills | Status: DC

## 2022-07-20 NOTE — Patient Instructions (Addendum)
Medication Instructions:  Please start zetia 10 mg daily for cholesterol  Please start chantix 0.5 mg twice a day for a few weeks Then up to 1 mg twice a day  If you need a refill on your cardiac medications before your next appointment, please call your pharmacy.   Lab work: No new labs needed  Testing/Procedures: No new testing needed  Follow-Up: At [facility], you and your health needs are our priority.  As part of our continuing mission to provide you with exceptional heart care, we have created designated Provider Care Teams.  These Care Teams include your primary Cardiologist (physician) and Advanced Practice Providers (APPs -  Physician Assistants and Nurse Practitioners) who all work together to provide you with the care you need, when you need it.  You will need a follow up appointment as needed  Providers on your designated Care Team:   Murray Hodgkins, NP Christell Faith, PA-C Cadence Kathlen Mody, [place]  COVID-19 Vaccine Information can be found at: ShippingScam.co.uk For questions related to vaccine distribution or appointments, please email [email or web address] .[email or web address] or call 7651774321.

## 2022-07-22 ENCOUNTER — Telehealth: Payer: Self-pay | Admitting: Student in an Organized Health Care Education/Training Program

## 2022-07-22 ENCOUNTER — Other Ambulatory Visit: Payer: Self-pay | Admitting: Student in an Organized Health Care Education/Training Program

## 2022-07-22 ENCOUNTER — Encounter: Payer: Self-pay | Admitting: Student in an Organized Health Care Education/Training Program

## 2022-07-22 DIAGNOSIS — R911 Solitary pulmonary nodule: Secondary | ICD-10-CM

## 2022-07-22 NOTE — Telephone Encounter (Signed)
We moved the case to 4/12 in the morning.

## 2022-07-22 NOTE — Telephone Encounter (Signed)
Called and spoke to patient.  She stated that 08/05/22 does not work with her schedule for bronchoscopy. She would like to reschedule and possibly avoid scheduling for 08/14/2022 if possible. She stated that the rest of April works for her.    Dr. Genia Harold, please advise. Thanks

## 2022-07-22 NOTE — Telephone Encounter (Signed)
Patient is aware of below date and voiced her understanding. Directions and times given.  Nothing further needed.

## 2022-07-30 ENCOUNTER — Telehealth: Payer: Self-pay | Admitting: Student in an Organized Health Care Education/Training Program

## 2022-07-30 DIAGNOSIS — R911 Solitary pulmonary nodule: Secondary | ICD-10-CM

## 2022-07-30 NOTE — Telephone Encounter (Signed)
Super D ordered. 

## 2022-07-30 NOTE — Telephone Encounter (Signed)
We had the patient scheduled to do her CT on 08/17/22 at [facility] her insurance will not approve this location and will only approve [facility] . The tech at [employer] states they don't do the CT Super D Chest [facility]. They don't know what it is. I think only the techs at [facility] know about this procedure. What do you want to do I think they can do the Super D Chest CT though

## 2022-07-30 NOTE — Telephone Encounter (Signed)
CT has been rescheduled at [facility] on 08/17/22 @ 11:00am and Sharon [name] is aware of the change

## 2022-07-30 NOTE — Telephone Encounter (Signed)
Sharon [name] can you put in order for Super D CT for this patient

## 2022-08-06 ENCOUNTER — Ambulatory Visit: Payer: Managed Care, Other (non HMO)

## 2022-08-17 ENCOUNTER — Ambulatory Visit: Payer: Managed Care, Other (non HMO)

## 2022-08-17 ENCOUNTER — Ambulatory Visit
Admission: RE | Admit: 2022-08-17 | Discharge: 2022-08-17 | Disposition: A | Discharge status: Home / Self Care | Payer: Managed Care, Other (non HMO) | Source: Ambulatory Visit | Attending: Student in an Organized Health Care Education/Training Program | Admitting: Student in an Organized Health Care Education/Training Program

## 2022-08-17 DIAGNOSIS — R911 Solitary pulmonary nodule: Secondary | ICD-10-CM

## 2022-08-20 ENCOUNTER — Encounter (HOSPITAL_COMMUNITY): Payer: Self-pay | Admitting: Student in an Organized Health Care Education/Training Program

## 2022-08-20 ENCOUNTER — Other Ambulatory Visit: Payer: Self-pay

## 2022-08-20 NOTE — Progress Notes (Signed)
SDW call  Patient was given pre-op instructions over the phone. Patient verbalized understanding of instructions provided.     PCP - Dr. Bari Edward Cardiologist - Dr. Dossie Arbour Pulmonary: Dr. Aundria Rud   PPM/ICD - Denies  Chest x-ray -  EKG -  07/20/2022 Stress Test - ECHO -  Cardiac Cath -   Sleep Study/sleep apnea/CPAP: Denies  Non-diabetic   Blood Thinner Instructions: Denies Aspirin Instructions: Denies   ERAS Protcol - No, NPO PRE-SURGERY Ensure or G2- No   COVID TEST- DOS, 08/21/2022    Anesthesia review: No   Patient denies shortness of breath, fever, cough and chest pain over the phone call    Your procedure is scheduled on Friday, August 21, 2022  Report to [facility] Main Entrance "A" at  0530  A.M., then check in with the Admitting office.  Call this number if you have problems the morning of surgery:  [phone]   If you have any questions prior to your surgery date call [phone]: Open Monday-Friday 8am-4pm If you experience any cold or flu symptoms such as cough, fever, chills, shortness of breath, etc. between now and your scheduled surgery, please notify us at the above number     Remember:  Do not eat or drink after midnight the night before your surgery   Take these medicines the morning of surgery with A SIP OF WATER:  Claritin, Nasocort  As needed: Tylenol, benadryl  As of today, STOP taking any Aspirin (unless otherwise instructed by your surgeon) Aleve, Naproxen, Ibuprofen, Motrin, Advil, Goody's, BC's, all herbal medications, fish oil, and all vitamins.

## 2022-08-20 NOTE — Anesthesia Preprocedure Evaluation (Addendum)
Anesthesia Evaluation  Patient identified by MRN, date of birth, ID band Patient awake    Reviewed: Allergy & Precautions, H&P , NPO status , Patient's Chart, lab work & pertinent test results  History of Anesthesia Complications (+) PONV and history of anesthetic complications  Airway Mallampati: II  TM Distance: >3 FB Neck ROM: Full    Dental no notable dental hx. (+) Teeth Intact, Dental Advisory Given   Pulmonary neg pulmonary ROS, Current Smoker and Patient abstained from smoking.   Pulmonary exam normal breath sounds clear to auscultation       Cardiovascular negative cardio ROS Normal cardiovascular exam Rhythm:Regular Rate:Normal     Neuro/Psych negative neurological ROS  negative psych ROS   GI/Hepatic negative GI ROS, Neg liver ROS,,,Colon ca   Endo/Other  negative endocrine ROS    Renal/GU negative Renal ROS  negative genitourinary   Musculoskeletal negative musculoskeletal ROS (+)    Abdominal   Peds negative pediatric ROS (+)  Hematology negative hematology ROS (+)   Anesthesia Other Findings All: amoxicillin, prednisone  Reproductive/Obstetrics negative OB ROS                             Anesthesia Physical Anesthesia Plan  ASA: 3  Anesthesia Plan: General   Post-op Pain Management:    Induction: Intravenous  PONV Risk Score and Plan: 3 and Treatment may vary due to age or medical condition, Midazolam and Ondansetron  Airway Management Planned: Oral ETT  Additional Equipment: None  Intra-op Plan:   Post-operative Plan: Extubation in OR  Informed Consent: I have reviewed the patients History and Physical, chart, labs and discussed the procedure including the risks, benefits and alternatives for the proposed anesthesia with the patient or authorized representative who has indicated his/her understanding and acceptance.     Dental advisory given  Plan  Discussed with: CRNA  Anesthesia Plan Comments:        Anesthesia Quick Evaluation

## 2022-08-21 ENCOUNTER — Ambulatory Visit (HOSPITAL_COMMUNITY): Payer: Managed Care, Other (non HMO) | Admitting: Anesthesiology

## 2022-08-21 ENCOUNTER — Ambulatory Visit (HOSPITAL_COMMUNITY): Payer: Managed Care, Other (non HMO)

## 2022-08-21 ENCOUNTER — Encounter (HOSPITAL_COMMUNITY)
Admission: RE | Disposition: A | Discharge status: Home / Self Care | Payer: Self-pay | Source: Home / Self Care | Attending: Student in an Organized Health Care Education/Training Program

## 2022-08-21 ENCOUNTER — Other Ambulatory Visit: Payer: Self-pay

## 2022-08-21 ENCOUNTER — Ambulatory Visit (HOSPITAL_BASED_OUTPATIENT_CLINIC_OR_DEPARTMENT_OTHER): Payer: Managed Care, Other (non HMO) | Admitting: Anesthesiology

## 2022-08-21 ENCOUNTER — Ambulatory Visit (HOSPITAL_COMMUNITY)
Admission: RE | Admit: 2022-08-21 | Discharge: 2022-08-21 | Disposition: A | Discharge status: Home / Self Care | Payer: Managed Care, Other (non HMO) | Attending: Student in an Organized Health Care Education/Training Program | Admitting: Student in an Organized Health Care Education/Training Program

## 2022-08-21 ENCOUNTER — Encounter (HOSPITAL_COMMUNITY): Payer: Self-pay | Admitting: Student in an Organized Health Care Education/Training Program

## 2022-08-21 DIAGNOSIS — Z803 Family history of malignant neoplasm of breast: Secondary | ICD-10-CM | POA: Insufficient documentation

## 2022-08-21 DIAGNOSIS — J439 Emphysema, unspecified: Secondary | ICD-10-CM | POA: Diagnosis not present

## 2022-08-21 DIAGNOSIS — R911 Solitary pulmonary nodule: Secondary | ICD-10-CM

## 2022-08-21 DIAGNOSIS — Z86010 Personal history of colonic polyps: Secondary | ICD-10-CM | POA: Insufficient documentation

## 2022-08-21 DIAGNOSIS — F17210 Nicotine dependence, cigarettes, uncomplicated: Secondary | ICD-10-CM | POA: Diagnosis not present

## 2022-08-21 DIAGNOSIS — Z1152 Encounter for screening for COVID-19: Secondary | ICD-10-CM | POA: Diagnosis not present

## 2022-08-21 DIAGNOSIS — C3431 Malignant neoplasm of lower lobe, right bronchus or lung: Secondary | ICD-10-CM | POA: Insufficient documentation

## 2022-08-21 DIAGNOSIS — J449 Chronic obstructive pulmonary disease, unspecified: Secondary | ICD-10-CM | POA: Diagnosis not present

## 2022-08-21 HISTORY — PX: VIDEO BRONCHOSCOPY WITH ENDOBRONCHIAL ULTRASOUND: SHX6177

## 2022-08-21 HISTORY — PX: BRONCHIAL BRUSHINGS: SHX5108

## 2022-08-21 HISTORY — PX: BRONCHIAL BIOPSY: SHX5109

## 2022-08-21 HISTORY — PX: BRONCHIAL WASHINGS: SHX5105

## 2022-08-21 HISTORY — PX: BRONCHIAL NEEDLE ASPIRATION BIOPSY: SHX5106

## 2022-08-21 HISTORY — DX: Chronic obstructive pulmonary disease, unspecified: J44.9

## 2022-08-21 LAB — CBC
HCT: 44.1 % (ref 36.0–46.0)
Hemoglobin: 14.4 g/dL (ref 12.0–15.0)
MCH: 32.6 pg (ref 26.0–34.0)
MCHC: 32.7 g/dL (ref 30.0–36.0)
MCV: 99.8 fL (ref 80.0–100.0)
Platelets: 311 10*3/uL (ref 150–400)
RBC: 4.42 MIL/uL (ref 3.87–5.11)
RDW: 12.5 % (ref 11.5–15.5)
WBC: 14.6 10*3/uL — ABNORMAL HIGH (ref 4.0–10.5)
nRBC: 0 % (ref 0.0–0.2)

## 2022-08-21 LAB — BASIC METABOLIC PANEL
Anion gap: 13 (ref 5–15)
BUN: 11 mg/dL (ref 8–23)
CO2: 21 mmol/L — ABNORMAL LOW (ref 22–32)
Calcium: 9.1 mg/dL (ref 8.9–10.3)
Chloride: 104 mmol/L (ref 98–111)
Creatinine, Ser: 0.74 mg/dL (ref 0.44–1.00)
GFR, Estimated: >60 mL/min (ref >60)
Glucose, Bld: 110 mg/dL — ABNORMAL HIGH (ref 70–99)
Potassium: 4.5 mmol/L (ref 3.5–5.1)
Sodium: 138 mmol/L (ref 135–145)

## 2022-08-21 LAB — BODY FLUID CELL COUNT WITH DIFFERENTIAL
Eos, Fluid: 0 %
Lymphs, Fluid: 50 %
Monocyte-Macrophage-Serous Fluid: 2 % — ABNORMAL LOW (ref 50–90)
Neutrophil Count, Fluid: 48 % — ABNORMAL HIGH (ref 0–25)
Total Nucleated Cell Count, Fluid: 36 cu mm (ref 0–1000)

## 2022-08-21 LAB — SARS CORONAVIRUS 2 BY RT PCR: SARS Coronavirus 2 by RT PCR: NEGATIVE

## 2022-08-21 SURGERY — BRONCHOSCOPY, WITH BIOPSY USING ELECTROMAGNETIC NAVIGATION
Anesthesia: General | Laterality: Right

## 2022-08-21 MED ORDER — ROCURONIUM BROMIDE 10 MG/ML (PF) SYRINGE
PREFILLED_SYRINGE | INTRAVENOUS | Status: DC | PRN
  Administered 2022-08-21: 50 mg via INTRAVENOUS

## 2022-08-21 MED ORDER — FENTANYL CITRATE (PF) 100 MCG/2ML IJ SOLN: 25.0000 ug | INTRAMUSCULAR | Status: DC | PRN

## 2022-08-21 MED ORDER — ONDANSETRON HCL 4 MG/2ML IJ SOLN: 4.0000 mg | Freq: Once | INTRAMUSCULAR | Status: DC | PRN

## 2022-08-21 MED ORDER — ACETAMINOPHEN 10 MG/ML IV SOLN: 1000.0000 mg | Freq: Once | INTRAVENOUS | Status: DC | PRN

## 2022-08-21 MED ORDER — CHLORHEXIDINE GLUCONATE 0.12 % MT SOLN
15.0000 mL | Freq: Once | OROMUCOSAL | Status: AC
  Administered 2022-08-21: 15 mL via OROMUCOSAL
  Filled 2022-08-21 (×2): qty 15

## 2022-08-21 MED ORDER — SUGAMMADEX SODIUM 200 MG/2ML IV SOLN
INTRAVENOUS | Status: DC | PRN
  Administered 2022-08-21: 200 mg via INTRAVENOUS

## 2022-08-21 MED ORDER — PHENYLEPHRINE 80 MCG/ML (10ML) SYRINGE FOR IV PUSH (FOR BLOOD PRESSURE SUPPORT)
PREFILLED_SYRINGE | INTRAVENOUS | Status: DC | PRN
  Administered 2022-08-21 (×3): 80 ug via INTRAVENOUS

## 2022-08-21 MED ORDER — LACTATED RINGERS IV SOLN: INTRAVENOUS | Status: DC

## 2022-08-21 MED ORDER — DEXAMETHASONE SODIUM PHOSPHATE 10 MG/ML IJ SOLN
INTRAMUSCULAR | Status: DC | PRN
  Administered 2022-08-21: 10 mg via INTRAVENOUS

## 2022-08-21 MED ORDER — LIDOCAINE 2% (20 MG/ML) 5 ML SYRINGE
INTRAMUSCULAR | Status: DC | PRN
  Administered 2022-08-21: 100 mg via INTRAVENOUS

## 2022-08-21 MED ORDER — MIDAZOLAM HCL 2 MG/2ML IJ SOLN
INTRAMUSCULAR | Status: DC | PRN
  Administered 2022-08-21: 2 mg via INTRAVENOUS

## 2022-08-21 MED ORDER — FENTANYL CITRATE (PF) 250 MCG/5ML IJ SOLN
INTRAMUSCULAR | Status: DC | PRN
  Administered 2022-08-21: 75 ug via INTRAVENOUS
  Administered 2022-08-21: 25 ug via INTRAVENOUS

## 2022-08-21 MED ORDER — PROPOFOL 500 MG/50ML IV EMUL
INTRAVENOUS | Status: DC | PRN
  Administered 2022-08-21: 125 ug/kg/min via INTRAVENOUS

## 2022-08-21 MED ORDER — ONDANSETRON HCL 4 MG/2ML IJ SOLN
INTRAMUSCULAR | Status: DC | PRN
  Administered 2022-08-21: 4 mg via INTRAVENOUS

## 2022-08-21 MED ORDER — PROPOFOL 10 MG/ML IV BOLUS
INTRAVENOUS | Status: DC | PRN
  Administered 2022-08-21: 140 mg via INTRAVENOUS

## 2022-08-21 NOTE — Anesthesia Procedure Notes (Signed)
Procedure Name: Intubation Date/Time: 08/21/2022 7:51 AM  Performed by: Randon Goldsmith, CRNAPre-anesthesia Checklist: Patient identified, Emergency Drugs available, Suction available and Patient being monitored Patient Re-evaluated:Patient Re-evaluated prior to induction Oxygen Delivery Method: Circle system utilized Preoxygenation: Pre-oxygenation with 100% oxygen Induction Type: IV induction Ventilation: Mask ventilation without difficulty Laryngoscope Size: Mac and 4 Grade View: Grade II Tube type: Oral Tube size: 8.5 mm Number of attempts: 1 Airway Equipment and Method: Stylet and Oral airway Placement Confirmation: ETT inserted through vocal cords under direct vision, positive ETCO2 and breath sounds checked- equal and bilateral Secured at: 21 cm Tube secured with: Tape Dental Injury: Teeth and Oropharynx as per pre-operative assessment

## 2022-08-21 NOTE — H&P (Signed)
Assessment & Plan:   1. Lung nodule   Nodule Location: RLL Nodule Size: 15 mm Nodule Spiculation: No Associated Lymphadenopathy: No Smoking Status (current) and pack years (45) Extrathoracic cancer > 5 years prior (no) SPN malignancy risk score [facility]): 15 %risk of malignancy ECOG: 0   The patient is here to discuss their imaging abnormalities which include a RLL nodule that was noted to have grown on recent LDCT for lung cancer screening. The nodule was not avid on recent PET, but given appearance and growth, is concerning for malignancy.   Patient is presenting for a robotic assisted navigational bronchoscopy.  -patient is appropriate for the procedure.   Raechel Chute, MD [place] [facility] 08/21/2022 7:37 AM    End of visit medications:  Meds ordered this encounter  Medications   lactated ringers infusion   chlorhexidine (PERIDEX) 0.12 % solution 15 mL     Current Facility-Administered Medications:    lactated ringers infusion, , Intravenous, Continuous, Trevor Iha, MD, New Bag at 08/21/22 0715   Subjective:   PATIENT ID: Sharon [name] GENDER: female DOB: May 12, 1960, MRN: [id number]  No chief complaint on file.   HPI  Sharon [name] is a pleasant 62 year old female with no significant past medical history who is presenting for the evaluation of a RLL nodule.   She reports no symptoms and is in her usual state of health. No shortness of breath, chest pain, or chest tightness reported. She has an occasional cough. No hemoptysis described. She has a history of allergies for which she takes Montelukast as well as Loratadine.   She was enrolled in the lung cancer screening program and has had a recent CT scan that showed a RLL nodule to have grown from 10 mm to 15 mm. This was followed with a PET/CT that did not show the nodule to be PET avid.   Patient is a lifelong smoker with at least 45 pack years of smoking history. She used to  smoke 1.5 ppd and is now down to 1, with plans of cutting further and quitting.   CT Chest 09/2018: Persisting sub solid nodule of the right lower lobe, greater than 6 Mm CT Chest 03/2019: 1. 5 x 10 mm irregular parenchymal lesion in the right lower lobe is stable since 09/21/2018. CT Chest 03/2020:  Interval stability of indistinct subsolid right lower lobe 1.2 cm pulmonary nodule CT Chest 06/2021:  Scattered solid pulmonary nodules are unchanged in size when compared with prior CT including an irregular solid nodule of the right lower lobe measuring 10.1 mm in mean diameter CT Chest 06/2022: 15.1 mm posterior right lower lobe nodule, enlarged from 10.1 mm on 06/11/2021. Lung-RADS 4B, suspicious PET/CT 06/2022: The posterior right lower lobe pulmonary nodule of concern on recent lung cancer screening chest CT shows no hypermetabolism on PET imaging    Ancillary information including prior medications, full medical/surgical/family/social histories, and PFTs (when available) are listed below and have been reviewed.   Review of Systems  Constitutional:  Negative for chills, fever, malaise/fatigue and weight loss.  Respiratory:  Negative for cough, hemoptysis, sputum production, shortness of breath and wheezing.   Cardiovascular:  Negative for chest pain and palpitations.     Objective:   Vitals:   08/20/22 1047 08/21/22 0600  BP:  (!) 153/91  Pulse:  70  Resp:  18  Temp:  98.2 F (36.8 C)  TempSrc:  Oral  SpO2:  96%  Weight: 88.5  kg 86.2 kg  Height:  (1.778 m)  (1.778 m)   96% on RA BMI Readings from Last 3 Encounters:  08/21/22 27.26 kg/m  07/20/22 27.48 kg/m  07/08/22 27.61 kg/m   Wt Readings from Last 3 Encounters:  08/21/22 86.2 kg  07/20/22 86.9 kg  07/08/22 87.3 kg    Physical Exam Constitutional:      General: She is not in acute distress.    Appearance: Normal appearance. She is not ill-appearing.  HENT:     Mouth/Throat:     Mouth: Mucous membranes  are moist.  Cardiovascular:     Rate and Rhythm: Normal rate and regular rhythm.     Pulses: Normal pulses.     Heart sounds: Normal heart sounds.  Pulmonary:     Effort: Pulmonary effort is normal.     Breath sounds: Normal breath sounds.  Abdominal:     Palpations: Abdomen is soft.  Neurological:     General: No focal deficit present.     Mental Status: She is alert and oriented to person, place, and time. Mental status is at baseline.       Ancillary Information    Past Medical History:  Diagnosis Date   Benign neoplasm of ascending colon    BRCA negative 09/2016   My Risk neg   COPD (chronic obstructive pulmonary disease)    emphysema   Disease of nail 03/11/2015   Encounter for nonprocreative genetic counseling    Family history of breast cancer    and pancreatic   Floppy mitral valve    told this "long ago".  No issues   Headache    sinus, seasonal   Increased risk of breast cancer 09/2016   IBIS=17.0%/riskscore=22.4%   Numbness in feet    s/p foot and back surgery   Polyp of sigmoid colon    Polyp of transverse colon    PONV (postoperative nausea and vomiting)    Rosacea    Special screening for malignant neoplasms, colon    Tendinitis of wrist 03/11/2015   Tinnitus 12/2020     Family History  Problem Relation Age of Onset   COPD Mother    Pulmonary fibrosis Mother    Heart attack Father    Heart disease Father    Pancreatic cancer Maternal Uncle 80   Breast cancer Maternal Grandmother 60       twice, bilat breast--88   Breast cancer Other    Breast cancer Other    Ovarian cancer Other      Past Surgical History:  Procedure Laterality Date   BUNIONECTOMY Bilateral    CESAREAN SECTION  1987   COLONOSCOPY WITH PROPOFOL N/A 03/12/2016   Procedure: COLONOSCOPY WITH PROPOFOL;  Surgeon: Midge Minium, MD;  Location[facility];  Service: Endoscopy;  Laterality: N/A;   COLONOSCOPY WITH PROPOFOL N/A 06/05/2019   Procedure: COLONOSCOPY WITH  BIOPSIES;  Surgeon: Midge Minium, MD;  Location: [facility];  Service: Endoscopy;  Laterality: N/A;   LUMBAR DISC SURGERY     POLYPECTOMY  03/12/2016   Procedure: POLYPECTOMY;  Surgeon: Midge Minium, MD;  Location: [facility];  Service: Endoscopy;;   POLYPECTOMY N/A 06/05/2019   Procedure: POLYPECTOMY;  Surgeon: Midge Minium, MD;  Location: [facility];  Service: Endoscopy;  Laterality: N/A;    Social History   Socioeconomic History   Marital status: Married    Spouse name: Rocky Link   Number of children: Not on file   Years of  education: Not on file   Highest education level: Not on file  Occupational History   Not on file  Tobacco Use   Smoking status: Every Day    Packs/day: 1.00    Years: 45.00    Additional pack years: 0.00    Total pack years: 45.00    Types: Cigarettes   Smokeless tobacco: Never  Vaping Use   Vaping Use: Never used  Substance and Sexual Activity   Alcohol use: Yes    Alcohol/week: 6.0 standard drinks of alcohol    Types: 4 Glasses of wine, 2 Standard drinks or equivalent per week   Drug use: No   Sexual activity: Yes  Other Topics Concern   Not on file  Social History Narrative   Not on file   Social Determinants of Health   Financial Resource Strain: Not on file  Food Insecurity: Not on file  Transportation Needs: Not on file  Physical Activity: Not on file  Stress: Not on file  Social Connections: Not on file  Intimate Partner Violence: Not on file     Allergies  Allergen Reactions   Covid-19 (Mrna) Vaccine Swelling   Amoxicillin Itching   Prednisone Other (See Comments)    "Makes me crazy"     CBC    Component Value Date/Time   WBC 14.6 (H) 08/21/2022 0604   RBC 4.42 08/21/2022 0604   HGB 14.4 08/21/2022 0604   HGB 14.7 04/20/2022 0953   HCT 44.1 08/21/2022 0604   HCT 44.0 04/20/2022 0953   PLT 311 08/21/2022 0604   PLT 322 04/20/2022 0953   MCV 99.8 08/21/2022 0604   MCV 98 (H) 04/20/2022 0953   MCH  32.6 08/21/2022 0604   MCHC 32.7 08/21/2022 0604   RDW 12.5 08/21/2022 0604   RDW 11.3 (L) 04/20/2022 0953   LYMPHSABS 2.5 04/20/2022 0953   EOSABS 0.3 04/20/2022 0953   BASOSABS 0.1 04/20/2022 0953    Pulmonary Functions Testing Results:     No data to display          @ENCMEDSTART @

## 2022-08-21 NOTE — Transfer of Care (Signed)
Immediate Anesthesia Transfer of Care Note  Patient: Sharon [name]  Procedure(s) Performed: ROBOTIC ASSISTED NAVIGATIONAL BRONCHOSCOPY (Right) VIDEO BRONCHOSCOPY WITH ENDOBRONCHIAL ULTRASOUND (Right) BRONCHIAL NEEDLE ASPIRATION BIOPSIES BRONCHIAL BRUSHINGS BRONCHIAL BIOPSIES BRONCHIAL WASHINGS  Patient Location: PACU  Anesthesia Type:General  Level of Consciousness: awake, alert , and oriented  Airway & Oxygen Therapy: Patient Spontanous Breathing and Patient connected to face mask oxygen  Post-op Assessment: Report given to RN, Post -op Vital signs reviewed and stable, and Patient moving all extremities X 4  Post vital signs: Reviewed and stable  Last Vitals:  Vitals Value Taken Time  BP 127/65 08/21/22 0849  Temp    Pulse 74 08/21/22 0850  Resp 21 08/21/22 0850  SpO2 100 % 08/21/22 0850  Vitals shown include unvalidated device data.  Last Pain:  Vitals:   08/21/22 0621  TempSrc:   PainSc: 0-No pain      Patients Stated Pain Goal: 0 (08/21/22 4315)  Complications: No notable events documented.

## 2022-08-21 NOTE — Op Note (Signed)
Video Bronchoscopy with Robotic Assisted Bronchoscopic Navigation   Date of Operation: 08/21/2022   Pre-op Diagnosis: pulmonary nodule  Post-op Diagnosis: pulmonary nodule  Surgeon: Raechel Chute, MD  Anesthesia: General endotracheal anesthesia  Operation: Flexible video fiberoptic bronchoscopy with robotic assistance and biopsies.  Estimated Blood Loss: Minimal  Complications: None  Indications and History: Sharon [name] is a 62 y.o. female with history of smoking who presents for the evaluation of a RLL pulmonary nodule. The risks, benefits, complications, treatment options and expected outcomes were discussed with the patient.  The possibilities of pneumothorax, pneumonia, reaction to medication, pulmonary aspiration, perforation of a viscus, bleeding, failure to diagnose a condition and creating a complication requiring transfusion or operation were discussed with the patient who freely signed the consent.    Description of Procedure: The patient was seen in the Preoperative Area, was examined and was deemed appropriate to proceed.  The patient was taken to [facility] endoscopy room 3, identified as Sharon [name] and the procedure verified as Flexible Video Fiberoptic Bronchoscopy.  A Time Out was held and the above information confirmed.   Prior to the date of the procedure a high-resolution CT scan of the chest was performed. Utilizing ION software program a virtual tracheobronchial tree was generated to allow the creation of distinct navigation pathways to the patient's parenchymal abnormalities. After being taken to the operating room general anesthesia was initiated and the patient  was orally intubated. The video fiberoptic bronchoscope was introduced via the endotracheal tube and a general inspection was performed which showed normal right and left lung anatomy, aspiration of the bilateral mainstems was completed to remove any remaining secretions. Robotic catheter inserted into  patient's endotracheal tube.   Target #1 RLL: The distinct navigation pathways prepared prior to this procedure were then utilized to navigate to patient's lesion identified on CT scan. The robotic catheter was secured into place and the vision probe was withdrawn.  Lesion location was approximated using fluoroscopy. We then utilized the CIOS spin cone beam system to obtain an intra-procedure CT scan. The scan was uploaded into the ION software intra-procedure and CT to body divergence was corrected for. Under fluoroscopic guidance transbronchial brushings and transbronchial needle biopsies (x10 passes) were obtained and sent for cytology. Given very close proximity to the pleural surface, I elected not to take transbronchial biopsies. A bronchioalveolar lavage was performed in the RLL, RB 9, and sent for cytology.  At the end of the procedure a general airway inspection was performed and there was no evidence of active bleeding. The bronchoscope was removed.  The patient tolerated the procedure well. There was no significant blood loss and there were no obvious complications. A post-procedural chest x-ray is pending.  Samples Target #1: 1. Transbronchial brushings from RLL nodule 2. Transbronchial Wang needle biopsies from RLL nodule 3. Bronchoalveolar lavage from RLL, RB9, 100 mL in, 25 out  Plans:  The patient will be discharged from the PACU to home when recovered from anesthesia and after chest x-ray is reviewed. We will review the cytology with the patient when they become available. Outpatient followup will be with me.  Raechel Chute, MD [place] [facility] 08/21/2022 8:39 AM

## 2022-08-21 NOTE — Anesthesia Postprocedure Evaluation (Signed)
Anesthesia Post Note  Patient: Sharon [name]  Procedure(s) Performed: ROBOTIC ASSISTED NAVIGATIONAL BRONCHOSCOPY (Right) VIDEO BRONCHOSCOPY WITH ENDOBRONCHIAL ULTRASOUND (Right) BRONCHIAL NEEDLE ASPIRATION BIOPSIES BRONCHIAL BRUSHINGS BRONCHIAL BIOPSIES BRONCHIAL WASHINGS     Patient location during evaluation: PACU Anesthesia Type: General Level of consciousness: awake and alert Pain management: pain level controlled Vital Signs Assessment: post-procedure vital signs reviewed and stable Respiratory status: spontaneous breathing, nonlabored ventilation, respiratory function stable and patient connected to nasal cannula oxygen Cardiovascular status: blood pressure returned to baseline and stable Postop Assessment: no apparent nausea or vomiting Anesthetic complications: no  No notable events documented.  Last Vitals:  Vitals:   08/21/22 0850 08/21/22 0930  BP: 127/65 (!) 146/74  Pulse: 72 75  Resp: (!) 24 15  Temp: 36.7 C 36.7 C  SpO2: 100% 90%    Last Pain:  Vitals:   08/21/22 0930  TempSrc:   PainSc: 0-No pain                 Trevor Iha

## 2022-08-23 ENCOUNTER — Telehealth: Payer: Self-pay | Admitting: Pulmonary Disease

## 2022-08-23 NOTE — Telephone Encounter (Signed)
Patient called answering service  Post bronchoscopy on Friday Ear fullness, stuffy head No fevers No significant cough  Encouraged antihistamine/decongestant  Encouraged to call office tomorrow if still not feeling any better

## 2022-08-24 ENCOUNTER — Encounter (HOSPITAL_COMMUNITY): Payer: Self-pay | Admitting: Student in an Organized Health Care Education/Training Program

## 2022-08-25 ENCOUNTER — Other Ambulatory Visit: Payer: Self-pay | Admitting: Student in an Organized Health Care Education/Training Program

## 2022-08-25 DIAGNOSIS — R911 Solitary pulmonary nodule: Secondary | ICD-10-CM

## 2022-08-25 LAB — CYTOLOGY - NON PAP

## 2022-08-25 NOTE — Progress Notes (Signed)
Patient called and updated regarding the finding from the RLL nodule biopsy showing adenocarcinoma (T1b, N0, M0; Stage IA2). Will refer to thoracic surgery for wedge resection and oncology for further workup and management.  Raechel Chute, MD [place] [facility] 08/25/2022 3:33 PM

## 2022-08-26 ENCOUNTER — Encounter: Payer: Self-pay | Admitting: *Deleted

## 2022-08-26 NOTE — Progress Notes (Signed)
Referral received from Dr. Aundria Rud to schedule pt with medical-oncology for further evaluation for RLL lung adenocarcinoma, stage 1. Spoke to Dr. Donneta Romberg who would like to see the patient this Friday prior to surgical evaluation that is scheduled on 4/26. Called pt and left message to schedule appt. Awaiting call back at this time but will go ahead and place on the schedule temporarily. Will confirm appt once pt calls back.

## 2022-08-28 ENCOUNTER — Inpatient Hospital Stay: Payer: Managed Care, Other (non HMO)

## 2022-08-28 ENCOUNTER — Encounter: Payer: Self-pay | Admitting: *Deleted

## 2022-08-28 ENCOUNTER — Inpatient Hospital Stay: Payer: Managed Care, Other (non HMO) | Attending: Internal Medicine | Admitting: Internal Medicine

## 2022-08-28 VITALS — BP 124/75 | HR 78 | Temp 98.7°F | Resp 18 | Ht 70.0 in | Wt 191.6 lb

## 2022-08-28 DIAGNOSIS — F17210 Nicotine dependence, cigarettes, uncomplicated: Secondary | ICD-10-CM | POA: Insufficient documentation

## 2022-08-28 DIAGNOSIS — C3431 Malignant neoplasm of lower lobe, right bronchus or lung: Secondary | ICD-10-CM | POA: Diagnosis present

## 2022-08-28 NOTE — Assessment & Plan Note (Addendum)
#   Adenocarcinoma right lower lobe-clinical stage I [s/p Bronch- Dr.Dgyali].  February 2024-PET scan mildly avid right lower lobe lung nodule [44mm]-no mediastinal or hilar adenopathy.  #Discussed that early stage cancers are treated with surgery or SBRT [if patient is not a surgical candidate].  Patient currently awaiting thoracic surgery evaluation.  Currently awaiting PFTs.  I also discussed at length regarding the role of adjuvant immunotherapy/chemotherapy-radiation only if high risk stage I or II and above.  Await final surgical pathology for final recommendations.   # Smoking: Active smoker-patient states that she is in the process of quitting smoking.  Discussed with the patient regarding the ill effects of smoking- including but not limited to cardiac lung and vascular diseases and malignancies.   # right upper quadrant pain [currently resolved]- Hx of gallstones-monitor for now if recurrent could consider evaluation with surgery.  # genetics-family history of pancreatic cancer/breast cancer-consider genetic evaluation down the line.  Thank you Dr.Dgyali for allowing me to participate in the care of your pleasant patient. Please do not hesitate to contact me with questions or concerns in the interim.  Discussed with [facility].  #Incidental findings on Imaging  CT , 2024:cholelithiasis; Aortic Atherosclerosis [s/p card evaluation] I reviewed/discussed/counseled the patient.   # DISPOSITION: # no labs Follow up TBD- Dr.B

## 2022-08-28 NOTE — Progress Notes (Signed)
Diagnosed through CT screening CT's. Pt has been smoking 1 1/2 packs cigarettes per day. Has dropped to 6 per day. She is chewing nicorette gum instead of the chantix. Has no symptoms at all. Does have seasonal allergies. Dr Cliffton Asters will be doing her surgery. Emotional today as her grandmother recently passed away.

## 2022-08-28 NOTE — Progress Notes (Signed)
[facility] CONSULT NOTE  Patient Care Team: Reubin Milan, MD as PCP - General (Internal Medicine) Jones Broom, MD as Referring Physician (Dermatology) Luella Cook Deloris Ping, MD (Obstetrics and Gynecology) Vernie Murders, MD (Otolaryngology) Glory Buff, RN as Oncology Nurse Navigator  CHIEF COMPLAINTS/PURPOSE OF CONSULTATION: lung cancer  #  Oncology History Overview Note  FEB 2024- PET scan:1. The posterior right lower lobe pulmonary nodule of concern on recent lung cancer screening chest CT shows no hypermetabolism on PET imaging today. While reassuring, low grade or well differentiated neoplasm can be poorly FDG avid.   APRIL 2024-  [Dr.Dgyali]LUNG, RLL, FINE NEEDLE ASPIRATION:  - Adenocarcinoma  - See comment   B. LUNG, RLL, BRUSHING:  - Adenocarcinoma  - See comment   COMMENT:  Sufficient tissue for molecular testing is present.  Dr. Venetia Night  reviewed the case and agrees with the above diagnosis.    Primary cancer of right lower lobe of lung  08/28/2022 Initial Diagnosis   Primary cancer of right lower lobe of lung   08/28/2022 Cancer Staging   Staging form: Lung, AJCC 8th Edition - Clinical: Stage IA2 (cT1b, cN0, cM0) - Signed by Earna Coder, MD on 08/28/2022      HISTORY OF PRESENTING ILLNESS: Patient ambulating-independently.  Alone.  Sharon [name] 62 y.o.  female history of smoking is here for further evaluation and recommendations for lung cancer.   Patient screening CAT scan noted to have progressive increase in the right lower lobe lung nodule.  This led to further imaging with a PET scan; as above.  Patient s/p evaluation with pulmonary s/p bronchoscopy.  Patient is also keen on quitting smoking.  She has recently bought nicotine gums.  Otherwise denies any unusual shortness of breath or cough.  Denies any fevers or chills.  No nausea no vomiting.  Review of Systems  Constitutional:  Negative for chills,  diaphoresis, fever, malaise/fatigue and weight loss.  HENT:  Negative for nosebleeds and sore throat.   Eyes:  Negative for double vision.  Respiratory:  Negative for cough, hemoptysis, sputum production, shortness of breath and wheezing.   Cardiovascular:  Negative for chest pain, palpitations, orthopnea and leg swelling.  Gastrointestinal:  Negative for abdominal pain, blood in stool, constipation, diarrhea, heartburn, melena, nausea and vomiting.  Genitourinary:  Negative for dysuria, frequency and urgency.  Musculoskeletal:  Negative for back pain and joint pain.  Skin: Negative.  Negative for itching and rash.  Neurological:  Negative for dizziness, tingling, focal weakness, weakness and headaches.  Endo/Heme/Allergies:  Does not bruise/bleed easily.  Psychiatric/Behavioral:  Negative for depression. The patient is not nervous/anxious and does not have insomnia.      MEDICAL HISTORY:  Past Medical History:  Diagnosis Date   Benign neoplasm of ascending colon    BRCA negative 09/2016   My Risk neg   COPD (chronic obstructive pulmonary disease)    emphysema   Disease of nail 03/11/2015   Encounter for nonprocreative genetic counseling    Family history of breast cancer    and pancreatic   Floppy mitral valve    told this "long ago".  No issues   Headache    sinus, seasonal   Increased risk of breast cancer 09/2016   IBIS=17.0%/riskscore=22.4%   Numbness in feet    s/p foot and back surgery   Polyp of sigmoid colon    Polyp of transverse colon    PONV (postoperative nausea and vomiting)    Rosacea  Special screening for malignant neoplasms, colon    Tendinitis of wrist 03/11/2015   Tinnitus 12/2020    SURGICAL HISTORY: Past Surgical History:  Procedure Laterality Date   BRONCHIAL BIOPSY  08/21/2022   Procedure: BRONCHIAL BIOPSIES;  Surgeon: Raechel Chute, MD;  Location: [facility];  Service: Pulmonary;;   BRONCHIAL BRUSHINGS  08/21/2022   Procedure: BRONCHIAL  BRUSHINGS;  Surgeon: Raechel Chute, MD;  Location: [facility];  Service: Pulmonary;;   BRONCHIAL NEEDLE ASPIRATION BIOPSY  08/21/2022   Procedure: BRONCHIAL NEEDLE ASPIRATION BIOPSIES;  Surgeon: Raechel Chute, MD;  Location: [facility];  Service: Pulmonary;;   BRONCHIAL WASHINGS  08/21/2022   Procedure: BRONCHIAL WASHINGS;  Surgeon: Raechel Chute, MD;  Location: [facility];  Service: Pulmonary;;   BUNIONECTOMY Bilateral    CESAREAN SECTION  1987   COLONOSCOPY WITH PROPOFOL N/A 03/12/2016   Procedure: COLONOSCOPY WITH PROPOFOL;  Surgeon: Midge Minium, MD;  Location: [facility];  Service: Endoscopy;  Laterality: N/A;   COLONOSCOPY WITH PROPOFOL N/A 06/05/2019   Procedure: COLONOSCOPY WITH BIOPSIES;  Surgeon: Midge Minium, MD;  Location: [facility];  Service: Endoscopy;  Laterality: N/A;   LUMBAR DISC SURGERY     POLYPECTOMY  03/12/2016   Procedure: POLYPECTOMY;  Surgeon: Midge Minium, MD;  Location: [facility];  Service: Endoscopy;;   POLYPECTOMY N/A 06/05/2019   Procedure: POLYPECTOMY;  Surgeon: Midge Minium, MD;  Location: [facility];  Service: Endoscopy;  Laterality: N/A;   VIDEO BRONCHOSCOPY WITH ENDOBRONCHIAL ULTRASOUND Right 08/21/2022   Procedure: VIDEO BRONCHOSCOPY WITH ENDOBRONCHIAL ULTRASOUND;  Surgeon: Raechel Chute, MD;  Location: [facility];  Service: Pulmonary;  Laterality: Right;    SOCIAL HISTORY: Social History   Socioeconomic History   Marital status: Married    Spouse name: Rocky Link   Number of children: Not on file   Years of education: Not on file   Highest education level: Not on file  Occupational History   Not on file  Tobacco Use   Smoking status: Every Day    Packs/day: 1.00    Years: 45.00    Additional pack years: 0.00    Total pack years: 45.00    Types: Cigarettes   Smokeless tobacco: Never  Vaping Use   Vaping Use: Never used  Substance and Sexual Activity   Alcohol use: Yes    Alcohol/week: 6.0 standard  drinks of alcohol    Types: 4 Glasses of wine, 2 Standard drinks or equivalent per week   Drug use: No   Sexual activity: Yes  Other Topics Concern   Not on file  Social History Narrative   Not on file   Social Determinants of Health   Financial Resource Strain: Not on file  Food Insecurity: Not on file  Transportation Needs: Not on file  Physical Activity: Not on file  Stress: Not on file  Social Connections: Not on file  Intimate Partner Violence: Not on file    FAMILY HISTORY: Family History  Problem Relation Age of Onset   COPD Mother    Pulmonary fibrosis Mother    Heart attack Father    Heart disease Father    Pancreatic cancer Maternal Uncle 75   Breast cancer Maternal Grandmother 60       twice, bilat breast--88   Breast cancer Other    Breast cancer Other    Ovarian cancer Other     ALLERGIES:  is allergic to covid-19 (mrna) vaccine, amoxicillin, and prednisone.  MEDICATIONS:  Current Outpatient Medications  Medication Sig  Dispense Refill   acetaminophen (TYLENOL) 500 MG tablet Take 1,000 mg by mouth every 6 (six) hours as needed for moderate pain.     alum & mag hydroxide-simeth (MAALOX/MYLANTA) 200-200-20 MG/5ML suspension Take 30 mLs by mouth every 6 (six) hours as needed for indigestion or heartburn.     Cholecalciferol (VITAMIN D3) 125 MCG (5000 UT) capsule Take 5,000 Units by mouth daily.     Ciclopirox 1 % shampoo Apply 1 Application topically daily as needed (eczema on scalp).     cyanocobalamin (VITAMIN B12) 1000 MCG tablet Take 1,000 mcg by mouth daily.     diphenhydrAMINE (BENADRYL) 25 MG tablet Take 25 mg by mouth daily as needed for itching.     doxycycline (PERIOSTAT) 20 MG tablet Take 20-40 mg by mouth See admin instructions. Take 20 mg daily, increase to 40 mg as needed for rosacea flare     fluocinolone (SYNALAR) 0.01 % external solution Apply 1 Application topically at bedtime as needed (eczema).     loratadine (CLARITIN) 10 MG tablet Take 10  mg by mouth daily.     Magnesium Cl-Calcium Carbonate (SLOW MAGNESIUM/CALCIUM PO) Take 1 tablet by mouth daily.     montelukast (SINGULAIR) 10 MG tablet TAKE 1 TABLET BY MOUTH EVERYDAY AT BEDTIME 90 tablet 3   Multiple Vitamin (MULTIVITAMIN) capsule Take 1 capsule by mouth daily.     pseudoephedrine (SUDAFED) 30 MG tablet Take 30 mg by mouth every 4 (four) hours as needed for congestion.     triamcinolone (NASACORT) 55 MCG/ACT AERO nasal inhaler Place 2 sprays into the nose daily.     ezetimibe (ZETIA) 10 MG tablet Take 1 tablet (10 mg total) by mouth daily. (Patient not taking: Reported on 08/28/2022) 90 tablet 3   varenicline (CHANTIX CONTINUING MONTH PAK) 1 MG tablet Take 1 tablet (1 mg total) by mouth 2 (two) times daily. (Patient not taking: Reported on 08/28/2022) 60 tablet 3   No current facility-administered medications for this visit.      PHYSICAL EXAMINATION:   Vitals:   08/28/22 0909  BP: 124/75  Pulse: 78  Resp: 18  Temp: 98.7 F (37.1 C)  SpO2: 99%   Filed Weights   08/28/22 0909  Weight: 191 lb 9.6 oz (86.9 kg)    Physical Exam Vitals and nursing note reviewed.  HENT:     Head: Normocephalic and atraumatic.     Mouth/Throat:     Pharynx: Oropharynx is clear.  Eyes:     Extraocular Movements: Extraocular movements intact.     Pupils: Pupils are equal, round, and reactive to light.  Cardiovascular:     Rate and Rhythm: Normal rate and regular rhythm.  Pulmonary:     Comments: Decreased breath sounds bilaterally.  Abdominal:     Palpations: Abdomen is soft.  Musculoskeletal:        General: Normal range of motion.     Cervical back: Normal range of motion.  Skin:    General: Skin is warm.  Neurological:     General: No focal deficit present.     Mental Status: She is alert and oriented to person, place, and time.  Psychiatric:        Behavior: Behavior normal.        Judgment: Judgment normal.      LABORATORY DATA:  I have reviewed the data as  listed Lab Results  Component Value Date   WBC 14.6 (H) 08/21/2022   HGB 14.4 08/21/2022   HCT 44.1  08/21/2022   MCV 99.8 08/21/2022   PLT 311 08/21/2022   Recent Labs    04/20/22 0953 08/21/22 0604  NA 144 138  K 4.4 4.5  CL 102 104  CO2 26 21*  GLUCOSE 100* 110*  BUN 9 11  CREATININE 0.66 0.74  CALCIUM 9.8 9.1  GFRNONAA  --  >60  PROT 6.0  --   ALBUMIN 4.1  --   AST 12  --   ALT 11  --   ALKPHOS 115  --   BILITOT 0.3  --     RADIOGRAPHIC STUDIES: I have personally reviewed the radiological images as listed and agreed with the findings in the report. DG Chest Port 1 View  Result Date: 08/21/2022 CLINICAL DATA:  Status post bronchoscopy EXAM: PORTABLE CHEST 1 VIEW COMPARISON:  06/23/2014 FINDINGS: The heart size and mediastinal contours are within normal limits. Both lungs are clear. The visualized skeletal structures are unremarkable. IMPRESSION: No acute abnormality of the lungs in AP portable projection. Electronically Signed   By: Jearld Lesch M.D.   On: 08/21/2022 09:13   DG C-ARM BRONCHOSCOPY  Result Date: 08/21/2022 C-ARM BRONCHOSCOPY: Fluoroscopy was utilized by the requesting physician.  No radiographic interpretation.   CT Super D Chest Wo Contrast  Result Date: 08/18/2022 CLINICAL DATA:  Right lower lobe pulmonary nodule, negative on PET, scheduled for biopsy EXAM: CT CHEST WITHOUT CONTRAST TECHNIQUE: Multidetector CT imaging of the chest was performed using thin slice collimation for electromagnetic bronchoscopy planning purposes, without intravenous contrast. RADIATION DOSE REDUCTION: This exam was performed according to the departmental dose-optimization program which includes automated exposure control, adjustment of the mA and/or kV according to patient size and/or use of iterative reconstruction technique. COMPARISON:  PET-CT dated 06/30/2022.  CT chest dated 06/17/2022. FINDINGS: Cardiovascular: The heart is normal in size. No pericardial effusion. No  evidence of thoracic aortic aneurysm. Atherosclerotic calcifications of the aortic arch. Mild coronary atherosclerosis of the LAD and left circumflex. Mediastinum/Nodes: No suspicious mediastinal lymphadenopathy. Visualized thyroid is unremarkable. Lungs/Pleura: Moderate centrilobular and paraseptal emphysematous changes, upper lung predominant. Mixed density cystic/solid nodule in the posterior right lower lobe measuring 15 x 11 mm, previously 14 x 10 mm on PET, stable versus minimally increased. This appearance raises concern for minimally invasive adenocarcinoma. No focal consolidation. No pleural effusion or pneumothorax. Upper Abdomen: Visualized upper abdomen is notable for cholelithiasis, stable bilateral adrenal adenomas measuring up to 2.0 cm, and mild atherosclerotic calcifications. Musculoskeletal: Visualized osseous structures are within normal limits. IMPRESSION: 15 x 11 mm mixed density cystic/solid nodule in the posterior right lower lobe, stable versus minimally increased. This appearance raises concern for minimally invasive adenocarcinoma. Additional stable ancillary findings as above. Aortic Atherosclerosis (ICD10-I70.0) and Emphysema (ICD10-J43.9). Electronically Signed   By: Charline Bills M.D.   On: 08/18/2022 01:46    ASSESSMENT & PLAN:   Primary cancer of right lower lobe of lung # Adenocarcinoma right lower lobe-clinical stage I [s/p Bronch- Dr.Dgyali].  February 2024-PET scan mildly avid right lower lobe lung nodule [48mm]-no mediastinal or hilar adenopathy.  #Discussed that early stage cancers are treated with surgery or SBRT [if patient is not a surgical candidate].  Patient currently awaiting thoracic surgery evaluation.  Currently awaiting PFTs.  I also discussed at length regarding the role of adjuvant immunotherapy/chemotherapy-radiation only if high risk stage I or II and above.  Await final surgical pathology for final recommendations.   # Smoking: Active  smoker-patient states that she is in the process of quitting  smoking.  Discussed with the patient regarding the ill effects of smoking- including but not limited to cardiac lung and vascular diseases and malignancies.   # right upper quadrant pain [currently resolved]- Hx of gallstones-monitor for now if recurrent could consider evaluation with surgery.  # genetics-family history of pancreatic cancer/breast cancer-consider genetic evaluation down the line.  Thank you Dr.Dgyali for allowing me to participate in the care of your pleasant patient. Please do not hesitate to contact me with questions or concerns in the interim.  Discussed with [facility].  #Incidental findings on Imaging  CT , 2024:cholelithiasis; Aortic Atherosclerosis [s/p card evaluation] I reviewed/discussed/counseled the patient.   # DISPOSITION: # no labs Follow up TBD- Dr.B    All questions were answered. The patient knows to call the clinic with any problems, questions or concerns.    Earna Coder, MD 08/28/2022 9:56 AM

## 2022-08-28 NOTE — Progress Notes (Signed)
Met with patient during initial visit with Dr. Donneta Romberg to discuss recent biopsy results and treatment options. All questions answered during visit. No barriers identified during visit. Contact info given and instructed to call with any questions or needs. Pt informed that will follow up with Dr. Donneta Romberg 2-3 weeks after surgery if she is a surgical candidate. Nothing further needed at this time.

## 2022-08-31 ENCOUNTER — Ambulatory Visit: Payer: Managed Care, Other (non HMO) | Admitting: Student in an Organized Health Care Education/Training Program

## 2022-09-01 ENCOUNTER — Ambulatory Visit: Payer: Managed Care, Other (non HMO) | Attending: Student in an Organized Health Care Education/Training Program

## 2022-09-01 DIAGNOSIS — R911 Solitary pulmonary nodule: Secondary | ICD-10-CM | POA: Diagnosis not present

## 2022-09-01 DIAGNOSIS — J439 Emphysema, unspecified: Secondary | ICD-10-CM | POA: Insufficient documentation

## 2022-09-01 LAB — PULMONARY FUNCTION TEST ARMC ONLY
DL/VA % pred: 81 %
DL/VA: 3.28 ml/min/mmHg/L
DLCO unc % pred: 68 %
DLCO unc: 16.71 ml/min/mmHg
FEF 25-75 Post: 2.06 L/sec
FEF 25-75 Pre: 2.69 L/sec
FEF2575-%Change-Post: -23 %
FEF2575-%Pred-Post: 77 %
FEF2575-%Pred-Pre: 100 %
FEV1-%Change-Post: -2 %
FEV1-%Pred-Post: 78 %
FEV1-%Pred-Pre: 80 %
FEV1-Post: 2.45 L
FEV1-Pre: 2.52 L
FEV1FVC-%Change-Post: -10 %
FEV1FVC-%Pred-Pre: 110 %
FEV6-%Change-Post: 8 %
FEV6-%Pred-Post: 80 %
FEV6-%Pred-Pre: 74 %
FEV6-Post: 3.16 L
FEV6-Pre: 2.91 L
FEV6FVC-%Pred-Post: 103 %
FEV6FVC-%Pred-Pre: 103 %
FVC-%Change-Post: 8 %
FVC-%Pred-Post: 78 %
FVC-%Pred-Pre: 72 %
FVC-Post: 3.16 L
FVC-Pre: 2.92 L
Post FEV1/FVC ratio: 77 %
Post FEV6/FVC ratio: 100 %
Pre FEV1/FVC ratio: 86 %
Pre FEV6/FVC Ratio: 100 %
RV % pred: 88 %
RV: 2.04 L
TLC % pred: 87 %
TLC: 5.23 L

## 2022-09-01 MED ORDER — ALBUTEROL SULFATE (2.5 MG/3ML) 0.083% IN NEBU
2.5000 mg | INHALATION_SOLUTION | Freq: Once | RESPIRATORY_TRACT | Status: AC
  Administered 2022-09-01: 2.5 mg via RESPIRATORY_TRACT

## 2022-09-02 ENCOUNTER — Encounter: Payer: Self-pay | Admitting: Internal Medicine

## 2022-09-02 ENCOUNTER — Ambulatory Visit (INDEPENDENT_AMBULATORY_CARE_PROVIDER_SITE_OTHER): Payer: Managed Care, Other (non HMO) | Admitting: Internal Medicine

## 2022-09-02 VITALS — BP 122/76 | HR 84 | Ht 70.0 in | Wt 189.0 lb

## 2022-09-02 DIAGNOSIS — F17200 Nicotine dependence, unspecified, uncomplicated: Secondary | ICD-10-CM

## 2022-09-02 DIAGNOSIS — H6501 Acute serous otitis media, right ear: Secondary | ICD-10-CM | POA: Diagnosis not present

## 2022-09-02 MED ORDER — AZITHROMYCIN 250 MG PO TABS
ORAL_TABLET | ORAL | 0 refills | Status: AC
Start: 2022-09-02 — End: 2022-09-07

## 2022-09-02 NOTE — Progress Notes (Signed)
Date:  09/02/2022   Name:  Sharon [name]   DOB:  1961-02-09   MRN:  [id number]   Chief Complaint: Tinnitus (Started 4/13- got better but now its worse. Feels like ears are clogged up. Taking allergy meds, and benadryl. Sometimes has pressure in ears.)  Ear Fullness  There is pain in the right ear. This is a new problem. The current episode started 1 to 4 weeks ago. The problem occurs constantly. The problem has been unchanged. There has been no fever. Associated symptoms include ear discharge (some bleeding noted the day after her bronchoscopy) and hearing loss. Pertinent negatives include no coughing or sore throat.   Recently diagnosed with adenocarcinoma of lung on bronchoscopy.  Seen by oncology and has CVTS consult this week with Dr. Cliffton Asters.  Lab Results  Component Value Date   NA 138 08/21/2022   K 4.5 08/21/2022   CO2 21 (L) 08/21/2022   GLUCOSE 110 (H) 08/21/2022   BUN 11 08/21/2022   CREATININE 0.74 08/21/2022   CALCIUM 9.1 08/21/2022   EGFR 100 04/20/2022   GFRNONAA >60 08/21/2022   Lab Results  Component Value Date   CHOL 219 (H) 04/20/2022   HDL 63 04/20/2022   LDLCALC 132 (H) 04/20/2022   TRIG 136 04/20/2022   CHOLHDL 3.5 04/20/2022   Lab Results  Component Value Date   TSH 0.937 04/20/2022   Lab Results  Component Value Date   HGBA1C 5.7 (H) 04/20/2022   Lab Results  Component Value Date   WBC 14.6 (H) 08/21/2022   HGB 14.4 08/21/2022   HCT 44.1 08/21/2022   MCV 99.8 08/21/2022   PLT 311 08/21/2022   Lab Results  Component Value Date   ALT 11 04/20/2022   AST 12 04/20/2022   ALKPHOS 115 04/20/2022   BILITOT 0.3 04/20/2022   Lab Results  Component Value Date   VD25OH 45.5 03/14/2019     Review of Systems  Constitutional:  Negative for chills, fatigue and fever.  HENT:  Positive for congestion, ear discharge (some bleeding noted the day after her bronchoscopy), hearing loss, sinus pressure and tinnitus. Negative for sore throat.    Respiratory:  Negative for cough, chest tightness and shortness of breath.   Cardiovascular:  Negative for chest pain and palpitations.  Psychiatric/Behavioral:  Negative for dysphoric mood and sleep disturbance. The patient is not nervous/anxious.     Patient Active Problem List   Diagnosis Date Noted   Primary cancer of right lower lobe of lung 08/28/2022   Coronary artery calcification 07/19/2022   RBBB (right bundle branch block) 08/25/2021   Aortic atherosclerosis 06/13/2021   Multinodular goiter (nontoxic) 04/08/2020   Personal history of colonic polyps    Mixed hyperlipidemia 03/11/2018   Pulmonary nodule 03/11/2018   Family history of breast cancer    Vitamin D deficiency 09/28/2016   Increased risk of breast cancer 09/08/2016   Environmental and seasonal allergies 05/22/2016   Muscle spasms of neck 03/11/2015   Acne erythematosa 03/11/2015   Seborrhea capitis 03/11/2015   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   Compulsive tobacco user syndrome 03/11/2015    Allergies  Allergen Reactions   Covid-19 (Mrna) Vaccine Swelling   Amoxicillin Itching   Prednisone Other (See Comments)    "Makes me crazy"    Past Surgical History:  Procedure Laterality Date   BRONCHIAL BIOPSY  08/21/2022   Procedure: BRONCHIAL BIOPSIES;  Surgeon: Raechel Chute, MD;  Location: [facility];  Service: Pulmonary;;   BRONCHIAL BRUSHINGS  08/21/2022   Procedure: BRONCHIAL BRUSHINGS;  Surgeon: Raechel Chute, MD;  Location: [facility] ENDOSCOPY;  Service: Pulmonary;;   BRONCHIAL NEEDLE ASPIRATION BIOPSY  08/21/2022   Procedure: BRONCHIAL NEEDLE ASPIRATION BIOPSIES;  Surgeon: Raechel Chute, MD;  Location: [facility];  Service: Pulmonary;;   BRONCHIAL WASHINGS  08/21/2022   Procedure: BRONCHIAL WASHINGS;  Surgeon: Raechel Chute, MD;  Location: [facility];  Service: Pulmonary;;   BUNIONECTOMY Bilateral    CESAREAN SECTION  1987   COLONOSCOPY WITH PROPOFOL N/A 03/12/2016   Procedure: COLONOSCOPY WITH PROPOFOL;   Surgeon: Midge Minium, MD;  Location: [facility];  Service: Endoscopy;  Laterality: N/A;   COLONOSCOPY WITH PROPOFOL N/A 06/05/2019   Procedure: COLONOSCOPY WITH BIOPSIES;  Surgeon: Midge Minium, MD;  Location: [facility];  Service: Endoscopy;  Laterality: N/A;   LUMBAR DISC SURGERY     POLYPECTOMY  03/12/2016   Procedure: POLYPECTOMY;  Surgeon: Midge Minium, MD;  Location: [facility];  Service: Endoscopy;;   POLYPECTOMY N/A 06/05/2019   Procedure: POLYPECTOMY;  Surgeon: Midge Minium, MD;  Location: [facility];  Service: Endoscopy;  Laterality: N/A;   VIDEO BRONCHOSCOPY WITH ENDOBRONCHIAL ULTRASOUND Right 08/21/2022   Procedure: VIDEO BRONCHOSCOPY WITH ENDOBRONCHIAL ULTRASOUND;  Surgeon: Raechel Chute, MD;  Location: [facility];  Service: Pulmonary;  Laterality: Right;    Social History   Tobacco Use   Smoking status: Every Day    Packs/day: 1.00    Years: 45.00    Additional pack years: 0.00    Total pack years: 45.00    Types: Cigarettes   Smokeless tobacco: Never  Vaping Use   Vaping Use: Never used  Substance Use Topics   Alcohol use: Yes    Alcohol/week: 6.0 standard drinks of alcohol    Types: 4 Glasses of wine, 2 Standard drinks or equivalent per week   Drug use: No     Medication list has been reviewed and updated.  Current Meds  Medication Sig   acetaminophen (TYLENOL) 500 MG tablet Take 1,000 mg by mouth every 6 (six) hours as needed for moderate pain.   alum & mag hydroxide-simeth (MAALOX/MYLANTA) 200-200-20 MG/5ML suspension Take 30 mLs by mouth every 6 (six) hours as needed for indigestion or heartburn.   azithromycin (ZITHROMAX Z-PAK) 250 MG tablet UAD   Cholecalciferol (VITAMIN D3) 125 MCG (5000 UT) capsule Take 5,000 Units by mouth daily.   Ciclopirox 1 % shampoo Apply 1 Application topically daily as needed (eczema on scalp).   cyanocobalamin (VITAMIN B12) 1000 MCG tablet Take 1,000 mcg by mouth daily.   diphenhydrAMINE  (BENADRYL) 25 MG tablet Take 25 mg by mouth daily as needed for itching.   doxycycline (PERIOSTAT) 20 MG tablet Take 20-40 mg by mouth See admin instructions. Take 20 mg daily, increase to 40 mg as needed for rosacea flare   ezetimibe (ZETIA) 10 MG tablet Take 1 tablet (10 mg total) by mouth daily.   fluocinolone (SYNALAR) 0.01 % external solution Apply 1 Application topically at bedtime as needed (eczema).   loratadine (CLARITIN) 10 MG tablet Take 10 mg by mouth daily.   Magnesium Cl-Calcium Carbonate (SLOW MAGNESIUM/CALCIUM PO) Take 1 tablet by mouth daily.   montelukast (SINGULAIR) 10 MG tablet TAKE 1 TABLET BY MOUTH EVERYDAY AT BEDTIME   Multiple Vitamin (MULTIVITAMIN) capsule Take 1 capsule by mouth daily.   pseudoephedrine (SUDAFED) 30 MG tablet Take 30 mg by mouth every 4 (four) hours as needed for congestion.   triamcinolone (NASACORT) 55 MCG/ACT AERO nasal inhaler  Place 2 sprays into the nose daily.   varenicline (CHANTIX CONTINUING MONTH PAK) 1 MG tablet Take 1 tablet (1 mg total) by mouth 2 (two) times daily.       09/02/2022    9:01 AM 04/20/2022    8:44 AM 08/25/2021    1:23 PM 04/15/2021    8:49 AM  GAD 7 : Generalized Anxiety Score  Nervous, Anxious, on Edge 1 0 0 0  Control/stop worrying 0 0 0 0  Worry too much - different things 1 0 0 0  Trouble relaxing 1 0 0 0  Restless 0 0 0 0  Easily annoyed or irritable 1 0 0 0  Afraid - awful might happen 0 0 0 0  Total GAD 7 Score 4 0 0 0  Anxiety Difficulty Somewhat difficult Not difficult at all  Not difficult at all       09/02/2022    9:01 AM 04/20/2022    8:44 AM 08/25/2021    1:22 PM  Depression screen PHQ 2/9  Decreased Interest 0 0 0  Down, Depressed, Hopeless 0 0 0  PHQ - 2 Score 0 0 0  Altered sleeping 0 0 0  Tired, decreased energy 0 0 0  Change in appetite 0 0 0  Feeling bad or failure about yourself  0 0 0  Trouble concentrating 0 0 0  Moving slowly or fidgety/restless 0 0 0  Suicidal thoughts 0 0 0   PHQ-9 Score 0 0 0  Difficult doing work/chores Not difficult at all Not difficult at all Not difficult at all    BP Readings from Last 3 Encounters:  09/02/22 122/76  08/28/22 124/75  08/21/22 (!) 146/74    Physical Exam HENT:     Head: Normocephalic.     Right Ear: Decreased hearing noted. A middle ear effusion is present.     Left Ear: Hearing, tympanic membrane and ear canal normal.     Ears:     Comments: Old blood in right ear canal    Nose:     Right Sinus: Maxillary sinus tenderness present. No frontal sinus tenderness.     Left Sinus: Maxillary sinus tenderness present. No frontal sinus tenderness.     Mouth/Throat:     Pharynx: Oropharynx is clear.     Wt Readings from Last 3 Encounters:  09/02/22 189 lb (85.7 kg)  08/28/22 191 lb 9.6 oz (86.9 kg)  08/21/22 190 lb (86.2 kg)    BP 122/76   Pulse 84   Ht 5\' 10"  (1.778 m)   Wt 189 lb (85.7 kg)   SpO2 95%   BMI 27.12 kg/m   Assessment and Plan:  Problem List Items Addressed This Visit       Other   Compulsive tobacco user syndrome (Chronic)    Dx'd with adenocarcinoma of lung  Cutting back - now down to 6 cigs per day Worried about Chantix making her "crazy'.  She is urged to try it and advised she can discontinue abruptly if needed      Other Visit Diagnoses     Non-recurrent acute serous otitis media of right ear    -  Primary   use H2O2 for 5 days to soften wax and loosen dried blood continue singulair, nasacort and claritin   Relevant Medications   azithromycin (ZITHROMAX Z-PAK) 250 MG tablet       No follow-ups on file.   Partially dictated using Dragon software, any errors are not intentional.  Glean Hess, MD [place], [place]

## 2022-09-02 NOTE — Patient Instructions (Signed)
Sudafed 30 mg three times a day  Continue Montelukast, Nasacort, loratidine  One drop of hydrogen peroxide in your right ear daily for 5 days

## 2022-09-02 NOTE — Assessment & Plan Note (Signed)
Dx'd with adenocarcinoma of lung  Cutting back - now down to 6 cigs per day Worried about Chantix making her "crazy'.  She is urged to try it and advised she can discontinue abruptly if needed

## 2022-09-03 ENCOUNTER — Other Ambulatory Visit: Payer: Self-pay

## 2022-09-03 NOTE — Progress Notes (Signed)
[address]       [place] [address]             [phone]                    Sharon [name] [facility] Medical Record #[id number] Date of Birth: May 02, 1961  Referring: Raechel Chute, MD Primary Care: Reubin Milan, MD Primary Cardiologist: None  Chief Complaint:    Chief Complaint  Patient presents with   Lung Lesion    New patient consult, Bronch 4/12, Chest CT 4/8, PET 2/20, PFTs 4/23    History of Present Illness:    Sharon [name] 62 y.o. female presents for surgical evaluation of a biopsy-proven non-small cell lung cancer of the right lower lobe.  She is a current smoker, and is down to 5 cigarettes/day.  She is willing to quit as of today.  She denies any shortness of breath or chest pain.    Zubrod Score: At the time of surgery this patient's most appropriate activity status/level should be described as: [x]     0    Normal activity, no symptoms []     1    Restricted in physical strenuous activity but ambulatory, able to do out light work []     2    Ambulatory and capable of self care, unable to do work activities, up and about               >50 % of waking hours                              []     3    Only limited self care, in bed greater than 50% of waking hours []     4    Completely disabled, no self care, confined to bed or chair []     5    Moribund   Past Medical History:  Diagnosis Date   Benign neoplasm of ascending colon    BRCA negative 09/2016   My Risk neg   COPD (chronic obstructive pulmonary disease) (HCC)    emphysema   Disease of nail 03/11/2015   Encounter for nonprocreative genetic counseling    Family history of breast cancer    and pancreatic   Floppy mitral valve    told this "long ago".  No issues   Headache    sinus, seasonal   Increased risk of breast cancer 09/2016   IBIS=17.0%/riskscore=22.4%   Numbness in feet    s/p foot and back surgery   Polyp of sigmoid colon    Polyp of transverse colon     PONV (postoperative nausea and vomiting)    Rosacea    Special screening for malignant neoplasms, colon    Tendinitis of wrist 03/11/2015   Tinnitus 12/2020    Past Surgical History:  Procedure Laterality Date   BRONCHIAL BIOPSY  08/21/2022   Procedure: BRONCHIAL BIOPSIES;  Surgeon: Raechel Chute, MD;  Location: [facility];  Service: Pulmonary;;   BRONCHIAL BRUSHINGS  08/21/2022   Procedure: BRONCHIAL BRUSHINGS;  Surgeon: Raechel Chute, MD;  Location: [facility] ENDOSCOPY;  Service: Pulmonary;;   BRONCHIAL NEEDLE ASPIRATION BIOPSY  08/21/2022   Procedure: BRONCHIAL NEEDLE ASPIRATION BIOPSIES;  Surgeon: Raechel Chute, MD;  Location: [facility];  Service: Pulmonary;;   BRONCHIAL WASHINGS  08/21/2022   Procedure: BRONCHIAL WASHINGS;  Surgeon: Raechel Chute, MD;  Location: [facility];  Service: Pulmonary;;   BUNIONECTOMY Bilateral    CESAREAN SECTION  1987   COLONOSCOPY WITH PROPOFOL N/A 03/12/2016   Procedure: COLONOSCOPY WITH PROPOFOL;  Surgeon: Midge Minium, MD;  Location: [facility];  Service: Endoscopy;  Laterality: N/A;   COLONOSCOPY WITH PROPOFOL N/A 06/05/2019   Procedure: COLONOSCOPY WITH BIOPSIES;  Surgeon: Midge Minium, MD;  Location: [facility];  Service: Endoscopy;  Laterality: N/A;   LUMBAR DISC SURGERY     POLYPECTOMY  03/12/2016   Procedure: POLYPECTOMY;  Surgeon: Midge Minium, MD;  Location: [facility];  Service: Endoscopy;;   POLYPECTOMY N/A 06/05/2019   Procedure: POLYPECTOMY;  Surgeon: Midge Minium, MD;  Location: [facility];  Service: Endoscopy;  Laterality: N/A;   VIDEO BRONCHOSCOPY WITH ENDOBRONCHIAL ULTRASOUND Right 08/21/2022   Procedure: VIDEO BRONCHOSCOPY WITH ENDOBRONCHIAL ULTRASOUND;  Surgeon: Raechel Chute, MD;  Location: [facility];  Service: Pulmonary;  Laterality: Right;    Family History  Problem Relation Age of Onset   COPD Mother    Pulmonary fibrosis Mother    Heart attack Father    Heart disease Father     Pancreatic cancer Maternal Uncle 89   Breast cancer Maternal Grandmother 60       twice, bilat breast--88   Breast cancer Other    Breast cancer Other    Ovarian cancer Other      Social History   Tobacco Use  Smoking Status Every Day   Packs/day: 1.00   Years: 45.00   Additional pack years: 0.00   Total pack years: 45.00   Types: Cigarettes  Smokeless Tobacco Never    Social History   Substance and Sexual Activity  Alcohol Use Yes   Alcohol/week: 6.0 standard drinks of alcohol   Types: 4 Glasses of wine, 2 Standard drinks or equivalent per week     Allergies  Allergen Reactions   Covid-19 (Mrna) Vaccine Swelling   Amoxicillin Itching   Prednisone Other (See Comments)    "Makes me crazy"    Current Outpatient Medications  Medication Sig Dispense Refill   acetaminophen (TYLENOL) 500 MG tablet Take 1,000 mg by mouth every 6 (six) hours as needed for moderate pain.     alum & mag hydroxide-simeth (MAALOX/MYLANTA) 200-200-20 MG/5ML suspension Take 30 mLs by mouth every 6 (six) hours as needed for indigestion or heartburn.     azithromycin (ZITHROMAX Z-PAK) 250 MG tablet UAD 6 each 0   Cholecalciferol (VITAMIN D3) 125 MCG (5000 UT) capsule Take 5,000 Units by mouth daily.     Ciclopirox 1 % shampoo Apply 1 Application topically daily as needed (eczema on scalp).     cyanocobalamin (VITAMIN B12) 1000 MCG tablet Take 1,000 mcg by mouth daily.     diphenhydrAMINE (BENADRYL) 25 MG tablet Take 25 mg by mouth daily as needed for itching.     doxycycline (PERIOSTAT) 20 MG tablet Take 20-40 mg by mouth See admin instructions. Take 20 mg daily, increase to 40 mg as needed for rosacea flare     ezetimibe (ZETIA) 10 MG tablet Take 1 tablet (10 mg total) by mouth daily. 90 tablet 3   fluocinolone (SYNALAR) 0.01 % external solution Apply 1 Application topically at bedtime as needed (eczema).     loratadine (CLARITIN) 10 MG tablet Take 10 mg by mouth daily.     Magnesium Cl-Calcium  Carbonate (SLOW MAGNESIUM/CALCIUM PO) Take 1 tablet by mouth daily.     montelukast (SINGULAIR) 10 MG tablet TAKE 1 TABLET BY MOUTH EVERYDAY  AT BEDTIME 90 tablet 3   Multiple Vitamin (MULTIVITAMIN) capsule Take 1 capsule by mouth daily.     pseudoephedrine (SUDAFED) 30 MG tablet Take 30 mg by mouth every 4 (four) hours as needed for congestion.     triamcinolone (NASACORT) 55 MCG/ACT AERO nasal inhaler Place 2 sprays into the nose daily.     varenicline (CHANTIX CONTINUING MONTH PAK) 1 MG tablet Take 1 tablet (1 mg total) by mouth 2 (two) times daily. 60 tablet 3   No current facility-administered medications for this visit.    Review of Systems  Constitutional:  Negative for malaise/fatigue and weight loss.  Respiratory:  Positive for cough. Negative for shortness of breath.   Cardiovascular:  Negative for chest pain.  Neurological: Negative.      PHYSICAL EXAMINATION: BP 126/74 (BP Location: Left Arm, Patient Position: Sitting, Cuff Size: Normal)   Pulse 87   Resp 20   Ht 5\' 10"  (1.778 m)   Wt 190 lb (86.2 kg)   SpO2 96% Comment: RA  BMI 27.26 kg/m  Physical Exam Constitutional:      General: She is not in acute distress.    Appearance: She is not ill-appearing.  HENT:     Head: Normocephalic and atraumatic.  Eyes:     Extraocular Movements: Extraocular movements intact.  Cardiovascular:     Rate and Rhythm: Normal rate.  Abdominal:     General: Abdomen is flat. There is no distension.  Musculoskeletal:        General: Normal range of motion.     Cervical back: Normal range of motion.  Skin:    General: Skin is warm and dry.  Neurological:     General: No focal deficit present.     Mental Status: She is alert and oriented to person, place, and time.     Diagnostic Studies & Laboratory data:     Recent Radiology Findings:   DG Chest Port 1 View  Result Date: 08/21/2022 CLINICAL DATA:  Status post bronchoscopy EXAM: PORTABLE CHEST 1 VIEW COMPARISON:  06/23/2014  FINDINGS: The heart size and mediastinal contours are within normal limits. Both lungs are clear. The visualized skeletal structures are unremarkable. IMPRESSION: No acute abnormality of the lungs in AP portable projection. Electronically Signed   By: Jearld Lesch M.D.   On: 08/21/2022 09:13   DG C-ARM BRONCHOSCOPY  Result Date: 08/21/2022 C-ARM BRONCHOSCOPY: Fluoroscopy was utilized by the requesting physician.  No radiographic interpretation.   CT Super D Chest Wo Contrast  Result Date: 08/18/2022 CLINICAL DATA:  Right lower lobe pulmonary nodule, negative on PET, scheduled for biopsy EXAM: CT CHEST WITHOUT CONTRAST TECHNIQUE: Multidetector CT imaging of the chest was performed using thin slice collimation for electromagnetic bronchoscopy planning purposes, without intravenous contrast. RADIATION DOSE REDUCTION: This exam was performed according to the departmental dose-optimization program which includes automated exposure control, adjustment of the mA and/or kV according to patient size and/or use of iterative reconstruction technique. COMPARISON:  PET-CT dated 06/30/2022.  CT chest dated 06/17/2022. FINDINGS: Cardiovascular: The heart is normal in size. No pericardial effusion. No evidence of thoracic aortic aneurysm. Atherosclerotic calcifications of the aortic arch. Mild coronary atherosclerosis of the LAD and left circumflex. Mediastinum/Nodes: No suspicious mediastinal lymphadenopathy. Visualized thyroid is unremarkable. Lungs/Pleura: Moderate centrilobular and paraseptal emphysematous changes, upper lung predominant. Mixed density cystic/solid nodule in the posterior right lower lobe measuring 15 x 11 mm, previously 14 x 10 mm on PET, stable versus minimally increased. This appearance raises concern  for minimally invasive adenocarcinoma. No focal consolidation. No pleural effusion or pneumothorax. Upper Abdomen: Visualized upper abdomen is notable for cholelithiasis, stable bilateral adrenal  adenomas measuring up to 2.0 cm, and mild atherosclerotic calcifications. Musculoskeletal: Visualized osseous structures are within normal limits. IMPRESSION: 15 x 11 mm mixed density cystic/solid nodule in the posterior right lower lobe, stable versus minimally increased. This appearance raises concern for minimally invasive adenocarcinoma. Additional stable ancillary findings as above. Aortic Atherosclerosis (ICD10-I70.0) and Emphysema (ICD10-J43.9). Electronically Signed   By: Charline Bills M.D.   On: 08/18/2022 01:46       I have independently reviewed the above radiology studies  and reviewed the findings with the patient.   Recent Lab Findings: Lab Results  Component Value Date   WBC 14.6 (H) 08/21/2022   HGB 14.4 08/21/2022   HCT 44.1 08/21/2022   PLT 311 08/21/2022   GLUCOSE 110 (H) 08/21/2022   CHOL 219 (H) 04/20/2022   TRIG 136 04/20/2022   HDL 63 04/20/2022   LDLCALC 132 (H) 04/20/2022   ALT 11 04/20/2022   AST 12 04/20/2022   NA 138 08/21/2022   K 4.5 08/21/2022   CL 104 08/21/2022   CREATININE 0.74 08/21/2022   BUN 11 08/21/2022   CO2 21 (L) 08/21/2022   TSH 0.937 04/20/2022   HGBA1C 5.7 (H) 04/20/2022     PFTs:  - FVC: 72% - FEV1: 80% -DLCO: 68%     FINAL MICROSCOPIC DIAGNOSIS:   A. LUNG, RLL, FINE NEEDLE ASPIRATION:  - Adenocarcinoma  - See comment   B. LUNG, RLL, BRUSHING:  - Adenocarcinoma  - See comment   PET CT MPRESSION: 1. The posterior right lower lobe pulmonary nodule of concern on recent lung cancer screening chest CT shows no hypermetabolism on PET imaging today. While reassuring, low grade or well differentiated neoplasm can be poorly FDG avid. 2. Cholelithiasis. 3.  Aortic Atherosclerosis (ICD10-I70.0).  Assessment / Plan:   62yo female with biopsy proven RLL adenocarcinoma.  PFT are acceptable.  We discussed the risks and benefits of a right robotic assisted right lower lobectomy.  She is agreeable to proceed.     I   spent 40 minutes with  the patient face to face in counseling and coordination of care.    Corliss Skains 09/04/2022 3:14 PM

## 2022-09-03 NOTE — Progress Notes (Signed)
The proposed treatment discussed in conference is for discussion purpose only and is not a binding recommendation.  The patients have not been physically examined, or presented with their treatment options.  Therefore, final treatment plans cannot be decided.  

## 2022-09-03 NOTE — H&P (View-Only) (Signed)
    [address]       [place] [address]             [phone]                    Corah R Latouche Elliott Medical Record #[id number] Date of Birth: 08/09/1960  Referring: Dgayli, Khabib, MD Primary Care: Berglund, Laura H, MD Primary Cardiologist: None  Chief Complaint:    Chief Complaint  Patient presents with   Lung Lesion    New patient consult, Bronch 4/12, Chest CT 4/8, PET 2/20, PFTs 4/23    History of Present Illness:    Sharon [name] 62 y.o. female presents for surgical evaluation of a biopsy-proven non-small cell lung cancer of the right lower lobe.  She is a current smoker, and is down to 5 cigarettes/day.  She is willing to quit as of today.  She denies any shortness of breath or chest pain.    Zubrod Score: At the time of surgery this patient's most appropriate activity status/level should be described as: [x]    0    Normal activity, no symptoms []    1    Restricted in physical strenuous activity but ambulatory, able to do out light work []    2    Ambulatory and capable of self care, unable to do work activities, up and about               >50 % of waking hours                              []    3    Only limited self care, in bed greater than 50% of waking hours []    4    Completely disabled, no self care, confined to bed or chair []    5    Moribund   Past Medical History:  Diagnosis Date   Benign neoplasm of ascending colon    BRCA negative 09/2016   My Risk neg   COPD (chronic obstructive pulmonary disease) (HCC)    emphysema   Disease of nail 03/11/2015   Encounter for nonprocreative genetic counseling    Family history of breast cancer    and pancreatic   Floppy mitral valve    told this "long ago".  No issues   Headache    sinus, seasonal   Increased risk of breast cancer 09/2016   IBIS=17.0%/riskscore=22.4%   Numbness in feet    s/p foot and back surgery   Polyp of sigmoid colon    Polyp of transverse colon     PONV (postoperative nausea and vomiting)    Rosacea    Special screening for malignant neoplasms, colon    Tendinitis of wrist 03/11/2015   Tinnitus 12/2020    Past Surgical History:  Procedure Laterality Date   BRONCHIAL BIOPSY  08/21/2022   Procedure: BRONCHIAL BIOPSIES;  Surgeon: Dgayli, Khabib, MD;  Location: [facility];  Service: Pulmonary;;   BRONCHIAL BRUSHINGS  08/21/2022   Procedure: BRONCHIAL BRUSHINGS;  Surgeon: Dgayli, Khabib, MD;  Location: [facility];  Service: Pulmonary;;   BRONCHIAL NEEDLE ASPIRATION BIOPSY  08/21/2022   Procedure: BRONCHIAL NEEDLE ASPIRATION BIOPSIES;  Surgeon: Dgayli, Khabib, MD;  Location: [facility];  Service: Pulmonary;;   BRONCHIAL WASHINGS  08/21/2022   Procedure: BRONCHIAL WASHINGS;  Surgeon: Dgayli, Khabib, MD;  Location: [facility];    Service: Pulmonary;;   BUNIONECTOMY Bilateral    CESAREAN SECTION  1987   COLONOSCOPY WITH PROPOFOL N/A 03/12/2016   Procedure: COLONOSCOPY WITH PROPOFOL;  Surgeon: Darren Wohl, MD;  Location: [facility];  Service: Endoscopy;  Laterality: N/A;   COLONOSCOPY WITH PROPOFOL N/A 06/05/2019   Procedure: COLONOSCOPY WITH BIOPSIES;  Surgeon: Wohl, Darren, MD;  Location: [facility];  Service: Endoscopy;  Laterality: N/A;   LUMBAR DISC SURGERY     POLYPECTOMY  03/12/2016   Procedure: POLYPECTOMY;  Surgeon: Darren Wohl, MD;  Location: [facility];  Service: Endoscopy;;   POLYPECTOMY N/A 06/05/2019   Procedure: POLYPECTOMY;  Surgeon: Wohl, Darren, MD;  Location: [facility];  Service: Endoscopy;  Laterality: N/A;   VIDEO BRONCHOSCOPY WITH ENDOBRONCHIAL ULTRASOUND Right 08/21/2022   Procedure: VIDEO BRONCHOSCOPY WITH ENDOBRONCHIAL ULTRASOUND;  Surgeon: Dgayli, Khabib, MD;  Location: [facility];  Service: Pulmonary;  Laterality: Right;    Family History  Problem Relation Age of Onset   COPD Mother    Pulmonary fibrosis Mother    Heart attack Father    Heart disease Father     Pancreatic cancer Maternal Uncle 60   Breast cancer Maternal Grandmother 60       twice, bilat breast--88   Breast cancer Other    Breast cancer Other    Ovarian cancer Other      Social History   Tobacco Use  Smoking Status Every Day   Packs/day: 1.00   Years: 45.00   Additional pack years: 0.00   Total pack years: 45.00   Types: Cigarettes  Smokeless Tobacco Never    Social History   Substance and Sexual Activity  Alcohol Use Yes   Alcohol/week: 6.0 standard drinks of alcohol   Types: 4 Glasses of wine, 2 Standard drinks or equivalent per week     Allergies  Allergen Reactions   Covid-19 (Mrna) Vaccine Swelling   Amoxicillin Itching   Prednisone Other (See Comments)    "Makes me crazy"    Current Outpatient Medications  Medication Sig Dispense Refill   acetaminophen (TYLENOL) 500 MG tablet Take 1,000 mg by mouth every 6 (six) hours as needed for moderate pain.     alum & mag hydroxide-simeth (MAALOX/MYLANTA) 200-200-20 MG/5ML suspension Take 30 mLs by mouth every 6 (six) hours as needed for indigestion or heartburn.     azithromycin (ZITHROMAX Z-PAK) 250 MG tablet UAD 6 each 0   Cholecalciferol (VITAMIN D3) 125 MCG (5000 UT) capsule Take 5,000 Units by mouth daily.     Ciclopirox 1 % shampoo Apply 1 Application topically daily as needed (eczema on scalp).     cyanocobalamin (VITAMIN B12) 1000 MCG tablet Take 1,000 mcg by mouth daily.     diphenhydrAMINE (BENADRYL) 25 MG tablet Take 25 mg by mouth daily as needed for itching.     doxycycline (PERIOSTAT) 20 MG tablet Take 20-40 mg by mouth See admin instructions. Take 20 mg daily, increase to 40 mg as needed for rosacea flare     ezetimibe (ZETIA) 10 MG tablet Take 1 tablet (10 mg total) by mouth daily. 90 tablet 3   fluocinolone (SYNALAR) 0.01 % external solution Apply 1 Application topically at bedtime as needed (eczema).     loratadine (CLARITIN) 10 MG tablet Take 10 mg by mouth daily.     Magnesium Cl-Calcium  Carbonate (SLOW MAGNESIUM/CALCIUM PO) Take 1 tablet by mouth daily.     montelukast (SINGULAIR) 10 MG tablet TAKE 1 TABLET BY MOUTH EVERYDAY   AT BEDTIME 90 tablet 3   Multiple Vitamin (MULTIVITAMIN) capsule Take 1 capsule by mouth daily.     pseudoephedrine (SUDAFED) 30 MG tablet Take 30 mg by mouth every 4 (four) hours as needed for congestion.     triamcinolone (NASACORT) 55 MCG/ACT AERO nasal inhaler Place 2 sprays into the nose daily.     varenicline (CHANTIX CONTINUING MONTH PAK) 1 MG tablet Take 1 tablet (1 mg total) by mouth 2 (two) times daily. 60 tablet 3   No current facility-administered medications for this visit.    Review of Systems  Constitutional:  Negative for malaise/fatigue and weight loss.  Respiratory:  Positive for cough. Negative for shortness of breath.   Cardiovascular:  Negative for chest pain.  Neurological: Negative.      PHYSICAL EXAMINATION: BP 126/74 (BP Location: Left Arm, Patient Position: Sitting, Cuff Size: Normal)   Pulse 87   Resp 20   Ht 5' 10" (1.778 m)   Wt 190 lb (86.2 kg)   SpO2 96% Comment: RA  BMI 27.26 kg/m  Physical Exam Constitutional:      General: She is not in acute distress.    Appearance: She is not ill-appearing.  HENT:     Head: Normocephalic and atraumatic.  Eyes:     Extraocular Movements: Extraocular movements intact.  Cardiovascular:     Rate and Rhythm: Normal rate.  Abdominal:     General: Abdomen is flat. There is no distension.  Musculoskeletal:        General: Normal range of motion.     Cervical back: Normal range of motion.  Skin:    General: Skin is warm and dry.  Neurological:     General: No focal deficit present.     Mental Status: She is alert and oriented to person, place, and time.     Diagnostic Studies & Laboratory data:     Recent Radiology Findings:   DG Chest Port 1 View  Result Date: 08/21/2022 CLINICAL DATA:  Status post bronchoscopy EXAM: PORTABLE CHEST 1 VIEW COMPARISON:  06/23/2014  FINDINGS: The heart size and mediastinal contours are within normal limits. Both lungs are clear. The visualized skeletal structures are unremarkable. IMPRESSION: No acute abnormality of the lungs in AP portable projection. Electronically Signed   By: Alex D Bibbey M.D.   On: 08/21/2022 09:13   DG C-ARM BRONCHOSCOPY  Result Date: 08/21/2022 C-ARM BRONCHOSCOPY: Fluoroscopy was utilized by the requesting physician.  No radiographic interpretation.   CT Super D Chest Wo Contrast  Result Date: 08/18/2022 CLINICAL DATA:  Right lower lobe pulmonary nodule, negative on PET, scheduled for biopsy EXAM: CT CHEST WITHOUT CONTRAST TECHNIQUE: Multidetector CT imaging of the chest was performed using thin slice collimation for electromagnetic bronchoscopy planning purposes, without intravenous contrast. RADIATION DOSE REDUCTION: This exam was performed according to the departmental dose-optimization program which includes automated exposure control, adjustment of the mA and/or kV according to patient size and/or use of iterative reconstruction technique. COMPARISON:  PET-CT dated 06/30/2022.  CT chest dated 06/17/2022. FINDINGS: Cardiovascular: The heart is normal in size. No pericardial effusion. No evidence of thoracic aortic aneurysm. Atherosclerotic calcifications of the aortic arch. Mild coronary atherosclerosis of the LAD and left circumflex. Mediastinum/Nodes: No suspicious mediastinal lymphadenopathy. Visualized thyroid is unremarkable. Lungs/Pleura: Moderate centrilobular and paraseptal emphysematous changes, upper lung predominant. Mixed density cystic/solid nodule in the posterior right lower lobe measuring 15 x 11 mm, previously 14 x 10 mm on PET, stable versus minimally increased. This appearance raises concern   for minimally invasive adenocarcinoma. No focal consolidation. No pleural effusion or pneumothorax. Upper Abdomen: Visualized upper abdomen is notable for cholelithiasis, stable bilateral adrenal  adenomas measuring up to 2.0 cm, and mild atherosclerotic calcifications. Musculoskeletal: Visualized osseous structures are within normal limits. IMPRESSION: 15 x 11 mm mixed density cystic/solid nodule in the posterior right lower lobe, stable versus minimally increased. This appearance raises concern for minimally invasive adenocarcinoma. Additional stable ancillary findings as above. Aortic Atherosclerosis (ICD10-I70.0) and Emphysema (ICD10-J43.9). Electronically Signed   By: Sriyesh  Krishnan M.D.   On: 08/18/2022 01:46       I have independently reviewed the above radiology studies  and reviewed the findings with the patient.   Recent Lab Findings: Lab Results  Component Value Date   WBC 14.6 (H) 08/21/2022   HGB 14.4 08/21/2022   HCT 44.1 08/21/2022   PLT 311 08/21/2022   GLUCOSE 110 (H) 08/21/2022   CHOL 219 (H) 04/20/2022   TRIG 136 04/20/2022   HDL 63 04/20/2022   LDLCALC 132 (H) 04/20/2022   ALT 11 04/20/2022   AST 12 04/20/2022   NA 138 08/21/2022   K 4.5 08/21/2022   CL 104 08/21/2022   CREATININE 0.74 08/21/2022   BUN 11 08/21/2022   CO2 21 (L) 08/21/2022   TSH 0.937 04/20/2022   HGBA1C 5.7 (H) 04/20/2022     PFTs:  - FVC: 72% - FEV1: 80% -DLCO: 68%     FINAL MICROSCOPIC DIAGNOSIS:   A. LUNG, RLL, FINE NEEDLE ASPIRATION:  - Adenocarcinoma  - See comment   B. LUNG, RLL, BRUSHING:  - Adenocarcinoma  - See comment   PET CT MPRESSION: 1. The posterior right lower lobe pulmonary nodule of concern on recent lung cancer screening chest CT shows no hypermetabolism on PET imaging today. While reassuring, low grade or well differentiated neoplasm can be poorly FDG avid. 2. Cholelithiasis. 3.  Aortic Atherosclerosis (ICD10-I70.0).  Assessment / Plan:   61yo female with biopsy proven RLL adenocarcinoma.  PFT are acceptable.  We discussed the risks and benefits of a right robotic assisted right lower lobectomy.  She is agreeable to proceed.     I   spent 40 minutes with  the patient face to face in counseling and coordination of care.    Teena Mangus O Kainat Pizana 09/04/2022 3:14 PM        

## 2022-09-04 ENCOUNTER — Other Ambulatory Visit: Payer: Self-pay | Admitting: *Deleted

## 2022-09-04 ENCOUNTER — Encounter: Payer: Self-pay | Admitting: *Deleted

## 2022-09-04 ENCOUNTER — Institutional Professional Consult (permissible substitution): Payer: Managed Care, Other (non HMO) | Admitting: Thoracic Surgery (Cardiothoracic Vascular Surgery)

## 2022-09-04 ENCOUNTER — Encounter: Payer: Self-pay | Admitting: Thoracic Surgery (Cardiothoracic Vascular Surgery)

## 2022-09-04 ENCOUNTER — Other Ambulatory Visit: Payer: Self-pay | Admitting: Thoracic Surgery (Cardiothoracic Vascular Surgery)

## 2022-09-04 VITALS — BP 126/74 | HR 87 | Resp 20 | Ht 70.0 in | Wt 190.0 lb

## 2022-09-04 DIAGNOSIS — C3431 Malignant neoplasm of lower lobe, right bronchus or lung: Secondary | ICD-10-CM

## 2022-09-07 ENCOUNTER — Other Ambulatory Visit: Payer: Self-pay | Admitting: Thoracic Surgery (Cardiothoracic Vascular Surgery)

## 2022-09-07 DIAGNOSIS — C3431 Malignant neoplasm of lower lobe, right bronchus or lung: Secondary | ICD-10-CM

## 2022-09-09 ENCOUNTER — Other Ambulatory Visit: Payer: Self-pay | Admitting: Thoracic Surgery (Cardiothoracic Vascular Surgery)

## 2022-09-09 ENCOUNTER — Inpatient Hospital Stay: Payer: Managed Care, Other (non HMO) | Attending: Internal Medicine

## 2022-09-09 DIAGNOSIS — C3431 Malignant neoplasm of lower lobe, right bronchus or lung: Secondary | ICD-10-CM

## 2022-09-09 DIAGNOSIS — Z79899 Other long term (current) drug therapy: Secondary | ICD-10-CM | POA: Insufficient documentation

## 2022-09-09 DIAGNOSIS — Z902 Acquired absence of lung [part of]: Secondary | ICD-10-CM | POA: Insufficient documentation

## 2022-09-09 DIAGNOSIS — Z8041 Family history of malignant neoplasm of ovary: Secondary | ICD-10-CM | POA: Insufficient documentation

## 2022-09-09 DIAGNOSIS — Z803 Family history of malignant neoplasm of breast: Secondary | ICD-10-CM | POA: Insufficient documentation

## 2022-09-09 DIAGNOSIS — Z87891 Personal history of nicotine dependence: Secondary | ICD-10-CM | POA: Insufficient documentation

## 2022-09-09 DIAGNOSIS — Z800 Family history of malignant neoplasm of digestive organs: Secondary | ICD-10-CM | POA: Insufficient documentation

## 2022-09-09 NOTE — Progress Notes (Signed)
Multidisciplinary Oncology Council Documentation  Sharon [name] was presented by our [facility] on 09/09/2022, which included representatives from:  Palliative Care Dietitian  Physical/Occupational Therapist Nurse Navigator Genetics Speech Therapist Social work Survivorship RN Financial Navigator Research RN   Sharon [name] currently presents with history of lung cancer  We reviewed previous medical and familial history, history of present illness, and recent lab results along with all available histopathologic and imaging studies. The MOC considered available treatment options and made the following recommendations/referrals:  None  The MOC is a meeting of clinicians from various specialty areas who evaluate and discuss patients for whom a multidisciplinary approach is being considered. Final determinations in the plan of care are those of the provider(s).   Today's extended care, comprehensive team conference, Sharon [name] was not present for the discussion and was not examined.

## 2022-09-10 ENCOUNTER — Telehealth (HOSPITAL_COMMUNITY): Payer: Self-pay | Admitting: *Deleted

## 2022-09-10 NOTE — Telephone Encounter (Signed)
Patient given detailed instructions per Myocardial Perfusion Study Information Sheet for the test on 09/14/2022 at 10:15. Patient notified to arrive 15 minutes early and that it is imperative to arrive on time for appointment to keep from having the test rescheduled.  If you need to cancel or reschedule your appointment, please call the office within 24 hours of your appointment. . Patient verbalized understanding.[name]

## 2022-09-14 ENCOUNTER — Ambulatory Visit (HOSPITAL_COMMUNITY): Payer: Managed Care, Other (non HMO) | Attending: Thoracic Surgery (Cardiothoracic Vascular Surgery)

## 2022-09-14 DIAGNOSIS — C3431 Malignant neoplasm of lower lobe, right bronchus or lung: Secondary | ICD-10-CM

## 2022-09-14 DIAGNOSIS — Z01810 Encounter for preprocedural cardiovascular examination: Secondary | ICD-10-CM | POA: Insufficient documentation

## 2022-09-14 LAB — MYOCARDIAL PERFUSION IMAGING
LV dias vol: 68 mL (ref 46–106)
LV sys vol: 28 mL
Nuc Stress EF: 59 %
Peak HR: 95 {beats}/min
Rest HR: 64 {beats}/min
Rest Nuclear Isotope Dose: 10.7 mCi
SDS: 0
SRS: 0
SSS: 0
ST Depression (mm): 0 mm
Stress Nuclear Isotope Dose: 31.6 mCi
TID: 1.08

## 2022-09-14 MED ORDER — TECHNETIUM TC 99M TETROFOSMIN IV KIT
1015.0000 | PACK | Freq: Once | INTRAVENOUS | Status: AC | PRN
  Administered 2022-09-14: 10.7 via INTRAVENOUS

## 2022-09-14 MED ORDER — TECHNETIUM TC 99M TETROFOSMIN IV KIT
31.7000 | PACK | Freq: Once | INTRAVENOUS | Status: AC | PRN
  Administered 2022-09-14: 31.7 via INTRAVENOUS

## 2022-09-14 MED ORDER — REGADENOSON 0.4 MG/5ML IV SOLN
0.4000 mg | Freq: Once | INTRAVENOUS | Status: AC
Start: 2022-09-14 — End: 2022-09-14
  Administered 2022-09-14: 0.4 mg via INTRAVENOUS

## 2022-09-16 NOTE — Pre-Procedure Instructions (Signed)
Surgical Instructions    Your procedure is scheduled on Sep 21, 2022.  Report to [facility] Main Entrance "A" at 5:30 A.M., then check in with the Admitting office.  Call this number if you have problems the morning of surgery:  [phone]  If you have any questions prior to your surgery date call [phone]: Open Monday-Friday 8am-4pm If you experience any cold or flu symptoms such as cough, fever, chills, shortness of breath, etc. between now and your scheduled surgery, please notify us at the above number.     Remember:  Do not eat or drink after midnight the night before your surgery     Take these medicines the morning of surgery with A SIP OF WATER:  ezetimibe (ZETIA)   loratadine (CLARITIN)   triamcinolone (NASACORT) nasal inhaler    May take these medicines IF NEEDED:  acetaminophen (TYLENOL)   diphenhydrAMINE (BENADRYL)   doxycycline (PERIOSTAT)   As of today, STOP taking any Aspirin (unless otherwise instructed by your surgeon) Aleve, Naproxen, Ibuprofen, Motrin, Advil, Goody's, BC's, all herbal medications, fish oil, and all vitamins.                     Do NOT Smoke (Tobacco/Vaping) for 24 hours prior to your procedure.  If you use a CPAP at night, you may bring your mask/headgear for your overnight stay.   Contacts, glasses, piercing's, hearing aid's, dentures or partials may not be worn into surgery, please bring cases for these belongings.    For patients admitted to the hospital, discharge time will be determined by your treatment team.   Patients discharged the day of surgery will not be allowed to drive home, and someone needs to stay with them for 24 hours.  SURGICAL WAITING ROOM VISITATION Patients having surgery or a procedure may have no more than 2 support people in the waiting area - these visitors may rotate.   Children under the age of 83 must have an adult with them who is not the patient. If the patient needs to stay at the hospital during  part of their recovery, the visitor guidelines for inpatient rooms apply. Pre-op nurse will coordinate an appropriate time for 1 support person to accompany patient in pre-op.  This support person may not rotate.   Please refer to the [facility] website for the visitor guidelines for Inpatients (after your surgery is over and you are in a regular room).    Special instructions:   [facility]- Preparing For Surgery  Before surgery, you can play an important role. Because skin is not sterile, your skin needs to be as free of germs as possible. You can reduce the number of germs on your skin by washing with CHG (chlorahexidine gluconate) Soap before surgery.  CHG is an antiseptic cleaner which kills germs and bonds with the skin to continue killing germs even after washing.    Oral Hygiene is also important to reduce your risk of infection.  Remember - BRUSH YOUR TEETH THE MORNING OF SURGERY WITH YOUR REGULAR TOOTHPASTE  Please do not use if you have an allergy to CHG or antibacterial soaps. If your skin becomes reddened/irritated stop using the CHG.  Do not shave (including legs and underarms) for at least 48 hours prior to first CHG shower. It is OK to shave your face.  Please follow these instructions carefully.   Shower the NIGHT BEFORE SURGERY and the MORNING OF SURGERY  If you chose to wash your hair, wash  your hair first as usual with your normal shampoo.  After you shampoo, rinse your hair and body thoroughly to remove the shampoo.  Use CHG Soap as you would any other liquid soap. You can apply CHG directly to the skin and wash gently with a scrungie or a clean washcloth.   Apply the CHG Soap to your body ONLY FROM THE NECK DOWN.  Do not use on open wounds or open sores. Avoid contact with your eyes, ears, mouth and genitals (private parts). Wash Face and genitals (private parts)  with your normal soap.   Wash thoroughly, paying special attention to the area where your surgery will  be performed.  Thoroughly rinse your body with warm water from the neck down.  DO NOT shower/wash with your normal soap after using and rinsing off the CHG Soap.  Pat yourself dry with a CLEAN TOWEL.  Wear CLEAN PAJAMAS to bed the night before surgery  Place CLEAN SHEETS on your bed the night before your surgery  DO NOT SLEEP WITH PETS.   Day of Surgery: Take a shower with CHG soap. Do not wear jewelry or makeup Do not wear lotions, powders, perfumes/colognes, or deodorant. Do not shave 48 hours prior to surgery.  Men may shave face and neck. Do not bring valuables to the hospital.  [facility] is not responsible for any belongings or valuables. Do not wear nail polish, gel polish, artificial nails, or any other type of covering on natural nails (fingers and toes) If you have artificial nails or gel coating that need to be removed by a nail salon, please have this removed prior to surgery. Artificial nails or gel coating may interfere with anesthesia's ability to adequately monitor your vital signs.  Wear Clean/Comfortable clothing the morning of surgery Remember to brush your teeth WITH YOUR REGULAR TOOTHPASTE.   Please read over the following fact sheets that you were given.    If you received a COVID test during your pre-op visit  it is requested that you wear a mask when out in public, stay away from anyone that may not be feeling well and notify your surgeon if you develop symptoms. If you have been in contact with anyone that has tested positive in the last 10 days please notify you surgeon.

## 2022-09-17 ENCOUNTER — Encounter (HOSPITAL_COMMUNITY)
Admission: RE | Admit: 2022-09-17 | Discharge: 2022-09-17 | Disposition: A | Discharge status: Home / Self Care | Payer: Managed Care, Other (non HMO) | Source: Ambulatory Visit | Attending: Thoracic Surgery (Cardiothoracic Vascular Surgery) | Admitting: Thoracic Surgery (Cardiothoracic Vascular Surgery)

## 2022-09-17 ENCOUNTER — Other Ambulatory Visit: Payer: Self-pay

## 2022-09-17 ENCOUNTER — Encounter (HOSPITAL_COMMUNITY): Payer: Self-pay

## 2022-09-17 VITALS — BP 132/71 | HR 71 | Resp 20 | Wt 191.4 lb

## 2022-09-17 DIAGNOSIS — Z01818 Encounter for other preprocedural examination: Secondary | ICD-10-CM | POA: Diagnosis present

## 2022-09-17 DIAGNOSIS — C3431 Malignant neoplasm of lower lobe, right bronchus or lung: Secondary | ICD-10-CM | POA: Insufficient documentation

## 2022-09-17 DIAGNOSIS — Z1152 Encounter for screening for COVID-19: Secondary | ICD-10-CM | POA: Insufficient documentation

## 2022-09-17 HISTORY — DX: Emphysema, unspecified: J43.9

## 2022-09-17 LAB — URINALYSIS, ROUTINE W REFLEX MICROSCOPIC
Bacteria, UA: NONE SEEN
Bilirubin Urine: NEGATIVE
Glucose, UA: NEGATIVE mg/dL
Hgb urine dipstick: NEGATIVE
Ketones, ur: NEGATIVE mg/dL
Nitrite: NEGATIVE
Protein, ur: NEGATIVE mg/dL
Specific Gravity, Urine: 1.008 (ref 1.005–1.030)
pH: 7 (ref 5.0–8.0)

## 2022-09-17 LAB — TYPE AND SCREEN
ABO/RH(D): A POS
Antibody Screen: NEGATIVE

## 2022-09-17 LAB — SURGICAL PCR SCREEN
MRSA, PCR: NEGATIVE
Staphylococcus aureus: NEGATIVE

## 2022-09-17 LAB — CBC
HCT: 42.6 % (ref 36.0–46.0)
Hemoglobin: 14.5 g/dL (ref 12.0–15.0)
MCH: 32.7 pg (ref 26.0–34.0)
MCHC: 34 g/dL (ref 30.0–36.0)
MCV: 96.2 fL (ref 80.0–100.0)
Platelets: 274 10*3/uL (ref 150–400)
RBC: 4.43 MIL/uL (ref 3.87–5.11)
RDW: 11.9 % (ref 11.5–15.5)
WBC: 9.9 10*3/uL (ref 4.0–10.5)
nRBC: 0 % (ref 0.0–0.2)

## 2022-09-17 LAB — COMPREHENSIVE METABOLIC PANEL WITH GFR
ALT: 15 U/L (ref 0–44)
AST: 15 U/L (ref 15–41)
Albumin: 3.8 g/dL (ref 3.5–5.0)
Alkaline Phosphatase: 103 U/L (ref 38–126)
Anion gap: 6 (ref 5–15)
BUN: 10 mg/dL (ref 8–23)
CO2: 26 mmol/L (ref 22–32)
Calcium: 9.2 mg/dL (ref 8.9–10.3)
Chloride: 107 mmol/L (ref 98–111)
Creatinine, Ser: 0.72 mg/dL (ref 0.44–1.00)
GFR, Estimated: >60 mL/min
Glucose, Bld: 95 mg/dL (ref 70–99)
Potassium: 3.8 mmol/L (ref 3.5–5.1)
Sodium: 139 mmol/L (ref 135–145)
Total Bilirubin: 0.6 mg/dL (ref 0.3–1.2)
Total Protein: 6.2 g/dL — ABNORMAL LOW (ref 6.5–8.1)

## 2022-09-17 LAB — PROTIME-INR
INR: 1 (ref 0.8–1.2)
Prothrombin Time: 13.4 s (ref 11.4–15.2)

## 2022-09-17 LAB — APTT: aPTT: 26 seconds (ref 24–36)

## 2022-09-17 NOTE — Progress Notes (Signed)
PCP - Dr. Bari Edward Cardiologist - Dr. Julien Nordmann  PPM/ICD - n/a  Chest x-ray - will need DOS EKG - 09/17/22 Stress Test - 09/14/22-normal ECHO - 10+ years ago Cardiac Cath - denies  Sleep Study - denies CPAP - denies  Blood Thinner Instructions: n/a Aspirin Instructions: n/a  NPO  COVID TEST- 09/17/22  Anesthesia review: No  Patient denies shortness of breath, fever, cough and chest pain at PAT appointment   All instructions explained to the patient, with a verbal understanding of the material. Patient agrees to go over the instructions while at home for a better understanding. Patient also instructed to self quarantine after being tested for COVID-19. The opportunity to ask questions was provided.

## 2022-09-18 LAB — SARS CORONAVIRUS 2 (TAT 6-24 HRS): SARS Coronavirus 2: NEGATIVE

## 2022-09-20 NOTE — Anesthesia Preprocedure Evaluation (Signed)
Anesthesia Evaluation  Patient identified by MRN, date of birth, ID band Patient awake    Reviewed: Allergy & Precautions, NPO status , Patient's Chart, lab work & pertinent test results  History of Anesthesia Complications (+) PONV and history of anesthetic complications  Airway Mallampati: II  TM Distance: >3 FB Neck ROM: Full    Dental  (+) Teeth Intact, Dental Advisory Given, Caps,    Pulmonary COPD, Patient abstained from smoking., former smoker NSCLC   Pulmonary exam normal breath sounds clear to auscultation       Cardiovascular + CAD  Normal cardiovascular exam+ dysrhythmias  Rhythm:Regular Rate:Normal     Neuro/Psych  Headaches  negative psych ROS   GI/Hepatic negative GI ROS, Neg liver ROS,,,  Endo/Other  negative endocrine ROS    Renal/GU negative Renal ROS     Musculoskeletal negative musculoskeletal ROS (+)    Abdominal   Peds  Hematology negative hematology ROS (+)   Anesthesia Other Findings   Reproductive/Obstetrics                             Anesthesia Physical Anesthesia Plan  ASA: 3  Anesthesia Plan: General   Post-op Pain Management: Tylenol PO (pre-op)*   Induction: Intravenous  PONV Risk Score and Plan: 4 or greater and Midazolam, Dexamethasone, Ondansetron and Scopolamine patch - Pre-op  Airway Management Planned: Double Lumen EBT  Additional Equipment: ClearSight  Intra-op Plan:   Post-operative Plan: Extubation in OR  Informed Consent: I have reviewed the patients History and Physical, chart, labs and discussed the procedure including the risks, benefits and alternatives for the proposed anesthesia with the patient or authorized representative who has indicated his/her understanding and acceptance.     Dental advisory given  Plan Discussed with: CRNA  Anesthesia Plan Comments:        Anesthesia Quick Evaluation

## 2022-09-21 ENCOUNTER — Inpatient Hospital Stay (HOSPITAL_COMMUNITY): Payer: Managed Care, Other (non HMO) | Admitting: Anesthesiology

## 2022-09-21 ENCOUNTER — Inpatient Hospital Stay (HOSPITAL_COMMUNITY): Payer: Managed Care, Other (non HMO)

## 2022-09-21 ENCOUNTER — Encounter (HOSPITAL_COMMUNITY)
Admission: RE | Disposition: A | Discharge status: Home / Self Care | Payer: Self-pay | Source: Home / Self Care | Attending: Thoracic Surgery (Cardiothoracic Vascular Surgery)

## 2022-09-21 ENCOUNTER — Other Ambulatory Visit: Payer: Self-pay

## 2022-09-21 ENCOUNTER — Inpatient Hospital Stay (HOSPITAL_COMMUNITY)
Admission: RE | Admit: 2022-09-21 | Discharge: 2022-09-23 | DRG: 164 | Disposition: A | Discharge status: Home / Self Care | Payer: Managed Care, Other (non HMO) | Attending: Thoracic Surgery (Cardiothoracic Vascular Surgery) | Admitting: Thoracic Surgery (Cardiothoracic Vascular Surgery)

## 2022-09-21 ENCOUNTER — Encounter (HOSPITAL_COMMUNITY): Payer: Self-pay | Admitting: Thoracic Surgery (Cardiothoracic Vascular Surgery)

## 2022-09-21 DIAGNOSIS — C3431 Malignant neoplasm of lower lobe, right bronchus or lung: Secondary | ICD-10-CM | POA: Diagnosis present

## 2022-09-21 DIAGNOSIS — Z880 Allergy status to penicillin: Secondary | ICD-10-CM | POA: Diagnosis not present

## 2022-09-21 DIAGNOSIS — E782 Mixed hyperlipidemia: Secondary | ICD-10-CM | POA: Diagnosis present

## 2022-09-21 DIAGNOSIS — I2510 Atherosclerotic heart disease of native coronary artery without angina pectoris: Secondary | ICD-10-CM | POA: Diagnosis present

## 2022-09-21 DIAGNOSIS — Z87891 Personal history of nicotine dependence: Secondary | ICD-10-CM

## 2022-09-21 DIAGNOSIS — Z800 Family history of malignant neoplasm of digestive organs: Secondary | ICD-10-CM | POA: Diagnosis not present

## 2022-09-21 DIAGNOSIS — Z887 Allergy status to serum and vaccine status: Secondary | ICD-10-CM

## 2022-09-21 DIAGNOSIS — Z79899 Other long term (current) drug therapy: Secondary | ICD-10-CM

## 2022-09-21 DIAGNOSIS — Z888 Allergy status to other drugs, medicaments and biological substances status: Secondary | ICD-10-CM | POA: Diagnosis not present

## 2022-09-21 DIAGNOSIS — Z8249 Family history of ischemic heart disease and other diseases of the circulatory system: Secondary | ICD-10-CM | POA: Diagnosis not present

## 2022-09-21 DIAGNOSIS — D72828 Other elevated white blood cell count: Secondary | ICD-10-CM | POA: Diagnosis not present

## 2022-09-21 DIAGNOSIS — J439 Emphysema, unspecified: Secondary | ICD-10-CM | POA: Diagnosis present

## 2022-09-21 DIAGNOSIS — Z8041 Family history of malignant neoplasm of ovary: Secondary | ICD-10-CM | POA: Diagnosis not present

## 2022-09-21 DIAGNOSIS — Z803 Family history of malignant neoplasm of breast: Secondary | ICD-10-CM

## 2022-09-21 DIAGNOSIS — E559 Vitamin D deficiency, unspecified: Secondary | ICD-10-CM | POA: Diagnosis present

## 2022-09-21 DIAGNOSIS — Z902 Acquired absence of lung [part of]: Principal | ICD-10-CM

## 2022-09-21 DIAGNOSIS — Z825 Family history of asthma and other chronic lower respiratory diseases: Secondary | ICD-10-CM

## 2022-09-21 DIAGNOSIS — F17210 Nicotine dependence, cigarettes, uncomplicated: Secondary | ICD-10-CM | POA: Diagnosis present

## 2022-09-21 DIAGNOSIS — J449 Chronic obstructive pulmonary disease, unspecified: Secondary | ICD-10-CM

## 2022-09-21 DIAGNOSIS — J9383 Other pneumothorax: Secondary | ICD-10-CM | POA: Diagnosis not present

## 2022-09-21 HISTORY — PX: INTERCOSTAL NERVE BLOCK: SHX5021

## 2022-09-21 HISTORY — PX: NODE DISSECTION: SHX5269

## 2022-09-21 LAB — ABO/RH: ABO/RH(D): A POS

## 2022-09-21 SURGERY — LOBECTOMY, LUNG, ROBOT-ASSISTED, USING VATS
Anesthesia: General | Site: Chest | Laterality: Right

## 2022-09-21 MED ORDER — ACETAMINOPHEN 500 MG PO TABS
1000.0000 mg | ORAL_TABLET | Freq: Once | ORAL | Status: AC
  Administered 2022-09-21: 1000 mg via ORAL
  Filled 2022-09-21: qty 2

## 2022-09-21 MED ORDER — PROPOFOL 10 MG/ML IV BOLUS
INTRAVENOUS | Status: DC | PRN
  Administered 2022-09-21: 140 mg via INTRAVENOUS

## 2022-09-21 MED ORDER — ORAL CARE MOUTH RINSE: 15.0000 mL | Freq: Once | OROMUCOSAL | Status: AC

## 2022-09-21 MED ORDER — ENOXAPARIN SODIUM 40 MG/0.4ML IJ SOSY
40.0000 mg | PREFILLED_SYRINGE | Freq: Every day | INTRAMUSCULAR | Status: DC
  Administered 2022-09-21 – 2022-09-23 (×3): 40 mg via SUBCUTANEOUS
  Filled 2022-09-21 (×3): qty 0.4

## 2022-09-21 MED ORDER — PROMETHAZINE HCL 25 MG/ML IJ SOLN: 6.2500 mg | INTRAMUSCULAR | Status: DC | PRN

## 2022-09-21 MED ORDER — VANCOMYCIN HCL IN DEXTROSE 1-5 GM/200ML-% IV SOLN
1000.0000 mg | Freq: Two times a day (BID) | INTRAVENOUS | Status: AC
  Administered 2022-09-21: 1000 mg via INTRAVENOUS
  Filled 2022-09-21: qty 200

## 2022-09-21 MED ORDER — CHLORHEXIDINE GLUCONATE 0.12 % MT SOLN
15.0000 mL | Freq: Once | OROMUCOSAL | Status: AC
  Administered 2022-09-21: 15 mL via OROMUCOSAL
  Filled 2022-09-21: qty 15

## 2022-09-21 MED ORDER — DIPHENHYDRAMINE HCL 25 MG PO CAPS: 25.0000 mg | ORAL_CAPSULE | Freq: Every day | ORAL | Status: DC | PRN

## 2022-09-21 MED ORDER — LACTATED RINGERS IV SOLN: INTRAVENOUS | Status: DC

## 2022-09-21 MED ORDER — MIDAZOLAM HCL 2 MG/2ML IJ SOLN
INTRAMUSCULAR | Status: AC
  Filled 2022-09-21: qty 2

## 2022-09-21 MED ORDER — PROPOFOL 500 MG/50ML IV EMUL
INTRAVENOUS | Status: DC | PRN
  Administered 2022-09-21: 150 ug/kg/min via INTRAVENOUS

## 2022-09-21 MED ORDER — PHENYLEPHRINE HCL-NACL 20-0.9 MG/250ML-% IV SOLN
INTRAVENOUS | Status: DC | PRN
  Administered 2022-09-21: 20 ug/min via INTRAVENOUS

## 2022-09-21 MED ORDER — ONDANSETRON HCL 4 MG/2ML IJ SOLN
INTRAMUSCULAR | Status: AC
  Filled 2022-09-21: qty 2

## 2022-09-21 MED ORDER — ROCURONIUM BROMIDE 10 MG/ML (PF) SYRINGE
PREFILLED_SYRINGE | INTRAVENOUS | Status: AC
  Filled 2022-09-21: qty 10

## 2022-09-21 MED ORDER — LORATADINE 10 MG PO TABS
10.0000 mg | ORAL_TABLET | Freq: Every day | ORAL | Status: DC
  Administered 2022-09-22 – 2022-09-23 (×2): 10 mg via ORAL
  Filled 2022-09-21 (×2): qty 1

## 2022-09-21 MED ORDER — PROPOFOL 10 MG/ML IV BOLUS
INTRAVENOUS | Status: AC
  Filled 2022-09-21: qty 20

## 2022-09-21 MED ORDER — FENTANYL CITRATE (PF) 250 MCG/5ML IJ SOLN
INTRAMUSCULAR | Status: AC
  Filled 2022-09-21: qty 5

## 2022-09-21 MED ORDER — PANTOPRAZOLE SODIUM 40 MG PO TBEC
40.0000 mg | DELAYED_RELEASE_TABLET | Freq: Every day | ORAL | Status: DC
  Administered 2022-09-22 – 2022-09-23 (×2): 40 mg via ORAL
  Filled 2022-09-21 (×2): qty 1

## 2022-09-21 MED ORDER — ALBUMIN HUMAN 5 % IV SOLN: INTRAVENOUS | Status: DC | PRN

## 2022-09-21 MED ORDER — METOCLOPRAMIDE HCL 5 MG/ML IJ SOLN
10.0000 mg | Freq: Four times a day (QID) | INTRAMUSCULAR | Status: AC
  Administered 2022-09-21 – 2022-09-22 (×3): 10 mg via INTRAVENOUS
  Filled 2022-09-21 (×3): qty 2

## 2022-09-21 MED ORDER — ONDANSETRON HCL 4 MG/2ML IJ SOLN: 4.0000 mg | Freq: Four times a day (QID) | INTRAMUSCULAR | Status: DC | PRN

## 2022-09-21 MED ORDER — ALBUTEROL SULFATE (2.5 MG/3ML) 0.083% IN NEBU: 2.5000 mg | INHALATION_SOLUTION | RESPIRATORY_TRACT | Status: DC | PRN

## 2022-09-21 MED ORDER — ACETAMINOPHEN 160 MG/5ML PO SOLN: 1000.0000 mg | Freq: Four times a day (QID) | ORAL | Status: DC

## 2022-09-21 MED ORDER — HEMOSTATIC AGENTS (NO CHARGE) OPTIME
TOPICAL | Status: DC | PRN
  Administered 2022-09-21: 1 via TOPICAL

## 2022-09-21 MED ORDER — PHENYLEPHRINE 80 MCG/ML (10ML) SYRINGE FOR IV PUSH (FOR BLOOD PRESSURE SUPPORT)
PREFILLED_SYRINGE | INTRAVENOUS | Status: DC | PRN
  Administered 2022-09-21: 160 ug via INTRAVENOUS

## 2022-09-21 MED ORDER — DEXAMETHASONE SODIUM PHOSPHATE 10 MG/ML IJ SOLN
INTRAMUSCULAR | Status: DC | PRN
  Administered 2022-09-21: 10 mg via INTRAVENOUS

## 2022-09-21 MED ORDER — LIDOCAINE 2% (20 MG/ML) 5 ML SYRINGE
INTRAMUSCULAR | Status: DC | PRN
  Administered 2022-09-21: 80 mg via INTRAVENOUS

## 2022-09-21 MED ORDER — PHENYLEPHRINE 80 MCG/ML (10ML) SYRINGE FOR IV PUSH (FOR BLOOD PRESSURE SUPPORT)
PREFILLED_SYRINGE | INTRAVENOUS | Status: AC
  Filled 2022-09-21: qty 10

## 2022-09-21 MED ORDER — OXYCODONE HCL 5 MG PO TABS
ORAL_TABLET | ORAL | Status: AC
  Filled 2022-09-21: qty 1

## 2022-09-21 MED ORDER — BUPIVACAINE LIPOSOME 1.3 % IJ SUSP
INTRAMUSCULAR | Status: AC
  Filled 2022-09-21: qty 20

## 2022-09-21 MED ORDER — ALBUTEROL SULFATE (2.5 MG/3ML) 0.083% IN NEBU
2.5000 mg | INHALATION_SOLUTION | Freq: Four times a day (QID) | RESPIRATORY_TRACT | Status: DC
  Administered 2022-09-21: 2.5 mg via RESPIRATORY_TRACT
  Filled 2022-09-21: qty 3

## 2022-09-21 MED ORDER — FENTANYL CITRATE (PF) 250 MCG/5ML IJ SOLN
INTRAMUSCULAR | Status: DC | PRN
  Administered 2022-09-21 (×2): 50 ug via INTRAVENOUS
  Administered 2022-09-21: 100 ug via INTRAVENOUS
  Administered 2022-09-21: 50 ug via INTRAVENOUS

## 2022-09-21 MED ORDER — EZETIMIBE 10 MG PO TABS
10.0000 mg | ORAL_TABLET | Freq: Every day | ORAL | Status: DC
  Administered 2022-09-23: 10 mg via ORAL
  Filled 2022-09-21 (×2): qty 1

## 2022-09-21 MED ORDER — FENTANYL CITRATE (PF) 100 MCG/2ML IJ SOLN
INTRAMUSCULAR | Status: AC
  Filled 2022-09-21: qty 2

## 2022-09-21 MED ORDER — ROCURONIUM BROMIDE 10 MG/ML (PF) SYRINGE
PREFILLED_SYRINGE | INTRAVENOUS | Status: DC | PRN
  Administered 2022-09-21: 60 mg via INTRAVENOUS
  Administered 2022-09-21: 40 mg via INTRAVENOUS

## 2022-09-21 MED ORDER — SODIUM CHLORIDE FLUSH 0.9 % IV SOLN: INTRAVENOUS | Status: DC | PRN

## 2022-09-21 MED ORDER — MONTELUKAST SODIUM 10 MG PO TABS
10.0000 mg | ORAL_TABLET | Freq: Every day | ORAL | Status: DC
  Administered 2022-09-21 – 2022-09-22 (×2): 10 mg via ORAL
  Filled 2022-09-21 (×2): qty 1

## 2022-09-21 MED ORDER — DEXAMETHASONE SODIUM PHOSPHATE 10 MG/ML IJ SOLN
INTRAMUSCULAR | Status: AC
  Filled 2022-09-21: qty 1

## 2022-09-21 MED ORDER — ORAL CARE MOUTH RINSE: 15.0000 mL | OROMUCOSAL | Status: DC | PRN

## 2022-09-21 MED ORDER — MAGNESIUM CHLORIDE 64 MG PO TBEC
1.0000 | DELAYED_RELEASE_TABLET | Freq: Every day | ORAL | Status: DC
  Administered 2022-09-22 – 2022-09-23 (×2): 64 mg via ORAL
  Filled 2022-09-21 (×3): qty 1

## 2022-09-21 MED ORDER — SENNOSIDES-DOCUSATE SODIUM 8.6-50 MG PO TABS
1.0000 | ORAL_TABLET | Freq: Every day | ORAL | Status: DC
  Administered 2022-09-21 – 2022-09-22 (×2): 1 via ORAL
  Filled 2022-09-21 (×2): qty 1

## 2022-09-21 MED ORDER — TRAMADOL HCL 50 MG PO TABS
50.0000 mg | ORAL_TABLET | Freq: Four times a day (QID) | ORAL | Status: DC | PRN
  Administered 2022-09-22: 50 mg via ORAL
  Filled 2022-09-21: qty 1

## 2022-09-21 MED ORDER — LACTATED RINGERS IV SOLN: INTRAVENOUS | Status: DC | PRN

## 2022-09-21 MED ORDER — 0.9 % SODIUM CHLORIDE (POUR BTL) OPTIME
TOPICAL | Status: DC | PRN
  Administered 2022-09-21: 2000 mL

## 2022-09-21 MED ORDER — ONDANSETRON HCL 4 MG/2ML IJ SOLN
INTRAMUSCULAR | Status: DC | PRN
  Administered 2022-09-21: 4 mg via INTRAVENOUS

## 2022-09-21 MED ORDER — SODIUM CHLORIDE 0.45 % IV SOLN: INTRAVENOUS | Status: AC

## 2022-09-21 MED ORDER — ACETAMINOPHEN 500 MG PO TABS
1000.0000 mg | ORAL_TABLET | Freq: Four times a day (QID) | ORAL | Status: DC
  Administered 2022-09-21 – 2022-09-23 (×7): 1000 mg via ORAL
  Filled 2022-09-21 (×7): qty 2

## 2022-09-21 MED ORDER — SUGAMMADEX SODIUM 200 MG/2ML IV SOLN
INTRAVENOUS | Status: DC | PRN
  Administered 2022-09-21: 200 mg via INTRAVENOUS

## 2022-09-21 MED ORDER — MORPHINE SULFATE (PF) 2 MG/ML IV SOLN: 2.0000 mg | INTRAVENOUS | Status: DC | PRN

## 2022-09-21 MED ORDER — OXYCODONE HCL 5 MG PO TABS
5.0000 mg | ORAL_TABLET | ORAL | Status: DC | PRN
  Administered 2022-09-21: 10 mg via ORAL
  Administered 2022-09-21: 5 mg via ORAL
  Administered 2022-09-22 – 2022-09-23 (×7): 10 mg via ORAL
  Filled 2022-09-21 (×8): qty 2

## 2022-09-21 MED ORDER — KETOROLAC TROMETHAMINE 15 MG/ML IJ SOLN
15.0000 mg | Freq: Four times a day (QID) | INTRAMUSCULAR | Status: AC
  Administered 2022-09-21 – 2022-09-23 (×7): 15 mg via INTRAVENOUS
  Filled 2022-09-21 (×7): qty 1

## 2022-09-21 MED ORDER — VANCOMYCIN HCL IN DEXTROSE 1-5 GM/200ML-% IV SOLN
1000.0000 mg | INTRAVENOUS | Status: AC
  Administered 2022-09-21: 1000 mg via INTRAVENOUS
  Filled 2022-09-21: qty 200

## 2022-09-21 MED ORDER — TRIAMCINOLONE ACETONIDE 55 MCG/ACT NA AERO
2.0000 | INHALATION_SPRAY | Freq: Every day | NASAL | Status: DC
  Administered 2022-09-22 – 2022-09-23 (×2): 2 via NASAL
  Filled 2022-09-21 (×2): qty 21.6

## 2022-09-21 MED ORDER — ALUM & MAG HYDROXIDE-SIMETH 200-200-20 MG/5ML PO SUSP: 30.0000 mL | Freq: Four times a day (QID) | ORAL | Status: DC | PRN

## 2022-09-21 MED ORDER — MIDAZOLAM HCL 2 MG/2ML IJ SOLN
INTRAMUSCULAR | Status: DC | PRN
  Administered 2022-09-21: 2 mg via INTRAVENOUS

## 2022-09-21 MED ORDER — BISACODYL 5 MG PO TBEC
10.0000 mg | DELAYED_RELEASE_TABLET | Freq: Every day | ORAL | Status: DC
  Administered 2022-09-22 – 2022-09-23 (×2): 10 mg via ORAL
  Filled 2022-09-21 (×2): qty 2

## 2022-09-21 MED ORDER — LIDOCAINE 2% (20 MG/ML) 5 ML SYRINGE
INTRAMUSCULAR | Status: AC
  Filled 2022-09-21: qty 5

## 2022-09-21 MED ORDER — FENTANYL CITRATE (PF) 100 MCG/2ML IJ SOLN
25.0000 ug | INTRAMUSCULAR | Status: DC | PRN
  Administered 2022-09-21 (×3): 50 ug via INTRAVENOUS

## 2022-09-21 MED ORDER — BUPIVACAINE HCL (PF) 0.5 % IJ SOLN
INTRAMUSCULAR | Status: AC
  Filled 2022-09-21: qty 30

## 2022-09-21 MED ORDER — ALBUTEROL SULFATE (2.5 MG/3ML) 0.083% IN NEBU: 2.5000 mg | INHALATION_SOLUTION | RESPIRATORY_TRACT | Status: DC

## 2022-09-21 SURGICAL SUPPLY — 99 items
ADH SKN CLS APL DERMABOND .7 (GAUZE/BANDAGES/DRESSINGS) ×1
APL PRP STRL LF DISP 70% ISPRP (MISCELLANEOUS) ×1
BAG SPEC RTRVL C1550 15 (MISCELLANEOUS) ×1
BLADE CLIPPER SURG (BLADE) ×2 IMPLANT
BLADE SURG 11 STRL SS (BLADE) ×2 IMPLANT
CANISTER SUCT 3000ML PPV (MISCELLANEOUS) ×4 IMPLANT
CANNULA REDUCER 12-8 DVNC XI (CANNULA) ×4 IMPLANT
CATH THORACIC 28FR (CATHETERS) ×2 IMPLANT
CHLORAPREP W/TINT 26 (MISCELLANEOUS) ×2 IMPLANT
CLIP TI MEDIUM 6 (CLIP) IMPLANT
CNTNR URN SCR LID CUP LEK RST (MISCELLANEOUS) ×10 IMPLANT
CONN ST 1/4X3/8  BEN (MISCELLANEOUS)
CONN ST 1/4X3/8 BEN (MISCELLANEOUS) IMPLANT
CONT SPEC 4OZ STRL OR WHT (MISCELLANEOUS) ×5
DEFOGGER SCOPE WARMER CLEARIFY (MISCELLANEOUS) ×2 IMPLANT
DERMABOND ADVANCED .7 DNX12 (GAUZE/BANDAGES/DRESSINGS) ×2 IMPLANT
DRAIN CHANNEL 28F RND 3/8 FF (WOUND CARE) IMPLANT
DRAPE ARM DVNC X/XI (DISPOSABLE) ×8 IMPLANT
DRAPE COLUMN DVNC XI (DISPOSABLE) ×2 IMPLANT
DRAPE CV SPLIT W-CLR ANES SCRN (DRAPES) ×2 IMPLANT
DRAPE ORTHO SPLIT 77X108 STRL (DRAPES) ×1
DRAPE SURG ORHT 6 SPLT 77X108 (DRAPES) ×2 IMPLANT
ELECT BLADE 6.5 EXT (BLADE) IMPLANT
ELECT REM PT RETURN 9FT ADLT (ELECTROSURGICAL) ×1
ELECTRODE REM PT RTRN 9FT ADLT (ELECTROSURGICAL) ×2 IMPLANT
FORCEPS BPLR LNG DVNC XI (INSTRUMENTS) IMPLANT
FORCEPS CADIERE DVNC XI (FORCEP) IMPLANT
GAUZE KITTNER 4X8 (MISCELLANEOUS) ×2 IMPLANT
GAUZE SPONGE 4X4 12PLY STRL (GAUZE/BANDAGES/DRESSINGS) ×2 IMPLANT
GLOVE BIO SURGEON STRL SZ7.5 (GLOVE) ×4 IMPLANT
GLOVE SURG POLYISO LF SZ8 (GLOVE) ×2 IMPLANT
GOWN STRL REUS W/ TWL LRG LVL3 (GOWN DISPOSABLE) ×4 IMPLANT
GOWN STRL REUS W/ TWL XL LVL3 (GOWN DISPOSABLE) ×4 IMPLANT
GOWN STRL REUS W/TWL 2XL LVL3 (GOWN DISPOSABLE) ×2 IMPLANT
GOWN STRL REUS W/TWL LRG LVL3 (GOWN DISPOSABLE) ×3
GOWN STRL REUS W/TWL XL LVL3 (GOWN DISPOSABLE) ×2
GRASPER TIP-UP FEN DVNC XI (INSTRUMENTS) IMPLANT
HEMOSTAT SURGICEL 2X14 (HEMOSTASIS) ×2 IMPLANT
IRRIGATION STRYKERFLOW (MISCELLANEOUS) IMPLANT
IRRIGATOR STRYKERFLOW (MISCELLANEOUS)
KIT BASIN OR (CUSTOM PROCEDURE TRAY) ×2 IMPLANT
KIT TURNOVER KIT B (KITS) ×2 IMPLANT
NDL 22X1.5 STRL (OR ONLY) (MISCELLANEOUS) ×2 IMPLANT
NEEDLE 22X1.5 STRL (OR ONLY) (MISCELLANEOUS) ×1 IMPLANT
NS IRRIG 1000ML POUR BTL (IV SOLUTION) ×6 IMPLANT
PACK CHEST (CUSTOM PROCEDURE TRAY) ×2 IMPLANT
PAD ARMBOARD 7.5X6 YLW CONV (MISCELLANEOUS) ×10 IMPLANT
PORT ACCESS TROCAR AIRSEAL 12 (TROCAR) IMPLANT
RELOAD STAPLE 45 2.5 WHT DVNC (STAPLE) IMPLANT
RELOAD STAPLE 45 3.5 BLU DVNC (STAPLE) IMPLANT
RELOAD STAPLE 45 4.3 GRN DVNC (STAPLE) IMPLANT
RELOAD STAPLER 2.5X45 WHT DVNC (STAPLE) ×2 IMPLANT
RELOAD STAPLER 3.5X45 BLU DVNC (STAPLE) ×6 IMPLANT
RELOAD STAPLER 4.3X45 GRN DVNC (STAPLE) ×1 IMPLANT
SCISSORS LAP 5X35 DISP (ENDOMECHANICALS) IMPLANT
SEAL CANN UNIV 5-8 DVNC XI (MISCELLANEOUS) ×4 IMPLANT
SEAL UNIV 5-12 XI (MISCELLANEOUS) IMPLANT
SEALANT PROGEL (MISCELLANEOUS) IMPLANT
SEALER LIGASURE MARYLAND 30 (ELECTROSURGICAL) IMPLANT
SET TRI-LUMEN FLTR TB AIRSEAL (TUBING) ×2 IMPLANT
SOL ELECTROSURG ANTI STICK (MISCELLANEOUS)
SOLUTION ELECTROSURG ANTI STCK (MISCELLANEOUS) IMPLANT
SPONGE INTESTINAL PEANUT (DISPOSABLE) IMPLANT
SPONGE TONSIL 1 RF SGL (DISPOSABLE) IMPLANT
STAPLER CANNULA SEAL DVNC XI (STAPLE) ×4 IMPLANT
STAPLER RELOAD 2.5X45 WHT DVNC (STAPLE) ×2
STAPLER RELOAD 3.5X45 BLU DVNC (STAPLE) ×6
STAPLER RELOAD 4.3X45 GRN DVNC (STAPLE) ×1
STOPCOCK 4 WAY LG BORE MALE ST (IV SETS) ×2 IMPLANT
SUT MNCRL AB 3-0 PS2 18 (SUTURE) IMPLANT
SUT MON AB 2-0 CT1 36 (SUTURE) IMPLANT
SUT PDS AB 1 CTX 36 (SUTURE) IMPLANT
SUT PROLENE 4 0 RB 1 (SUTURE)
SUT PROLENE 4-0 RB1 .5 CRCL 36 (SUTURE) IMPLANT
SUT SILK  1 MH (SUTURE) ×1
SUT SILK 1 MH (SUTURE) ×2 IMPLANT
SUT SILK 1 TIES 10X30 (SUTURE) IMPLANT
SUT SILK 2 0 SH (SUTURE) IMPLANT
SUT SILK 2 0SH CR/8 30 (SUTURE) IMPLANT
SUT VIC AB 1 CTX 36 (SUTURE)
SUT VIC AB 1 CTX36XBRD ANBCTR (SUTURE) IMPLANT
SUT VIC AB 2-0 CT1 27 (SUTURE) ×1
SUT VIC AB 2-0 CT1 TAPERPNT 27 (SUTURE) ×2 IMPLANT
SUT VIC AB 3-0 SH 27 (SUTURE) ×2
SUT VIC AB 3-0 SH 27X BRD (SUTURE) ×4 IMPLANT
SUT VICRYL 0 TIES 12 18 (SUTURE) ×2 IMPLANT
SUT VICRYL 0 UR6 27IN ABS (SUTURE) ×4 IMPLANT
SUT VICRYL 2 TP 1 (SUTURE) IMPLANT
SYR 10ML LL (SYRINGE) ×2 IMPLANT
SYR 20ML LL LF (SYRINGE) ×2 IMPLANT
SYR 50ML LL SCALE MARK (SYRINGE) ×2 IMPLANT
SYSTEM RETRIEVAL ANCHOR 15 (MISCELLANEOUS) IMPLANT
SYSTEM SAHARA CHEST DRAIN ATS (WOUND CARE) ×2 IMPLANT
TAPE CLOTH 4X10 WHT NS (GAUZE/BANDAGES/DRESSINGS) ×2 IMPLANT
TAPE CLOTH SURG 4X10 WHT LF (GAUZE/BANDAGES/DRESSINGS) IMPLANT
TIP APPLICATOR SPRAY EXTEND 16 (VASCULAR PRODUCTS) IMPLANT
TOWEL GREEN STERILE (TOWEL DISPOSABLE) ×2 IMPLANT
TRAY FOLEY MTR SLVR 16FR STAT (SET/KITS/TRAYS/PACK) ×2 IMPLANT
TUBING EXTENTION W/L.L. (IV SETS) ×2 IMPLANT

## 2022-09-21 NOTE — Anesthesia Procedure Notes (Addendum)
Procedure Name: Intubation Date/Time: 09/21/2022 8:00 AM  Performed by: Katina Degree, CRNAPre-anesthesia Checklist: Patient identified, Emergency Drugs available, Suction available and Patient being monitored Patient Re-evaluated:Patient Re-evaluated prior to induction Oxygen Delivery Method: Circle system utilized Preoxygenation: Pre-oxygenation with 100% oxygen Induction Type: IV induction Ventilation: Mask ventilation without difficulty Laryngoscope Size: Mac and 4 Grade View: Grade I Tube type: Oral Endobronchial tube: Double lumen EBT, Left, EBT position confirmed by auscultation and EBT position confirmed by fiberoptic bronchoscope and 37 Fr Number of attempts: 1 Airway Equipment and Method: Stylet and Oral airway Placement Confirmation: ETT inserted through vocal cords under direct vision, positive ETCO2 and breath sounds checked- equal and bilateral Secured at: 30 cm Tube secured with: Tape Dental Injury: Teeth and Oropharynx as per pre-operative assessment and Injury to lip  Comments: Intubated by Noralee Stain. Small injury to right lower lip.

## 2022-09-21 NOTE — Hospital Course (Addendum)
History of Present Illness:  Sharon [name] is a 62 yo female who recently enrolled in the low dose lung cancer screening program.  She was found to have a right lower lobe nodule.  This has been followed, however most recent scan showed a growth from 10-15 mm.  She underwent PET CT which showed the nodule to be hypermetabolic.  She underwent Bronchoscopy with Navigational Biopsy which confirmed the nodule to be non-small cell lung cancer.  She was subsequently referred to [facility] and evaluated by Dr. Cliffton Asters.  At that time she denied chest pain, shortness of breath.  She continued to smoke but was down to 5 cigarettes per day and she was willing to quit.  He recommended she undergo Robotic Assisted Right Lower lobectomy.  The risks and benefits of the procedure were explained to the patient and she was agreeable to proceed.    Hospital Course:  Sharon [name] presented to [facility] on 09/21/2022.  She was taken to the operating room and underwent Robotic Assisted Right Video Thoracoscopy with Right Lower Lobectomy, Lymph Node Dissection, and Intercostal Nerve Block.  She tolerated the procedure without difficulty, was extubated, and taken to the PACU in stable condition.

## 2022-09-21 NOTE — Anesthesia Postprocedure Evaluation (Signed)
Anesthesia Post Note  Patient: Sharon [name]  Procedure(s) Performed: XI ROBOTIC ASSISTED THORACOSCOPY-RIGHT LOWER LOBECTOMY (Right: Chest) NODE DISSECTION (Right: Chest) INTERCOSTAL NERVE BLOCK (Right: Chest)     Patient location during evaluation: PACU Anesthesia Type: General Level of consciousness: awake and alert Pain management: pain level controlled Vital Signs Assessment: post-procedure vital signs reviewed and stable Respiratory status: spontaneous breathing, nonlabored ventilation, respiratory function stable and patient connected to nasal cannula oxygen Cardiovascular status: blood pressure returned to baseline and stable Postop Assessment: no apparent nausea or vomiting Anesthetic complications: no   No notable events documented.  Last Vitals:  Vitals:   09/21/22 1037 09/21/22 1045  BP: (!) 165/87 (!) 156/74  Pulse: 80 80  Resp: 15 17  Temp: 36.6 C   SpO2: 100% 92%    Last Pain:  Vitals:   09/21/22 8657  PainSc: 5                  Collene Schlichter

## 2022-09-21 NOTE — Interval H&P Note (Signed)
History and Physical Interval Note:  09/21/2022 7:37 AM  Sharon [name]  has presented today for surgery, with the diagnosis of NSCLC.  The various methods of treatment have been discussed with the patient and family. After consideration of risks, benefits and other options for treatment, the patient has consented to  Procedure(s): XI ROBOTIC ASSISTED THORACOSCOPY-RIGHT LOWER LOBECTOMY (Right) as a surgical intervention.  The patient's history has been reviewed, patient examined, no change in status, stable for surgery.  I have reviewed the patient's chart and labs.  Questions were answered to the patient's satisfaction.     [name]

## 2022-09-21 NOTE — Brief Op Note (Signed)
09/21/2022  10:11 AM  PATIENT:  Sharon [name]  62 y.o. female  PRE-OPERATIVE DIAGNOSIS:  RIGHT LOWER LOBE ADENOCARCINOMA  POST-OPERATIVE DIAGNOSIS:  RIGHT LOWER LOBE ADENOCARCINOMA  PROCEDURE:  Procedure(s):  XI ROBOTIC ASSISTED THORACOSCOPY-RIGHT LOWER LOBECTOMY  NODE DISSECTION (Right) INTERCOSTAL NERVE BLOCK (Right)  SURGEON:  Surgeon(s) and Role:    * Lightfoot, Eliezer Lofts, MD - Primary  PHYSICIAN ASSISTANT: Lowella Dandy PA-C  ASSISTANTS: Karrie [place]aziland RNFA   ANESTHESIA:   general  EBL: Per Anesthesia Record  BLOOD ADMINISTERED:none  DRAINS:  19 Blake    LOCAL MEDICATIONS USED:  BUPIVICAINE   SPECIMEN:  Source of Specimen:  Lymph Nodes, Right Lower Lobe Lung  DISPOSITION OF SPECIMEN:  PATHOLOGY  COUNTS:  YES  TOURNIQUET:  * No tourniquets in log *  DICTATION: .Dragon Dictation  PLAN OF CARE: Admit to inpatient   PATIENT DISPOSITION:  PACU - hemodynamically stable.   Delay start of Pharmacological VTE agent (>24hrs) due to surgical blood loss or risk of bleeding: no

## 2022-09-21 NOTE — Op Note (Signed)
      [address]       [place] [address]             [phone]        09/21/2022  Patient:  Sharon [name] Pre-Op Dx: Right lower lobe NSCLC   Post-op Dx:  same Procedure: - Robotic assisted right video thoracoscopy - Right lower lobectomy - Mediastinal lymph node sampling - Intercostal nerve block  Surgeon and Role:      * Lalah Durango, Eliezer Lofts, MD - Primary  Assistant: Jaclyn Prime, PA-C  An experienced assistant was required given the complexity of this surgery and the standard of surgical care. The assistant was needed for exposure, dissection, suctioning, retraction of delicate tissues and sutures, instrument exchange and for overall help during this procedure.    Anesthesia  general EBL:  100 ml Blood Administration: none Specimen:  right lower lobe, hilar and mediastinal nodes  Drains: 28 F argyle chest tube in right chest Counts: correct   Indications: 62yo female with biopsy proven RLL adenocarcinoma.  PFT are acceptable.  We discussed the risks and benefits of a right robotic assisted right lower lobectomy.  She is agreeable to proceed.   Findings: Normal anatomy  Operative Technique: After the risks, benefits and alternatives were thoroughly discussed, the patient was brought to the operative theatre.  Anesthesia was induced, and the patient was then placed in a lateral decubitus position and was prepped and draped in normal sterile fashion.  An appropriate surgical pause was performed, and pre-operative antibiotics were dosed accordingly.  We began by placing our 4 robotic ports in the the 7th intercostal space targeting the hilum of the lung.  A 12mm assistant port was placed in the 9th intercostal space in the anterior axillary line.  The robot was then docked and all instruments were passed under direct visualization.    The lung was then retracted superiorly, and the inferior pulmonary ligament was divided.  The hilum was mobilized  anteriorly and posteriorly.  We identified the right lower lobe pulmonary vein, and after careful isolation, it was divided with a vascular stapler.  We next moved to the pulmonary artery.  The artery was then divided with a vascular load stapler.  The bronchus to the lower lobe was then isolated.  After a test clamp, with good ventilation of the remaining lung, the bronchus was then divided.  The fissure was completed, and the specimen was passed into an endocatch bag.  It was removed from the anterior access site.    Lymph nodes were then sampled at hilum and mediastinum.  The chest was irrigated, and an air leak test was performed.  An intercostal nerve block was performed under direct visualization.  A 28 F chest tube was then placed, and we watch the remaining lobes re-expand.  The skin and soft tissue were closed with absorbable suture    The patient tolerated the procedure without any immediate complications, and was transferred to the PACU in stable condition.  Anecia Nusbaum Keane Scrape

## 2022-09-21 NOTE — Transfer of Care (Signed)
Immediate Anesthesia Transfer of Care Note  Patient: Sharon [name]  Procedure(s) Performed: XI ROBOTIC ASSISTED THORACOSCOPY-RIGHT LOWER LOBECTOMY (Right: Chest) NODE DISSECTION (Right: Chest) INTERCOSTAL NERVE BLOCK (Right: Chest)  Patient Location: PACU  Anesthesia Type:General  Level of Consciousness: awake and alert   Airway & Oxygen Therapy: Patient Spontanous Breathing and Patient connected to face mask oxygen  Post-op Assessment: Report given to RN and Post -op Vital signs reviewed and stable  Post vital signs: Reviewed and stable  Last Vitals:  Vitals Value Taken Time  BP    Temp    Pulse 81 09/21/22 1041  Resp 18 09/21/22 1041  SpO2 98 % 09/21/22 1041  Vitals shown include unvalidated device data.  Last Pain:  Vitals:   09/21/22 0652  PainSc: 5          Complications: No notable events documented.

## 2022-09-22 ENCOUNTER — Inpatient Hospital Stay (HOSPITAL_COMMUNITY): Payer: Managed Care, Other (non HMO)

## 2022-09-22 ENCOUNTER — Encounter (HOSPITAL_COMMUNITY): Payer: Self-pay | Admitting: Thoracic Surgery (Cardiothoracic Vascular Surgery)

## 2022-09-22 LAB — CBC
HCT: 37.2 % (ref 36.0–46.0)
Hemoglobin: 12.5 g/dL (ref 12.0–15.0)
MCH: 31.7 pg (ref 26.0–34.0)
MCHC: 33.6 g/dL (ref 30.0–36.0)
MCV: 94.4 fL (ref 80.0–100.0)
Platelets: 265 10*3/uL (ref 150–400)
RBC: 3.94 MIL/uL (ref 3.87–5.11)
RDW: 11.9 % (ref 11.5–15.5)
WBC: 20.4 10*3/uL — ABNORMAL HIGH (ref 4.0–10.5)
nRBC: 0 % (ref 0.0–0.2)

## 2022-09-22 LAB — BASIC METABOLIC PANEL
Anion gap: 7 (ref 5–15)
BUN: 10 mg/dL (ref 8–23)
CO2: 23 mmol/L (ref 22–32)
Calcium: 9 mg/dL (ref 8.9–10.3)
Chloride: 101 mmol/L (ref 98–111)
Creatinine, Ser: 0.63 mg/dL (ref 0.44–1.00)
GFR, Estimated: >60 mL/min (ref >60)
Glucose, Bld: 124 mg/dL — ABNORMAL HIGH (ref 70–99)
Potassium: 3.9 mmol/L (ref 3.5–5.1)
Sodium: 131 mmol/L — ABNORMAL LOW (ref 135–145)

## 2022-09-22 NOTE — Progress Notes (Addendum)
      [address]       [place] [address]             [phone]      1 Day Post-Op Procedure(s) (LRB): XI ROBOTIC ASSISTED THORACOSCOPY-RIGHT LOWER LOBECTOMY (Right) NODE DISSECTION (Right) INTERCOSTAL NERVE BLOCK (Right)  Subjective:  Patient is sitting up in chair.  She states her pain is well controlled, rates it about a 4.  Denies N/V.  She is passing gas  Objective: Vital signs in last 24 hours: Temp:  [97.5 F (36.4 C)-98.2 F (36.8 C)] 98.1 F (36.7 C) (05/14 0416) Pulse Rate:  [67-87] 67 (05/14 0416) Cardiac Rhythm: Normal sinus rhythm;Bundle branch block (05/13 1924) Resp:  [10-20] 15 (05/14 0416) BP: (102-165)/(57-87) 115/64 (05/14 0416) SpO2:  [89 %-100 %] 93 % (05/14 0416)  Intake/Output from previous day: 05/13 0701 - 05/14 0700 In: 2512.6 [P.O.:200; I.V.:1862.5; IV Piggyback:450.1] Out: 1294 [Urine:1090; Blood:20; Chest Tube:184]  General appearance: alert, cooperative, and no distress Heart: regular rate and rhythm Lungs: clear to auscultation bilaterally Abdomen: soft, non-tender; bowel sounds normal; no masses,  no organomegaly Extremities: extremities normal, atraumatic, no cyanosis or edema Wound: clean and dry  Lab Results: Recent Labs    09/22/22 0020  WBC 20.4*  HGB 12.5  HCT 37.2  PLT 265   BMET:  Recent Labs    09/22/22 0020  NA 131*  K 3.9  CL 101  CO2 23  GLUCOSE 124*  BUN 10  CREATININE 0.63  CALCIUM 9.0    PT/INR: No results for input(s): "LABPROT", "INR" in the last 72 hours. ABG No results found for: "PHART", "HCO3", "TCO2", "ACIDBASEDEF", "O2SAT" CBG (last 3)  No results for input(s): "GLUCAP" in the last 72 hours.  Assessment/Plan: S/P Procedure(s) (LRB): XI ROBOTIC ASSISTED THORACOSCOPY-RIGHT LOWER LOBECTOMY (Right) NODE DISSECTION (Right) INTERCOSTAL NERVE BLOCK (Right)  CV- NSR, BP stable Pulm- CT on water seal, + air leak with cough.. CXR with apical pneumothorax..? Leave on water seal  vs. Suction for a few hours to expand lungs Renal- creatinine stable, K is at 3.9 ID- + leukocytosis, likely SIRs , monitor Lovenox for DVT prophylaxis Dispo- patient stable, + air leak on CT, CXR with pneumothorax, repeat CXR in AM.. discuss suction trial with Dr. Cliffton Asters   LOS: 1 day    Lowella Dandy, PA-C 09/22/2022   Agree with above Clamping trial  Home if CXR stable  Nastasha Reising O Bennie Chirico

## 2022-09-22 NOTE — Progress Notes (Signed)
  Transition of Care (TOC) Screening Note   Patient Details  Name: Sharon [name] Date of Birth: March 07, 1961   Transition of Care [facility]) CM/SW Contact:    Harriet Masson, RN Phone Number: 09/22/2022, 8:47 AM    Transition of Care Department [facility]) has reviewed patient and no TOC needs have been identified at this time. We will continue to monitor patient advancement through interdisciplinary progression rounds. If new patient transition needs arise, please place a TOC consult.  Post op day 1 XI ROBOTIC ASSISTED THORACOSCOPY-RIGHT LOWER LOBECTOMY.

## 2022-09-23 ENCOUNTER — Inpatient Hospital Stay (HOSPITAL_COMMUNITY): Payer: Managed Care, Other (non HMO)

## 2022-09-23 LAB — COMPREHENSIVE METABOLIC PANEL
ALT: 81 U/L — ABNORMAL HIGH (ref 0–44)
AST: 91 U/L — ABNORMAL HIGH (ref 15–41)
Albumin: 3.2 g/dL — ABNORMAL LOW (ref 3.5–5.0)
Alkaline Phosphatase: 96 U/L (ref 38–126)
Anion gap: 8 (ref 5–15)
BUN: 11 mg/dL (ref 8–23)
CO2: 25 mmol/L (ref 22–32)
Calcium: 8.9 mg/dL (ref 8.9–10.3)
Chloride: 95 mmol/L — ABNORMAL LOW (ref 98–111)
Creatinine, Ser: 0.69 mg/dL (ref 0.44–1.00)
GFR, Estimated: >60 mL/min (ref >60)
Glucose, Bld: 110 mg/dL — ABNORMAL HIGH (ref 70–99)
Potassium: 4 mmol/L (ref 3.5–5.1)
Sodium: 128 mmol/L — ABNORMAL LOW (ref 135–145)
Total Bilirubin: 0.9 mg/dL (ref 0.3–1.2)
Total Protein: 5.3 g/dL — ABNORMAL LOW (ref 6.5–8.1)

## 2022-09-23 LAB — CBC
HCT: 36 % (ref 36.0–46.0)
Hemoglobin: 12.1 g/dL (ref 12.0–15.0)
MCH: 32 pg (ref 26.0–34.0)
MCHC: 33.6 g/dL (ref 30.0–36.0)
MCV: 95.2 fL (ref 80.0–100.0)
Platelets: 251 10*3/uL (ref 150–400)
RBC: 3.78 MIL/uL — ABNORMAL LOW (ref 3.87–5.11)
RDW: 11.9 % (ref 11.5–15.5)
WBC: 13.7 10*3/uL — ABNORMAL HIGH (ref 4.0–10.5)
nRBC: 0 % (ref 0.0–0.2)

## 2022-09-23 LAB — SURGICAL PATHOLOGY

## 2022-09-23 MED ORDER — OXYCODONE HCL 5 MG PO TABS: 5.0000 mg | ORAL_TABLET | ORAL | 0 refills | Status: DC | PRN

## 2022-09-23 NOTE — Discharge Summary (Signed)
Physician Discharge Summary  Patient ID: Sharon [name] MRN: [id number] DOB/AGE: [birth year] 62 y.o.  Admit date: 09/21/2022 Discharge date: 09/23/2022  Admission Diagnoses:  Patient Active Problem List   Diagnosis Date Noted   Primary cancer of right lower lobe of lung (HCC) 08/28/2022   Coronary artery calcification 07/19/2022   RBBB (right bundle branch block) 08/25/2021   Aortic atherosclerosis (HCC) 06/13/2021   Multinodular goiter (nontoxic) 04/08/2020   Personal history of colonic polyps    Mixed hyperlipidemia 03/11/2018   Pulmonary nodule 03/11/2018   Family history of breast cancer    Vitamin D deficiency 09/28/2016   Increased risk of breast cancer 09/08/2016   Environmental and seasonal allergies 05/22/2016   Muscle spasms of neck 03/11/2015   Acne erythematosa 03/11/2015   Seborrhea capitis 03/11/2015   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   Compulsive tobacco user syndrome 03/11/2015   Discharge Diagnoses:   Patient Active Problem List   Diagnosis Date Noted   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/2022   Primary cancer of right lower lobe of lung (HCC) 08/28/2022   Coronary artery calcification 07/19/2022   RBBB (right bundle branch block) 08/25/2021   Aortic atherosclerosis (HCC) 06/13/2021   Multinodular goiter (nontoxic) 04/08/2020   Personal history of colonic polyps    Mixed hyperlipidemia 03/11/2018   Pulmonary nodule 03/11/2018   Family history of breast cancer    Vitamin D deficiency 09/28/2016   Increased risk of breast cancer 09/08/2016   Environmental and seasonal allergies 05/22/2016   Muscle spasms of neck 03/11/2015   Acne erythematosa 03/11/2015   Seborrhea capitis 03/11/2015   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   Compulsive tobacco user syndrome 03/11/2015   Discharged Condition: good  History of Present Illness:  Sharon [name] is a 62 yo female who recently enrolled in the low dose lung cancer screening program.  She was found to have a  right lower lobe nodule.  This has been followed, however most recent scan showed a growth from 10-15 mm.  She underwent PET CT which showed the nodule to be hypermetabolic.  She underwent Bronchoscopy with Navigational Biopsy which confirmed the nodule to be non-small cell lung cancer.  She was subsequently referred to [facility] and evaluated by Dr. Cliffton Asters.  At that time she denied chest pain, shortness of breath.  She continued to smoke but was down to 5 cigarettes per day and she was willing to quit.  He recommended she undergo Robotic Assisted Right Lower lobectomy.  The risks and benefits of the procedure were explained to the patient and she was agreeable to proceed.    Hospital Course:  Sharon [name] presented to [facility] on 09/21/2022.  She was taken to the operating room and underwent Robotic Assisted Right Video Thoracoscopy with Right Lower Lobectomy, Lymph Node Dissection, and Intercostal Nerve Block.  She tolerated the procedure without difficulty, was extubated, and taken to the PACU in stable condition.  The patient's chest xray had a right apical pneumothorax.  The patient's chest tube was clamped.  Repeat CXR was performed and showed slight increase in pneumothorax.  She was kept overnight for additional observation.  Her chest tube did not have an air leak.  This was removed without difficulty on 5/15.  Follow up CXR showed an unchanged right apical pneumothorax.  She is ambulating without difficulty.  Her surgical incisions are free from evidence of infection. As discussed with Dr. Cliffton Asters, she is stable for discharge home today.  Consults: None  Significant Diagnostic Studies:  CLINICAL DATA:  Initial treatment strategy for pulmonary nodule.   EXAM: NUCLEAR MEDICINE PET SKULL BASE TO THIGH   TECHNIQUE: 10.2 mCi F-18 FDG was injected intravenously. Full-ring PET imaging was performed from the skull base to thigh after the radiotracer.  CT data was obtained and used for attenuation correction and anatomic localization.   Fasting blood glucose: 115 mg/dl   COMPARISON:  Lung cancer screening chest CT 06/17/2022   FINDINGS: Mediastinal blood pool activity: SUV max 2.0   Liver activity: SUV max NA   NECK: No hypermetabolic lymph nodes in the neck.   Incidental CT findings: None.   CHEST: The posterior right lower lobe pulmonary nodule of concern on recent lung cancer screening chest CT shows no hypermetabolism on PET imaging today with SUV max = 1.4. No hypermetabolic lymphadenopathy in the chest.   Incidental CT findings: Mild atherosclerotic calcification is noted in the wall of the thoracic aorta.   ABDOMEN/PELVIS: No abnormal hypermetabolic activity within the liver, pancreas, adrenal glands, or spleen. No hypermetabolic lymph nodes in the abdomen or pelvis.   Incidental CT findings: Calcified gallstones evident measuring up to 2.9 cm diameter. Bilateral adrenal nodules again noted, previously characterized as adenomas. No hypermetabolism in either nodule on PET imaging today. No followup imaging is recommended. There is moderate atherosclerotic calcification of the abdominal aorta without aneurysm.   SKELETON: No focal hypermetabolic activity to suggest skeletal metastasis.   Incidental CT findings: None.   IMPRESSION: 1. The posterior right lower lobe pulmonary nodule of concern on recent lung cancer screening chest CT shows no hypermetabolism on PET imaging today. While reassuring, low grade or well differentiated neoplasm can be poorly FDG avid. 2. Cholelithiasis. 3.  Aortic Atherosclerosis (ICD10-I70.0).     Electronically Signed   By: Kennith Center M.D.   On: 07/01/2022 06:27  Treatments: surgery:   09/21/2022   Patient:  Sharon [name] Pre-Op Dx: Right lower lobe NSCLC   Post-op Dx:  same Procedure: - Robotic assisted right video thoracoscopy - Right lower lobectomy -  Mediastinal lymph node sampling - Intercostal nerve block   Surgeon and Role:      * Lightfoot, Eliezer Lofts, MD - Primary   Assistant: Jaclyn Prime, PA-C  An experienced assistant was required given the complexity of this surgery and the standard of surgical care. The assistant was needed for exposure, dissection, suctioning, retraction of delicate tissues and sutures, instrument exchange and for overall help during this procedure.   Discharge Exam: Blood pressure (!) 144/88, pulse 84, temperature 97.7 F (36.5 C), temperature source Oral, resp. rate (!) 22, height 5\' 10"  (1.778 m), weight 86.2 kg, SpO2 96 %. General appearance: alert, cooperative, and no distress Heart: regular rate and rhythm Lungs: clear to auscultation bilaterally Abdomen: soft, non-tender; bowel sounds normal; no masses,  no organomegaly Extremities: extremities normal, atraumatic, no cyanosis or edema Wound: clean and dry  Disposition: Discharge disposition: 01-Home or Self Care      Allergies as of 09/23/2022       Reactions   Covid-19 (mrna) Vaccine Swelling   Amoxicillin Itching   Prednisone Other (See Comments)   "Makes me crazy"        Medication List     TAKE these medications    acetaminophen 500 MG tablet Commonly known as: TYLENOL Take 1,000 mg by mouth every 6 (six) hours as needed for moderate pain.   alum & mag hydroxide-simeth 200-200-20 MG/5ML suspension Commonly known as: MAALOX/MYLANTA  Take 30 mLs by mouth every 6 (six) hours as needed for indigestion or heartburn.   Ciclopirox 1 % shampoo Apply 1 Application topically daily as needed (eczema on scalp).   cyanocobalamin 1000 MCG tablet Commonly known as: VITAMIN B12 Take 1,000 mcg by mouth daily.   diphenhydrAMINE 25 MG tablet Commonly known as: BENADRYL Take 25 mg by mouth daily as needed for itching.   doxycycline 20 MG tablet Commonly known as: PERIOSTAT Take 20-40 mg by mouth See admin instructions. Take 20 mg daily,  increase to 40 mg as needed for rosacea flare   ezetimibe 10 MG tablet Commonly known as: ZETIA Take 1 tablet (10 mg total) by mouth daily.   fluocinolone 0.01 % external solution Commonly known as: SYNALAR Apply 1 Application topically at bedtime as needed (eczema).   loratadine 10 MG tablet Commonly known as: CLARITIN Take 10 mg by mouth daily.   montelukast 10 MG tablet Commonly known as: SINGULAIR TAKE 1 TABLET BY MOUTH EVERYDAY AT BEDTIME   multivitamin capsule Take 1 capsule by mouth daily.   oxyCODONE 5 MG immediate release tablet Commonly known as: Oxy IR/ROXICODONE Take 1 tablet (5 mg total) by mouth every 4 (four) hours as needed for moderate pain.   pseudoephedrine 30 MG tablet Commonly known as: SUDAFED Take 30 mg by mouth every 4 (four) hours as needed for congestion.   SLOW MAGNESIUM/CALCIUM PO Take 1 tablet by mouth daily.   triamcinolone 55 MCG/ACT Aero nasal inhaler Commonly known as: NASACORT Place 2 sprays into the nose daily.   varenicline 1 MG tablet Commonly known as: Chantix Continuing Month Pak Take 1 tablet (1 mg total) by mouth 2 (two) times daily.   Vitamin D3 125 MCG (5000 UT) Caps Take 5,000 Units by mouth daily.        Follow-up Information     Lightfoot, Eliezer Lofts, MD Follow up.   Specialty: Cardiothoracic Surgery Contact information: [address] [place] [address] [phone]                 Signed: Elenore Rota 09/23/2022, 3:47 PM

## 2022-09-23 NOTE — Plan of Care (Signed)
Patient ID: Sharon [name], female   DOB: Sep 18, 1960, 62 y.o.   MRN: [id number]  Problem: Education: Goal: Knowledge of disease or condition will improve Outcome: Adequate for Discharge Goal: Knowledge of the prescribed therapeutic regimen will improve Outcome: Adequate for Discharge   Problem: Activity: Goal: Risk for activity intolerance will decrease Outcome: Adequate for Discharge   Problem: Cardiac: Goal: Will achieve and/or maintain hemodynamic stability Outcome: Adequate for Discharge   Problem: Clinical Measurements: Goal: Postoperative complications will be avoided or minimized Outcome: Adequate for Discharge   Problem: Respiratory: Goal: Respiratory status will improve Outcome: Adequate for Discharge   Problem: Pain Management: Goal: Pain level will decrease Outcome: Adequate for Discharge   Problem: Skin Integrity: Goal: Wound healing without signs and symptoms infection will improve Outcome: Adequate for Discharge   Problem: Education: Goal: Knowledge of General Education information will improve Description: Including pain rating scale, medication(s)/side effects and non-pharmacologic comfort measures Outcome: Adequate for Discharge   Problem: Health Behavior/Discharge Planning: Goal: Ability to manage health-related needs will improve Outcome: Adequate for Discharge   Problem: Clinical Measurements: Goal: Ability to maintain clinical measurements within normal limits will improve Outcome: Adequate for Discharge Goal: Will remain free from infection Outcome: Adequate for Discharge Goal: Diagnostic test results will improve Outcome: Adequate for Discharge Goal: Respiratory complications will improve Outcome: Adequate for Discharge Goal: Cardiovascular complication will be avoided Outcome: Adequate for Discharge   Problem: Activity: Goal: Risk for activity intolerance will decrease Outcome: Adequate for Discharge   Problem: Nutrition: Goal: Adequate  nutrition will be maintained Outcome: Adequate for Discharge   Problem: Coping: Goal: Level of anxiety will decrease Outcome: Adequate for Discharge   Problem: Elimination: Goal: Will not experience complications related to bowel motility Outcome: Adequate for Discharge Goal: Will not experience complications related to urinary retention Outcome: Adequate for Discharge   Problem: Pain Managment: Goal: General experience of comfort will improve Outcome: Adequate for Discharge   Problem: Safety: Goal: Ability to remain free from injury will improve Outcome: Adequate for Discharge   Problem: Skin Integrity: Goal: Risk for impaired skin integrity will decrease Outcome: Adequate for Discharge    Lidia Collum, RN

## 2022-09-23 NOTE — TOC Transition Note (Signed)
Transition of Care [facility]) - CM/SW Discharge Note   Patient Details  Name: Sharon [name] MRN: [id number] Date of Birth: 15-Sep-1960  Transition of Care [facility]) CM/SW Contact:  Kermit Balo, RN Phone Number: 09/23/2022, 3:51 PM   Clinical Narrative:    Pt is discharging home with self care. No needs per TOC.   Final next level of care: Home/Self Care Barriers to Discharge: No Barriers Identified   Patient Goals and CMS Choice      Discharge Placement                         Discharge Plan and Services Additional resources added to the After Visit Summary for                                       Social Determinants of Health (SDOH) Interventions SDOH Screenings   Housing: Patient Declined (09/22/2022)  Alcohol Screen: Low Risk  (08/25/2021)  Depression (PHQ2-9): Low Risk  (09/02/2022)  Tobacco Use: Medium Risk (09/22/2022)     Readmission Risk Interventions     No data to display

## 2022-09-23 NOTE — Progress Notes (Signed)
      [address]       [place] [address]             [phone]      2 Days Post-Op Procedure(s) (LRB): XI ROBOTIC ASSISTED THORACOSCOPY-RIGHT LOWER LOBECTOMY (Right) NODE DISSECTION (Right) INTERCOSTAL NERVE BLOCK (Right)  Subjective:  Patient continues to do well.  Continues to have minimal pain.  + ambulation   + BM  Objective: Vital signs in last 24 hours: Temp:  [97.7 F (36.5 C)-98.3 F (36.8 C)] 97.9 F (36.6 C) (05/15 0306) Pulse Rate:  [70-81] 80 (05/15 0605) Cardiac Rhythm: Normal sinus rhythm;Bundle branch block (05/14 1905) Resp:  [11-20] 20 (05/15 0605) BP: (120-159)/(63-85) 121/63 (05/15 0306) SpO2:  [91 %-96 %] 93 % (05/15 0605)  Intake/Output from previous day: 05/14 0701 - 05/15 0700 In: 840 [P.O.:840] Out: 250 [Chest Tube:250]  General appearance: alert, cooperative, and no distress Heart: regular rate and rhythm Lungs: clear to auscultation bilaterally Abdomen: soft, non-tender; bowel sounds normal; no masses,  no organomegaly Extremities: extremities normal, atraumatic, no cyanosis or edema Wound: clean and dry  Lab Results: Recent Labs    09/22/22 0020 09/23/22 0026  WBC 20.4* 13.7*  HGB 12.5 12.1  HCT 37.2 36.0  PLT 265 251   BMET:  Recent Labs    09/22/22 0020 09/23/22 0026  NA 131* 128*  K 3.9 4.0  CL 101 95*  CO2 23 25  GLUCOSE 124* 110*  BUN 10 11  CREATININE 0.63 0.69  CALCIUM 9.0 8.9    PT/INR: No results for input(s): "LABPROT", "INR" in the last 72 hours. ABG No results found for: "PHART", "HCO3", "TCO2", "ACIDBASEDEF", "O2SAT" CBG (last 3)  No results for input(s): "GLUCAP" in the last 72 hours.  Assessment/Plan: S/P Procedure(s) (LRB): XI ROBOTIC ASSISTED THORACOSCOPY-RIGHT LOWER LOBECTOMY (Right) NODE DISSECTION (Right) INTERCOSTAL NERVE BLOCK (Right)  CV- NSR, BP stable Pulm- CT on water seal, no air leak present.. slight increase in pneumothorax after clamping trial yesterday.. CXR  today shows persistent right pneumothorax Renal- creatinine, lytes are normal ID- leukocytosis, improved Lovenox for DVT prophylaxis Dispo- patient stable, no air leak, will discuss CT removal with Dr. Cliffton Asters, with possible d/c home today   LOS: 2 days    Lowella Dandy, PA-C 09/23/2022

## 2022-09-24 ENCOUNTER — Ambulatory Visit: Payer: Managed Care, Other (non HMO) | Admitting: Student in an Organized Health Care Education/Training Program

## 2022-09-24 ENCOUNTER — Telehealth: Payer: Self-pay

## 2022-09-24 NOTE — Telephone Encounter (Signed)
FMLA/STD form completed and faxed to the [facility] disability @ [phone] claim # [id number]. Beginning LOA 09/21/22 through 11/23/2022.

## 2022-10-02 ENCOUNTER — Ambulatory Visit (INDEPENDENT_AMBULATORY_CARE_PROVIDER_SITE_OTHER): Payer: Self-pay | Admitting: Thoracic Surgery (Cardiothoracic Vascular Surgery)

## 2022-10-02 ENCOUNTER — Encounter: Payer: Self-pay | Admitting: Thoracic Surgery (Cardiothoracic Vascular Surgery)

## 2022-10-02 VITALS — BP 163/96 | HR 70 | Resp 20 | Ht 70.0 in | Wt 195.0 lb

## 2022-10-02 DIAGNOSIS — C3431 Malignant neoplasm of lower lobe, right bronchus or lung: Secondary | ICD-10-CM

## 2022-10-02 MED ORDER — TRAMADOL HCL 50 MG PO TABS: 50.0000 mg | ORAL_TABLET | Freq: Four times a day (QID) | ORAL | 0 refills | Status: DC | PRN

## 2022-10-02 MED ORDER — PREGABALIN 25 MG PO CAPS: 25.0000 mg | ORAL_CAPSULE | Freq: Two times a day (BID) | ORAL | 0 refills | Status: DC

## 2022-10-02 NOTE — Progress Notes (Unsigned)
      [address]       [place] [address]             [phone]        Sharon [name] [facility] Medical Record #[id number] Date of Birth: 1961/03/05  Referring: Raechel Chute, MD Primary Care: Reubin Milan, MD Primary Cardiologist:None  Reason for visit:   follow-up  History of Present Illness:     62yo female presents for her 1 week follow-up appointment.  Overall doing well.  She does complain of some incisional pain.  Physical Exam: BP (!) 163/96 (BP Location: Right Arm, Patient Position: Sitting, Cuff Size: Normal)   Pulse 70   Resp 20   Ht 5\' 10"  (1.778 m)   Wt 195 lb (88.5 kg)   SpO2 98% Comment: RA  BMI 27.98 kg/m   Alert NAD Incision clean.   Abdomen, ND no peripheral edema   Diagnostic Studies & Laboratory data:  Path: FINAL MICROSCOPIC DIAGNOSIS:   A. LYMPH NODE, HILAR, EXCISION:  -  1 lymph node, negative for malignancy (0/1).   B. LYMPH NODE, HILAR #2, EXCISION:  -  1 lymph node, negative for malignancy (0/1).   C. LYMPH NODE, 7, EXCISION:  -  1 lymph node, negative for malignancy (0/1).   D. LYMPH NODE, 7 #2, EXCISION:  -  1 lymph node, negative for malignancy (0/1).   E. LUNG, RIGHT LOWER LOBE, LOBECTOMY:  -  Adenocarcinoma, predominantly acinar type with focal micropapillary  pattern (less than  5%) and focal mucinous features.  -  1.4 x 1.4 x 1.2 cm.  -  Margins negative.  -  Negative for lymphovascular and pleural invasion.  pT1b pN0; see synoptic report below     Assessment / Plan:   62yo female s/p R RATS, RLLectomy for T1bN0M0 adenocarcinoma.  She will be referred to medical oncology.  She will follow-up in 1 month with a CXR   Sharon [name] 10/06/2022 10:33 AM

## 2022-10-09 ENCOUNTER — Inpatient Hospital Stay (HOSPITAL_BASED_OUTPATIENT_CLINIC_OR_DEPARTMENT_OTHER): Payer: Managed Care, Other (non HMO) | Admitting: Internal Medicine

## 2022-10-09 ENCOUNTER — Encounter: Payer: Self-pay | Admitting: Internal Medicine

## 2022-10-09 VITALS — BP 141/79 | HR 72 | Temp 98.6°F | Wt 192.0 lb

## 2022-10-09 DIAGNOSIS — C3431 Malignant neoplasm of lower lobe, right bronchus or lung: Secondary | ICD-10-CM

## 2022-10-09 DIAGNOSIS — Z8041 Family history of malignant neoplasm of ovary: Secondary | ICD-10-CM | POA: Diagnosis not present

## 2022-10-09 DIAGNOSIS — Z803 Family history of malignant neoplasm of breast: Secondary | ICD-10-CM | POA: Diagnosis not present

## 2022-10-09 DIAGNOSIS — Z87891 Personal history of nicotine dependence: Secondary | ICD-10-CM | POA: Diagnosis not present

## 2022-10-09 DIAGNOSIS — Z79899 Other long term (current) drug therapy: Secondary | ICD-10-CM | POA: Diagnosis not present

## 2022-10-09 DIAGNOSIS — Z902 Acquired absence of lung [part of]: Secondary | ICD-10-CM | POA: Diagnosis not present

## 2022-10-09 DIAGNOSIS — Z800 Family history of malignant neoplasm of digestive organs: Secondary | ICD-10-CM | POA: Diagnosis not present

## 2022-10-09 NOTE — Progress Notes (Signed)
[facility] CONSULT NOTE  Patient Care Team: Reubin Milan, MD as PCP - General (Internal Medicine) Jones Broom, MD as Referring Physician (Dermatology) Luella Cook Deloris Ping, MD (Obstetrics and Gynecology) Vernie Murders, MD (Otolaryngology) Glory Buff, RN as Oncology Nurse Navigator  CHIEF COMPLAINTS/PURPOSE OF CONSULTATION: lung cancer  #  Oncology History Overview Note  FEB 2024- PET scan:1. The posterior right lower lobe pulmonary nodule of concern on recent lung cancer screening chest CT shows no hypermetabolism on PET imaging today. While reassuring, low grade or well differentiated neoplasm can be poorly FDG avid.   APRIL 2024-  [Dr.Dgyali]LUNG, RLL, FINE NEEDLE ASPIRATION:  - Adenocarcinoma  - See comment   B. LUNG, RLL, BRUSHING:  - Adenocarcinoma  - See comment   COMMENT:  Sufficient tissue for molecular testing is present.  Dr. Venetia Night  reviewed the case and agrees with the above diagnosis.   LUNG: Resection  Synchronous Tumors: N/A Total Number of Primary Tumors: 1 Procedure: Lobectomy Specimen Laterality: Right Tumor Focality: Unifocal Tumor Site: Right lower lobe Tumor Size: 1.4 x 1.4 x 1.2 cm      Total Tumor Size: Same as above      Invasive Tumor Size (applies only to invasive nonmucinous adenocarcinoma with a lepidic           component): Same as above Histologic Type: Adenocarcinoma, predominantly acinar type with focal mucinous features and rare micropapillary features (less than 5%). Visceral Pleura Invasion: Not identified Direct Invasion of Adjacent Structures: N/A Lymphovascular Invasion: Not identified Margins: All margins negative      Closest Margin(s) to Invasive Carcinoma: Bronchial at 4 cm margin(s) Involved by Invasive Carcinoma: N/A      Margin Status for Non-Invasive Tumor: N/A Treatment Effect: No known presurgical therapy      Percentage of Residual Viable Tumor: N/A Regional Lymph Nodes:       Number of Lymph Nodes Involved: 0                           Nodal Sites with Tumor: 0      Number of Lymph Nodes Examined: 4                      Nodal Sites Examined: 2 (hilar and station 7) Distant Metastasis:      Distant Site(s) Involved: N/A Pathologic Stage Classification (pTNM, AJCC 8th Edition): pT1b, pN0 Ancillary Studies: Can be performed on clinician request Representative Tumor Block: E1 Comment(s): None ([id number])    Primary cancer of right lower lobe of lung (HCC)  08/28/2022 Initial Diagnosis   Primary cancer of right lower lobe of lung   08/28/2022 Cancer Staging   Staging form: Lung, AJCC 8th Edition - Clinical: Stage IA2 (cT1b, cN0, cM0) - Signed by Earna Coder, MD on 08/28/2022      HISTORY OF PRESENTING ILLNESS: Patient ambulating-independently.  Alone.  Sharon [name] 62 y.o.  female history of smoking-currently quit with right lower lobe stage I lung cancer is here for follow-up.  Patient underwent right lower lobectomy robotic in May 2024 in [place].  Recovery fairly uneventful. Otherwise denies any unusual shortness of breath or cough.  Denies any fevers or chills.  No nausea no vomiting.  Review of Systems  Constitutional:  Negative for chills, diaphoresis, fever, malaise/fatigue and weight loss.  HENT:  Negative for nosebleeds and sore throat.   Eyes:  Negative for double vision.  Respiratory:  Negative for cough, hemoptysis, sputum production, shortness of breath and wheezing.   Cardiovascular:  Negative for chest pain, palpitations, orthopnea and leg swelling.  Gastrointestinal:  Negative for abdominal pain, blood in stool, constipation, diarrhea, heartburn, melena, nausea and vomiting.  Genitourinary:  Negative for dysuria, frequency and urgency.  Musculoskeletal:  Negative for back pain and joint pain.  Skin: Negative.  Negative for itching and rash.  Neurological:  Negative for dizziness, tingling, focal weakness, weakness and  headaches.  Endo/Heme/Allergies:  Does not bruise/bleed easily.  Psychiatric/Behavioral:  Negative for depression. The patient is not nervous/anxious and does not have insomnia.      MEDICAL HISTORY:  Past Medical History:  Diagnosis Date   Benign neoplasm of ascending colon    BRCA negative 09/2016   My Risk neg   Disease of nail 03/11/2015   Emphysema lung (HCC)    Encounter for nonprocreative genetic counseling    Family history of breast cancer    and pancreatic   Floppy mitral valve    told this "long ago".  No issues   Headache    sinus, seasonal   Increased risk of breast cancer 09/2016   IBIS=17.0%/riskscore=22.4%   Numbness in feet    s/p foot and back surgery   Polyp of sigmoid colon    Polyp of transverse colon    PONV (postoperative nausea and vomiting)    Rosacea    Special screening for malignant neoplasms, colon    Tendinitis of wrist 03/11/2015   Tinnitus 12/2020    SURGICAL HISTORY: Past Surgical History:  Procedure Laterality Date   BRONCHIAL BIOPSY  08/21/2022   Procedure: BRONCHIAL BIOPSIES;  Surgeon: Raechel Chute, MD;  Location: [facility] ENDOSCOPY;  Service: Pulmonary;;   BRONCHIAL BRUSHINGS  08/21/2022   Procedure: BRONCHIAL BRUSHINGS;  Surgeon: Raechel Chute, MD;  [facility] ENDOSCOPY;  Service: Pulmonary;;   BRONCHIAL NEEDLE ASPIRATION BIOPSY  08/21/2022   Procedure: BRONCHIAL NEEDLE ASPIRATION BIOPSIES;  Surgeon: Raechel Chute, MD;  Location: [facility] ENDOSCOPY;  Service: Pulmonary;;   BRONCHIAL WASHINGS  08/21/2022   Procedure: BRONCHIAL WASHINGS;  Surgeon: Raechel Chute, MD;  Location: [facility] ENDOSCOPY;  Service: Pulmonary;;   BUNIONECTOMY Bilateral    CESAREAN SECTION  1987   COLONOSCOPY WITH PROPOFOL N/A 03/12/2016   Procedure: COLONOSCOPY WITH PROPOFOL;  Surgeon: Midge Minium, MD;  Location: [facility];  Service: Endoscopy;  Laterality: N/A;   COLONOSCOPY WITH PROPOFOL N/A 06/05/2019   Procedure: COLONOSCOPY WITH BIOPSIES;  Surgeon: Midge Minium, MD;  Location: [facility];  Service: Endoscopy;  Laterality: N/A;   INTERCOSTAL NERVE BLOCK Right 09/21/2022   Procedure: INTERCOSTAL NERVE BLOCK;  Surgeon: Corliss Skains, MD;  Location: [facility] OR;  Service: Thoracic;  Laterality: Right;   LUMBAR DISC SURGERY     NODE DISSECTION Right 09/21/2022   Procedure: NODE DISSECTION;  Surgeon: Corliss Skains, MD;  Location: [facility] OR;  Service: Thoracic;  Laterality: Right;   POLYPECTOMY  03/12/2016   Procedure: POLYPECTOMY;  Surgeon: Midge Minium, MD;  Location: [facility];  Service: Endoscopy;;   POLYPECTOMY N/A 06/05/2019   Procedure: POLYPECTOMY;  Surgeon: Midge Minium, MD;  Location: [facility];  Service: Endoscopy;  Laterality: N/A;   VIDEO BRONCHOSCOPY WITH ENDOBRONCHIAL ULTRASOUND Right 08/21/2022   Procedure: VIDEO BRONCHOSCOPY WITH ENDOBRONCHIAL ULTRASOUND;  Surgeon: Raechel Chute, MD;  Location: [facility] ENDOSCOPY;  Service: Pulmonary;  Laterality: Right;    SOCIAL HISTORY: Social History   Socioeconomic History   Marital status: Married  Spouse name: Rocky Link   Number of children: Not on file   Years of education: Not on file   Highest education level: Not on file  Occupational History   Not on file  Tobacco Use   Smoking status: Former    Packs/day: 1.00    Years: 45.00    Additional pack years: 0.00    Total pack years: 45.00    Types: Cigarettes    Quit date: 09/04/2022    Years since quitting: 0.0   Smokeless tobacco: Never  Vaping Use   Vaping Use: Never used  Substance and Sexual Activity   Alcohol use: Yes    Alcohol/week: 6.0 standard drinks of alcohol    Types: 4 Glasses of wine, 2 Standard drinks or equivalent per week   Drug use: No   Sexual activity: Yes  Other Topics Concern   Not on file  Social History Narrative   Not on file   Social Determinants of Health   Financial Resource Strain: Not on file  Food Insecurity: Not on file  Transportation Needs: Not on file   Physical Activity: Not on file  Stress: Not on file  Social Connections: Not on file  Intimate Partner Violence: Not on file    FAMILY HISTORY: Family History  Problem Relation Age of Onset   COPD Mother    Pulmonary fibrosis Mother    Heart attack Father    Heart disease Father    Pancreatic cancer Maternal Uncle 87   Breast cancer Maternal Grandmother 60       twice, bilat breast--88   Breast cancer Other    Breast cancer Other    Ovarian cancer Other     ALLERGIES:  is allergic to covid-19 (mrna) vaccine, amoxicillin, and prednisone.  MEDICATIONS:  Current Outpatient Medications  Medication Sig Dispense Refill   acetaminophen (TYLENOL) 500 MG tablet Take 1,000 mg by mouth every 6 (six) hours as needed for moderate pain.     alum & mag hydroxide-simeth (MAALOX/MYLANTA) 200-200-20 MG/5ML suspension Take 30 mLs by mouth every 6 (six) hours as needed for indigestion or heartburn.     Cholecalciferol (VITAMIN D3) 125 MCG (5000 UT) capsule Take 5,000 Units by mouth daily.     Ciclopirox 1 % shampoo Apply 1 Application topically daily as needed (eczema on scalp).     cyanocobalamin (VITAMIN B12) 1000 MCG tablet Take 1,000 mcg by mouth daily.     diphenhydrAMINE (BENADRYL) 25 MG tablet Take 25 mg by mouth daily as needed for itching.     doxycycline (PERIOSTAT) 20 MG tablet Take 20-40 mg by mouth See admin instructions. Take 20 mg daily, increase to 40 mg as needed for rosacea flare     fluocinolone (SYNALAR) 0.01 % external solution Apply 1 Application topically at bedtime as needed (eczema).     loratadine (CLARITIN) 10 MG tablet Take 10 mg by mouth daily.     Magnesium Cl-Calcium Carbonate (SLOW MAGNESIUM/CALCIUM PO) Take 1 tablet by mouth daily.     montelukast (SINGULAIR) 10 MG tablet TAKE 1 TABLET BY MOUTH EVERYDAY AT BEDTIME 90 tablet 3   Multiple Vitamin (MULTIVITAMIN) capsule Take 1 capsule by mouth daily.     oxyCODONE (OXY IR/ROXICODONE) 5 MG immediate release tablet  Take 1 tablet (5 mg total) by mouth every 4 (four) hours as needed for moderate pain. 30 tablet 0   pregabalin (LYRICA) 25 MG capsule Take 1 capsule (25 mg total) by mouth 2 (two) times daily. 60 capsule 0  pseudoephedrine (SUDAFED) 30 MG tablet Take 30 mg by mouth every 4 (four) hours as needed for congestion.     traMADol (ULTRAM) 50 MG tablet Take 1 tablet (50 mg total) by mouth every 6 (six) hours as needed. 40 tablet 0   triamcinolone (NASACORT) 55 MCG/ACT AERO nasal inhaler Place 2 sprays into the nose daily.     No current facility-administered medications for this visit.      PHYSICAL EXAMINATION:   Vitals:   10/09/22 1321 10/09/22 1324  BP: (!) 150/67 (!) 141/79  Pulse: 72   Temp: 98.6 F (37 C)   SpO2: 98%     Filed Weights   10/09/22 1321  Weight: 192 lb (87.1 kg)     Physical Exam Vitals and nursing note reviewed.  HENT:     Head: Normocephalic and atraumatic.     Mouth/Throat:     Pharynx: Oropharynx is clear.  Eyes:     Extraocular Movements: Extraocular movements intact.     Pupils: Pupils are equal, round, and reactive to light.  Cardiovascular:     Rate and Rhythm: Normal rate and regular rhythm.  Pulmonary:     Comments: Decreased breath sounds bilaterally.  Abdominal:     Palpations: Abdomen is soft.  Musculoskeletal:        General: Normal range of motion.     Cervical back: Normal range of motion.  Skin:    General: Skin is warm.  Neurological:     General: No focal deficit present.     Mental Status: She is alert and oriented to person, place, and time.  Psychiatric:        Behavior: Behavior normal.        Judgment: Judgment normal.      LABORATORY DATA:  I have reviewed the data as listed Lab Results  Component Value Date   WBC 13.7 (H) 09/23/2022   HGB 12.1 09/23/2022   HCT 36.0 09/23/2022   MCV 95.2 09/23/2022   PLT 251 09/23/2022   Recent Labs    04/20/22 0953 08/21/22 0604 09/17/22 1500 09/22/22 0020 09/23/22 0026   NA 144   < > 139 131* 128*  K 4.4   < > 3.8 3.9 4.0  CL 102   < > 107 101 95*  CO2 26   < > 26 23 25   GLUCOSE 100*   < > 95 124* 110*  BUN 9   < > 10 10 11   CREATININE 0.66   < > 0.72 0.63 0.69  CALCIUM 9.8   < > 9.2 9.0 8.9  GFRNONAA  --    < > >60 >60 >60  PROT 6.0  --  6.2*  --  5.3*  ALBUMIN 4.1  --  3.8  --  3.2*  AST 12  --  15  --  91*  ALT 11  --  15  --  81*  ALKPHOS 115  --  103  --  96  BILITOT 0.3  --  0.6  --  0.9   < > = values in this interval not displayed.    RADIOGRAPHIC STUDIES: I have personally reviewed the radiological images as listed and agreed with the findings in the report. DG Chest Port 1 View  Result Date: 09/23/2022 CLINICAL DATA:  Pneumothorax EXAM: PORTABLE CHEST 1 VIEW COMPARISON:  Same day chest radiograph FINDINGS: No pleural effusion. There is a persistent right apical pneumothorax after removal of the right-sided thoracostomy tube. No radiographically apparent displaced  rib fractures bibasilar atelectasis. Low lung volumes. Normal cardiac and mediastinal contours. Visualized upper abdomen is unremarkable. IMPRESSION: Unchanged right apical pneumothorax after removal of thoracostomy tube. Electronically Signed   By: Lorenza Cambridge M.D.   On: 09/23/2022 15:28   DG CHEST PORT 1 VIEW  Result Date: 09/23/2022 CLINICAL DATA:  Status post right lobectomy EXAM: PORTABLE CHEST 1 VIEW COMPARISON:  09/22/2022 FINDINGS: Cardiac shadow is stable. Right chest tube is again seen with small apical pneumothorax. The overall appearance is stable from the prior exam. Left lung remains clear. No bony abnormality is noted. IMPRESSION: Right chest tube in place with small apical pneumothorax. Electronically Signed   By: Alcide Clever M.D.   On: 09/23/2022 10:48   DG Chest 1V REPEAT Same Day  Result Date: 09/22/2022 CLINICAL DATA:  Pneumothorax EXAM: CHEST - 1 VIEW SAME DAY COMPARISON:  Earlier 09/22/2022 FINDINGS: Right-sided chest tube in place with a right-sided  pneumothorax, increased from previous. Underinflation. Mild left basilar atelectasis. Normal cardiopericardial silhouette without edema. Overlapping cardiac leads. IMPRESSION: Increasing right pneumothorax with component of the apex and at the medial right lung base. Chest tube is in place. Underinflation. Electronically Signed   By: Karen Kays M.D.   On: 09/22/2022 15:54   DG Chest Port 1 View  Result Date: 09/22/2022 CLINICAL DATA:  Status post lobectomy of right lung. EXAM: PORTABLE CHEST 1 VIEW COMPARISON:  09/21/2022 FINDINGS: There is a right-sided chest tube which is stable in position from the previous exam. No significant pneumothorax identified. Lung volumes are low. No pleural effusion, interstitial edema or airspace disease. IMPRESSION: 1. Stable right chest tube. No significant pneumothorax. 2. Low lung volumes. Electronically Signed   By: Signa Kell M.D.   On: 09/22/2022 08:14   DG Chest Port 1 View  Result Date: 09/21/2022 CLINICAL DATA:  Status post lobectomy of lung. EXAM: PORTABLE CHEST 1 VIEW COMPARISON:  09/21/2022. FINDINGS: 1108 hours. Postoperative changes of right-sided lobectomy with right apically directed chest tube and small amount of subcutaneous emphysema along the right chest wall. Possible small right basilar pneumothorax. No consolidation or pulmonary edema. Stable cardiac and mediastinal contours. IMPRESSION: Postoperative changes of right-sided lobectomy with right apically directed chest tube and possible small right basilar pneumothorax. Electronically Signed   By: Orvan Falconer M.D.   On: 09/21/2022 15:18   DG Chest 2 View  Result Date: 09/21/2022 CLINICAL DATA:  Preop chest radiograph. Right lower lobe lung lesion. EXAM: CHEST - 2 VIEW COMPARISON:  CT 08/17/2022 FINDINGS: Heart size is normal. No pleural effusion or edema. No airspace opacities identified. Left lower lobe lung lesion is better seen on the chest CT from 08/17/2022. Visualized osseous  structures are unremarkable. IMPRESSION: No active cardiopulmonary disease. Electronically Signed   By: Signa Kell M.D.   On: 09/21/2022 10:18   Myocardial Perfusion Imaging  Result Date: 09/14/2022   The study is normal. The study is low risk.   No ST deviation was noted.   Left ventricular function is normal. Nuclear stress EF: 59 %. The left ventricular ejection fraction is normal (55-65%). End diastolic cavity size is normal. End systolic cavity size is normal.   Prior study not available for comparison. Negative stress test, low risk study.    ASSESSMENT & PLAN:   Primary cancer of right lower lobe of lung (HCC) # Adenocarcinoma right lower lobe-clinical stage I [s/p Bronch- Dr.Dgyali]. MAY 13th, 2024- s/p robotic right lobectomy-  pT1b, pN0. Marland Kitchen.E. LUNG, RIGHT LOWER LOBE, LOBECTOMY: -  Adenocarcinoma, predominantly acinar type with focal micropapillary pattern (less than  5%) and focal mucinous features. -  1.4 x 1.4 x 1.2 cm. -  Margins negative.-  Negative for lymphovascular and pleural invasion.  pT1b pN0.  # No high risk features noted.  No role for any adjuvant therapy or adjuvant radiation.  Discussed with the patient that she high chance of cure.  However recommend surveillance imaging with CT scan every 6 months for the first 3 years and then follow-up imaging on an annual basis.  # Smoking: Congratulated the patient on quitting smoking-April 2024.  # Genetics counseling: Family history of malignancies.  Patient states that she had possible testing 4-5 years ago; recommend sending [place] the results.   # DISPOSITION: # Follow up in 6 months- MD; labs- cbc/cmp; CT chest prior-  Dr.B    All questions were answered. The patient knows to call the clinic with any problems, questions or concerns.    Earna Coder, MD 10/09/2022 1:56 PM

## 2022-10-09 NOTE — Progress Notes (Signed)
Patient states that she is feeling good, it is the surgical site if still a little sore. She rates her pain at about a 2 or 3.

## 2022-10-09 NOTE — Assessment & Plan Note (Addendum)
#   Adenocarcinoma right lower lobe-clinical stage I [s/p Bronch- Dr.Dgyali]. MAY 13th, 2024- s/p robotic right lobectomy-  pT1b, pN0. Sharon [name] Kitchen.E. LUNG, RIGHT LOWER LOBE, LOBECTOMY: -  Adenocarcinoma, predominantly acinar type with focal micropapillary pattern (less than  5%) and focal mucinous features. -  1.4 x 1.4 x 1.2 cm. -  Margins negative.-  Negative for lymphovascular and pleural invasion.  pT1b pN0.  # No high risk features noted.  No role for any adjuvant therapy or adjuvant radiation.  Discussed with the patient that she high chance of cure.  However recommend surveillance imaging with CT scan every 6 months for the first 3 years and then follow-up imaging on an annual basis.  # Smoking: Congratulated the patient on quitting smoking-April 2024.  # Genetics counseling: Family history of malignancies.  Patient states that she had possible testing 4-5 years ago; recommend sending [place] the results.   # DISPOSITION: # Follow up in 6 months- MD; labs- cbc/cmp; CT chest prior-  Dr.B

## 2022-10-10 HISTORY — PX: LUNG LOBECTOMY: SHX167

## 2022-10-12 ENCOUNTER — Encounter: Payer: Self-pay | Admitting: Student in an Organized Health Care Education/Training Program

## 2022-10-12 ENCOUNTER — Ambulatory Visit: Payer: Managed Care, Other (non HMO) | Admitting: Student in an Organized Health Care Education/Training Program

## 2022-10-12 ENCOUNTER — Telehealth: Payer: Self-pay | Admitting: Student in an Organized Health Care Education/Training Program

## 2022-10-12 VITALS — BP 130/72 | HR 76 | Temp 98.1°F | Ht 70.0 in | Wt 190.4 lb

## 2022-10-12 DIAGNOSIS — C3491 Malignant neoplasm of unspecified part of right bronchus or lung: Secondary | ICD-10-CM

## 2022-10-12 DIAGNOSIS — M792 Neuralgia and neuritis, unspecified: Secondary | ICD-10-CM | POA: Diagnosis not present

## 2022-10-12 MED ORDER — LIDOCAINE 5 % EX OINT: 1.0000 | TOPICAL_OINTMENT | Freq: Three times a day (TID) | CUTANEOUS | 1 refills | Status: DC | PRN

## 2022-10-12 MED ORDER — LIDOCAINE VISCOUS HCL 2 % MT SOLN
15.0000 mL | Freq: Three times a day (TID) | OROMUCOSAL | 2 refills | Status: DC | PRN
Start: 2022-10-12 — End: 2022-10-12

## 2022-10-12 NOTE — Telephone Encounter (Signed)
I spoke with the patient. The Lidocaine that was sent in is an oral solution, she is needing the cream/ointment for her rib pain. Can you send in the cream/ointment?

## 2022-10-12 NOTE — Progress Notes (Signed)
Synopsis: Referred in for pulmonary nodule by Reubin Milan, MD  Assessment & Plan:   #Non-small cell cancer of right lung  Patient presented for a RLL lung nodule noted on low dose CT for lung cancer screening. She underwent robotic assisted navigational bronchoscopy with cytology showing adenocarcinoma. 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 She was also seen by oncology and will undergo radiographic surveillance, with no role for adjuvant chemotherapy given low risk features. PFT's were acceptable with no suggestion of COPD.  -CT chest surveillance per oncology  #Neuropathic pain  Reports neuropathic pain at the incision site. Recommended tiger balm ointment, and will also prescribe lidocaine jelly.  - lidocaine (XYLOCAINE) 2 % solution; Use as directed 15 mLs in the mouth or throat every 8 (eight) hours as needed for mouth pain.  Dispense: 100 mL; Refill: 2  Return in about 1 year (around 10/12/2023).  I spent 30 minutes caring for this patient today, including preparing to see the patient, obtaining a medical history , reviewing a separately obtained history, performing a medically appropriate examination and/or evaluation, counseling and educating the patient/family/caregiver, ordering medications, tests, or procedures, documenting clinical information in the electronic health record, and independently interpreting results (not separately reported/billed) and communicating results to the patient/family/caregiver  Raechel Chute, MD [facility] 10/12/2022 2:44 PM    End of visit medications:  Meds ordered this encounter  Medications   lidocaine (XYLOCAINE) 2 % solution    Sig: Use as directed 15 mLs in the mouth or throat every 8 (eight) hours as needed for mouth pain.    Dispense:  100 mL    Refill:  2     Current  Outpatient Medications:    acetaminophen (TYLENOL) 500 MG tablet, Take 1,000 mg by mouth every 6 (six) hours as needed for moderate pain., Disp: , Rfl:    alum & mag hydroxide-simeth (MAALOX/MYLANTA) 200-200-20 MG/5ML suspension, Take 30 mLs by mouth every 6 (six) hours as needed for indigestion or heartburn., Disp: , Rfl:    Cholecalciferol (VITAMIN D3) 125 MCG (5000 UT) capsule, Take 5,000 Units by mouth daily., Disp: , Rfl:    Ciclopirox 1 % shampoo, Apply 1 Application topically daily as needed (eczema on scalp)., Disp: , Rfl:    cyanocobalamin (VITAMIN B12) 1000 MCG tablet, Take 1,000 mcg by mouth daily., Disp: , Rfl:    diphenhydrAMINE (BENADRYL) 25 MG tablet, Take 25 mg by mouth daily as needed for itching., Disp: , Rfl:    doxycycline (PERIOSTAT) 20 MG tablet, Take 20-40 mg by mouth See admin instructions. Take 20 mg daily, increase to 40 mg as needed for rosacea flare, Disp: , Rfl:    fluocinolone (SYNALAR) 0.01 % external solution, Apply 1 Application topically at bedtime as needed (eczema)., Disp: , Rfl:    lidocaine (XYLOCAINE) 2 % solution, Use as directed 15 mLs in the mouth or throat every 8 (eight) hours as needed for mouth pain., Disp: 100 mL, Rfl: 2   loratadine (CLARITIN) 10 MG tablet, Take 10 mg by mouth daily., Disp: , Rfl:    Magnesium Cl-Calcium Carbonate (SLOW MAGNESIUM/CALCIUM PO), Take 1 tablet by mouth daily., Disp: , Rfl:    montelukast (SINGULAIR) 10 MG tablet, TAKE 1 TABLET BY MOUTH EVERYDAY AT BEDTIME, Disp: 90 tablet, Rfl: 3   Multiple Vitamin (MULTIVITAMIN) capsule, Take 1 capsule by mouth daily., Disp: , Rfl:  oxyCODONE (OXY IR/ROXICODONE) 5 MG immediate release tablet, Take 1 tablet (5 mg total) by mouth every 4 (four) hours as needed for moderate pain., Disp: 30 tablet, Rfl: 0   pregabalin (LYRICA) 25 MG capsule, Take 1 capsule (25 mg total) by mouth 2 (two) times daily., Disp: 60 capsule, Rfl: 0   pseudoephedrine (SUDAFED) 30 MG tablet, Take 30 mg by mouth every  4 (four) hours as needed for congestion., Disp: , Rfl:    traMADol (ULTRAM) 50 MG tablet, Take 1 tablet (50 mg total) by mouth every 6 (six) hours as needed., Disp: 40 tablet, Rfl: 0   triamcinolone (NASACORT) 55 MCG/ACT AERO nasal inhaler, Place 2 sprays into the nose daily., Disp: , Rfl:    Subjective:   PATIENT ID: Sharon [name] GENDER: female DOB: October 22, 1960, MRN: [id number]  Chief Complaint  Patient presents with   Follow-up    Occ dry cough.     HPI  Sharon [name] is a pleasant 62 year old female with no significant past medical history who presented to clinic for a lung nodule, now s/p RLL lobectomy following biopsy proved adenocarcinoma in the nodule.  She's felt well after the surgery and is back to her baseline. She does report continued incision site tenderness and neuropathic pain over the right flank. Otherwise denies chest pain, chest tightness, cough, or shortness of breath.    She was enrolled in the lung cancer screening program and has had a recent CT scan that showed a RLL nodule to have grown from 10 mm to 15 mm. This was followed with a PET/CT that did not show the nodule to be PET avid. She then underwent robotic assisted navigational bronchoscopy on 08/21/2022 with FNA showing adenocarcinoma. She underwent RATS lobectomy on 09/21/2022.   Patient is a lifelong smoker with at least 45 pack years of smoking history. She used to smoke 1.5 ppd and is now down to 1, with plans of cutting further and quitting.   CT Chest 09/2018: Persisting sub solid nodule of the right lower lobe, greater than 6 Mm CT Chest 03/2019: 1. 5 x 10 mm irregular parenchymal lesion in the right lower lobe is stable since 09/21/2018. CT Chest 03/2020:  Interval stability of indistinct subsolid right lower lobe 1.2 cm pulmonary nodule CT Chest 06/2021:  Scattered solid pulmonary nodules are unchanged in size when compared with prior CT including an irregular solid nodule of the right lower lobe measuring  10.1 mm in mean diameter CT Chest 06/2022: 15.1 mm posterior right lower lobe nodule, enlarged from 10.1 mm on 06/11/2021. Lung-RADS 4B, suspicious PET/CT 06/2022: The posterior right lower lobe pulmonary nodule of concern on recent lung cancer screening chest CT shows no hypermetabolism on PET imaging  Ancillary information including prior medications, full medical/surgical/family/social histories, and PFTs (when available) are listed below and have been reviewed.   Review of Systems  Constitutional:  Negative for chills, fever and weight loss.  Respiratory:  Negative for cough, hemoptysis, sputum production, shortness of breath and wheezing.   Cardiovascular:  Negative for chest pain.  Skin:  Negative for rash.     Objective:   Vitals:   10/12/22 1352  BP: 130/72  Pulse: 76  Temp: 98.1 F (36.7 C)  TempSrc: Temporal  SpO2: 98%  Weight: 190 lb 6.4 oz (86.4 kg)  Height: 5\' 10"  (1.778 m)   98% on RA  BMI Readings from Last 3 Encounters:  10/12/22 27.32 kg/m  10/09/22 27.55 kg/m  10/02/22 27.98 kg/m  Wt Readings from Last 3 Encounters:  10/12/22 190 lb 6.4 oz (86.4 kg)  10/09/22 192 lb (87.1 kg)  10/02/22 195 lb (88.5 kg)    Physical Exam Constitutional:      Appearance: Normal appearance. She is not ill-appearing.  HENT:     Head: Normocephalic.     Mouth/Throat:     Mouth: Mucous membranes are moist.  Cardiovascular:     Rate and Rhythm: Normal rate and regular rhythm.     Pulses: Normal pulses.     Heart sounds: Normal heart sounds.  Pulmonary:     Effort: Pulmonary effort is normal. No respiratory distress.     Breath sounds: Normal breath sounds. No wheezing, rhonchi or rales.     Comments: Incision site well healed Musculoskeletal:     Right lower leg: No edema.     Left lower leg: No edema.  Neurological:     General: No focal deficit present.     Mental Status: She is alert and oriented to person, place, and time. Mental status is at baseline.        Ancillary Information    Past Medical History:  Diagnosis Date   Benign neoplasm of ascending colon    BRCA negative 09/2016   My Risk neg   Disease of nail 03/11/2015   Emphysema lung (HCC)    Encounter for nonprocreative genetic counseling    Family history of breast cancer    and pancreatic   Floppy mitral valve    told this "long ago".  No issues   Headache    sinus, seasonal   Increased risk of breast cancer 09/2016   IBIS=17.0%/riskscore=22.4%   Numbness in feet    s/p foot and back surgery   Polyp of sigmoid colon    Polyp of transverse colon    PONV (postoperative nausea and vomiting)    Rosacea    Special screening for malignant neoplasms, colon    Tendinitis of wrist 03/11/2015   Tinnitus 12/2020     Family History  Problem Relation Age of Onset   COPD Mother    Pulmonary fibrosis Mother    Heart attack Father    Heart disease Father    Pancreatic cancer Maternal Uncle 58   Breast cancer Maternal Grandmother 60       twice, bilat breast--88   Breast cancer Other    Breast cancer Other    Ovarian cancer Other      Past Surgical History:  Procedure Laterality Date   BRONCHIAL BIOPSY  08/21/2022   Procedure: BRONCHIAL BIOPSIES;  Surgeon: Raechel Chute, MD;  Location: [facility] ENDOSCOPY;  Service: Pulmonary;;   BRONCHIAL BRUSHINGS  08/21/2022   Procedure: BRONCHIAL BRUSHINGS;  Surgeon: Raechel Chute, MD;  Location: [facility] ENDOSCOPY;  Service: Pulmonary;;   BRONCHIAL NEEDLE ASPIRATION BIOPSY  08/21/2022   Procedure: BRONCHIAL NEEDLE ASPIRATION BIOPSIES;  Surgeon: Raechel Chute, MD;  Location: [facility] ENDOSCOPY;  Service: Pulmonary;;   BRONCHIAL WASHINGS  08/21/2022   Procedure: BRONCHIAL WASHINGS;  Surgeon: Raechel Chute, MD;  Location: [facility] ENDOSCOPY;  Service: Pulmonary;;   BUNIONECTOMY Bilateral    CESAREAN SECTION  1987   COLONOSCOPY WITH PROPOFOL N/A 03/12/2016   Procedure: COLONOSCOPY WITH PROPOFOL;  Surgeon: Midge Minium, MD;  Location: [facility] SURGERY  CNTR;  Service: Endoscopy;  Laterality: N/A;   COLONOSCOPY WITH PROPOFOL N/A 06/05/2019   Procedure: COLONOSCOPY WITH BIOPSIES;  Surgeon: Midge Minium, MD;  Location: [facility];  Service: Endoscopy;  Laterality: N/A;   INTERCOSTAL  NERVE BLOCK Right 09/21/2022   Procedure: INTERCOSTAL NERVE BLOCK;  Surgeon: Corliss Skains, MD;  Location: [facility] OR;  Service: Thoracic;  Laterality: Right;   LUMBAR DISC SURGERY     NODE DISSECTION Right 09/21/2022   Procedure: NODE DISSECTION;  Surgeon: Corliss Skains, MD;  Location: [facility] OR;  Service: Thoracic;  Laterality: Right;   POLYPECTOMY  03/12/2016   Procedure: POLYPECTOMY;  Surgeon: Midge Minium, MD;  Location: [facility] SURGERY CNTR;  Service: Endoscopy;;   POLYPECTOMY N/A 06/05/2019   Procedure: POLYPECTOMY;  Surgeon: Midge Minium, MD;  Location: [facility] SURGERY CNTR;  Service: Endoscopy;  Laterality: N/A;   VIDEO BRONCHOSCOPY WITH ENDOBRONCHIAL ULTRASOUND Right 08/21/2022   Procedure: VIDEO BRONCHOSCOPY WITH ENDOBRONCHIAL ULTRASOUND;  Surgeon: Raechel Chute, MD;  Location: [facility] ENDOSCOPY;  Service: Pulmonary;  Laterality: Right;    Social History   Socioeconomic History   Marital status: Married    Spouse name: Rocky Link   Number of children: Not on file   Years of education: Not on file   Highest education level: Not on file  Occupational History   Not on file  Tobacco Use   Smoking status: Former    Packs/day: 1.00    Years: 45.00    Additional pack years: 0.00    Total pack years: 45.00    Types: Cigarettes    Quit date: 09/04/2022    Years since quitting: 0.1   Smokeless tobacco: Never  Vaping Use   Vaping Use: Never used  Substance and Sexual Activity   Alcohol use: Yes    Alcohol/week: 6.0 standard drinks of alcohol    Types: 4 Glasses of wine, 2 Standard drinks or equivalent per week   Drug use: No   Sexual activity: Yes  Other Topics Concern   Not on file  Social History Narrative   Not on file   Social Determinants  of Health   Financial Resource Strain: Not on file  Food Insecurity: Not on file  Transportation Needs: Not on file  Physical Activity: Not on file  Stress: Not on file  Social Connections: Not on file  Intimate Partner Violence: Not on file     Allergies  Allergen Reactions   Covid-19 (Mrna) Vaccine Swelling   Amoxicillin Itching   Prednisone Other (See Comments)    "Makes me crazy"     CBC    Component Value Date/Time   WBC 13.7 (H) 09/23/2022 0026   RBC 3.78 (L) 09/23/2022 0026   HGB 12.1 09/23/2022 0026   HGB 14.7 04/20/2022 0953   HCT 36.0 09/23/2022 0026   HCT 44.0 04/20/2022 0953   PLT 251 09/23/2022 0026   PLT 322 04/20/2022 0953   MCV 95.2 09/23/2022 0026   MCV 98 (H) 04/20/2022 0953   MCH 32.0 09/23/2022 0026   MCHC 33.6 09/23/2022 0026   RDW 11.9 09/23/2022 0026   RDW 11.3 (L) 04/20/2022 0953   LYMPHSABS 2.5 04/20/2022 0953   EOSABS 0.3 04/20/2022 0953   BASOSABS 0.1 04/20/2022 0953    Pulmonary Functions Testing Results:    Latest Ref Rng & Units 09/01/2022    2:28 PM  PFT Results  FVC-Pre L 2.92   FVC-Predicted Pre % 72   FVC-Post L 3.16   FVC-Predicted Post % 78   Pre FEV1/FVC % % 86   Post FEV1/FCV % % 77   FEV1-Pre L 2.52   FEV1-Predicted Pre % 80   FEV1-Post L 2.45   DLCO uncorrected ml/min/mmHg 16.71   DLCO UNC% %  68   DLVA Predicted % 81   TLC L 5.23   TLC % Predicted % 87   RV % Predicted % 88     Outpatient Medications Prior to Visit  Medication Sig Dispense Refill   acetaminophen (TYLENOL) 500 MG tablet Take 1,000 mg by mouth every 6 (six) hours as needed for moderate pain.     alum & mag hydroxide-simeth (MAALOX/MYLANTA) 200-200-20 MG/5ML suspension Take 30 mLs by mouth every 6 (six) hours as needed for indigestion or heartburn.     Cholecalciferol (VITAMIN D3) 125 MCG (5000 UT) capsule Take 5,000 Units by mouth daily.     Ciclopirox 1 % shampoo Apply 1 Application topically daily as needed (eczema on scalp).      cyanocobalamin (VITAMIN B12) 1000 MCG tablet Take 1,000 mcg by mouth daily.     diphenhydrAMINE (BENADRYL) 25 MG tablet Take 25 mg by mouth daily as needed for itching.     doxycycline (PERIOSTAT) 20 MG tablet Take 20-40 mg by mouth See admin instructions. Take 20 mg daily, increase to 40 mg as needed for rosacea flare     fluocinolone (SYNALAR) 0.01 % external solution Apply 1 Application topically at bedtime as needed (eczema).     loratadine (CLARITIN) 10 MG tablet Take 10 mg by mouth daily.     Magnesium Cl-Calcium Carbonate (SLOW MAGNESIUM/CALCIUM PO) Take 1 tablet by mouth daily.     montelukast (SINGULAIR) 10 MG tablet TAKE 1 TABLET BY MOUTH EVERYDAY AT BEDTIME 90 tablet 3   Multiple Vitamin (MULTIVITAMIN) capsule Take 1 capsule by mouth daily.     oxyCODONE (OXY IR/ROXICODONE) 5 MG immediate release tablet Take 1 tablet (5 mg total) by mouth every 4 (four) hours as needed for moderate pain. 30 tablet 0   pregabalin (LYRICA) 25 MG capsule Take 1 capsule (25 mg total) by mouth 2 (two) times daily. 60 capsule 0   pseudoephedrine (SUDAFED) 30 MG tablet Take 30 mg by mouth every 4 (four) hours as needed for congestion.     traMADol (ULTRAM) 50 MG tablet Take 1 tablet (50 mg total) by mouth every 6 (six) hours as needed. 40 tablet 0   triamcinolone (NASACORT) 55 MCG/ACT AERO nasal inhaler Place 2 sprays into the nose daily.     No facility-administered medications prior to visit.

## 2022-10-12 NOTE — Telephone Encounter (Signed)
Pt. Called back after apt stated pharmacy didn't give her the right type of lidocaine  she wanted th cream for her chest

## 2022-10-12 NOTE — Telephone Encounter (Signed)
Patient is aware of below message and voiced her understanding.  Nothing further needed.   

## 2022-10-14 ENCOUNTER — Encounter: Payer: Self-pay | Admitting: *Deleted

## 2022-10-19 ENCOUNTER — Other Ambulatory Visit: Payer: Self-pay | Admitting: Thoracic Surgery (Cardiothoracic Vascular Surgery)

## 2022-10-19 DIAGNOSIS — C3431 Malignant neoplasm of lower lobe, right bronchus or lung: Secondary | ICD-10-CM

## 2022-11-06 ENCOUNTER — Ambulatory Visit
Admission: RE | Admit: 2022-11-06 | Discharge: 2022-11-06 | Disposition: A | Discharge status: Home / Self Care | Payer: Managed Care, Other (non HMO) | Source: Ambulatory Visit | Attending: Thoracic Surgery (Cardiothoracic Vascular Surgery)

## 2022-11-06 ENCOUNTER — Ambulatory Visit (INDEPENDENT_AMBULATORY_CARE_PROVIDER_SITE_OTHER): Payer: Self-pay | Admitting: Thoracic Surgery (Cardiothoracic Vascular Surgery)

## 2022-11-06 ENCOUNTER — Ambulatory Visit: Payer: Managed Care, Other (non HMO) | Admitting: Thoracic Surgery (Cardiothoracic Vascular Surgery)

## 2022-11-06 VITALS — BP 129/84 | HR 76 | Resp 18 | Ht 70.0 in | Wt 188.0 lb

## 2022-11-06 DIAGNOSIS — Z902 Acquired absence of lung [part of]: Secondary | ICD-10-CM

## 2022-11-06 DIAGNOSIS — C3431 Malignant neoplasm of lower lobe, right bronchus or lung: Secondary | ICD-10-CM

## 2022-11-06 NOTE — Progress Notes (Signed)
      [address]       [place] [address]             [phone]        KEIDY MCKESSON [facility] Medical Record #[id number] Date of Birth: 1960/05/28  Referring: Raechel Chute, MD Primary Care: Reubin Milan, MD Primary Cardiologist:None  Reason for visit:   follow-up  History of Present Illness:     62 year old female presents for her 1 month follow-up appointment.  Overall she is doing well.  She has some paresthesias along her access incision but this is improving.  Physical Exam: BP 129/84 (BP Location: Left Arm, Patient Position: Sitting)   Pulse 76   Resp 18   Ht 5\' 10"  (1.778 m)   Wt 188 lb (85.3 kg)   SpO2 99% Comment: RA  BMI 26.98 kg/m   Alert NAD Abdomen, ND No peripheral edema   Diagnostic Studies & Laboratory data: CXR: Clear   Assessment / Plan:   62yo female s/p R RATS, RLLectomy for T1bN0M0 adenocarcinoma.  She has an appointment with her medical oncologist in December.  She will follow-up as needed.   Corliss Skains 11/06/2022 3:08 PM

## 2023-02-24 ENCOUNTER — Encounter: Payer: Self-pay | Admitting: Internal Medicine

## 2023-03-30 ENCOUNTER — Ambulatory Visit
Admission: RE | Admit: 2023-03-30 | Discharge: 2023-03-30 | Disposition: A | Discharge status: Home / Self Care | Payer: Managed Care, Other (non HMO) | Source: Ambulatory Visit | Attending: Internal Medicine

## 2023-03-30 DIAGNOSIS — C3431 Malignant neoplasm of lower lobe, right bronchus or lung: Secondary | ICD-10-CM

## 2023-04-07 ENCOUNTER — Other Ambulatory Visit: Payer: Managed Care, Other (non HMO)

## 2023-04-16 ENCOUNTER — Encounter: Payer: Self-pay | Admitting: Internal Medicine

## 2023-04-16 ENCOUNTER — Other Ambulatory Visit: Payer: Self-pay | Admitting: Internal Medicine

## 2023-04-16 ENCOUNTER — Inpatient Hospital Stay: Payer: Managed Care, Other (non HMO) | Attending: Internal Medicine

## 2023-04-16 ENCOUNTER — Inpatient Hospital Stay (HOSPITAL_BASED_OUTPATIENT_CLINIC_OR_DEPARTMENT_OTHER): Payer: Managed Care, Other (non HMO) | Admitting: Internal Medicine

## 2023-04-16 VITALS — BP 147/77 | HR 70 | Temp 98.2°F | Ht 70.0 in | Wt 200.2 lb

## 2023-04-16 DIAGNOSIS — Z87891 Personal history of nicotine dependence: Secondary | ICD-10-CM | POA: Diagnosis not present

## 2023-04-16 DIAGNOSIS — Z79899 Other long term (current) drug therapy: Secondary | ICD-10-CM | POA: Insufficient documentation

## 2023-04-16 DIAGNOSIS — C3431 Malignant neoplasm of lower lobe, right bronchus or lung: Secondary | ICD-10-CM

## 2023-04-16 DIAGNOSIS — J3089 Other allergic rhinitis: Secondary | ICD-10-CM

## 2023-04-16 LAB — CBC WITH DIFFERENTIAL (CANCER CENTER ONLY)
Abs Immature Granulocytes: 0.03 10*3/uL (ref 0.00–0.07)
Basophils Absolute: 0.1 10*3/uL (ref 0.0–0.1)
Basophils Relative: 1 %
Eosinophils Absolute: 0.3 10*3/uL (ref 0.0–0.5)
Eosinophils Relative: 3 %
HCT: 40.4 % (ref 36.0–46.0)
Hemoglobin: 13.4 g/dL (ref 12.0–15.0)
Immature Granulocytes: 0 %
Lymphocytes Relative: 23 %
Lymphs Abs: 1.9 10*3/uL (ref 0.7–4.0)
MCH: 32.1 pg (ref 26.0–34.0)
MCHC: 33.2 g/dL (ref 30.0–36.0)
MCV: 96.9 fL (ref 80.0–100.0)
Monocytes Absolute: 0.6 10*3/uL (ref 0.1–1.0)
Monocytes Relative: 7 %
Neutro Abs: 5.4 10*3/uL (ref 1.7–7.7)
Neutrophils Relative %: 66 %
Platelet Count: 293 10*3/uL (ref 150–400)
RBC: 4.17 MIL/uL (ref 3.87–5.11)
RDW: 12.4 % (ref 11.5–15.5)
WBC Count: 8.3 10*3/uL (ref 4.0–10.5)
nRBC: 0 % (ref 0.0–0.2)

## 2023-04-16 LAB — CMP (CANCER CENTER ONLY)
ALT: 20 U/L (ref 0–44)
AST: 18 U/L (ref 15–41)
Albumin: 3.9 g/dL (ref 3.5–5.0)
Alkaline Phosphatase: 104 U/L (ref 38–126)
Anion gap: 8 (ref 5–15)
BUN: 9 mg/dL (ref 8–23)
CO2: 26 mmol/L (ref 22–32)
Calcium: 9.4 mg/dL (ref 8.9–10.3)
Chloride: 104 mmol/L (ref 98–111)
Creatinine: 0.54 mg/dL (ref 0.44–1.00)
GFR, Estimated: >60 mL/min (ref >60)
Glucose, Bld: 105 mg/dL — ABNORMAL HIGH (ref 70–99)
Potassium: 5 mmol/L (ref 3.5–5.1)
Sodium: 138 mmol/L (ref 135–145)
Total Bilirubin: 0.5 mg/dL (ref <1.2)
Total Protein: 6.8 g/dL (ref 6.5–8.1)

## 2023-04-16 NOTE — Assessment & Plan Note (Addendum)
#   Adenocarcinoma right lower lobe-clinical stage I [s/p Bronch- Dr.Dgyali]. MAY 13th, 2024- s/p robotic right lobectomy-  pT1b, pN0. Marland Kitchen.E. LUNG, RIGHT LOWER LOBE, LOBECTOMY: -  Adenocarcinoma, predominantly acinar type with focal micropapillary pattern (less than  5%) and focal mucinous features. -  1.4 x 1.4 x 1.2 cm. -  Margins negative.-  Negative for lymphovascular and pleural invasion.  pT1b pN0. NO ROLE FOR ADJUVANT THERAPY.  Currently on surveillance.  # CT scan chest contrast November 21-2024-   Status post interval right lower lobectomy; Small, loculated right pleural effusion [likely postsurgical/benign]. No evidence of recurrent or metastatic disease in the chest; Unchanged small subpleural nodules of the peripheral right upper lobe, measuring up to 0.5 cm, not previously FDG avid and benign.  # Continue surveillance imaging with CT scan every 6 months for the first 2-3 years and then follow-up imaging on an annual basis.  # Smoking: Congratulated the patient on quitting smoking-April 2024.  # Genetics counseling: Family history of malignancies.  Patient states that she had possible testing 4-5 years ago; recommend sending [place] the results.   #Incidental findings on Imaging  CT , 2024: Emphysema; Coronary artery disease [sp evaluation with cards]; Cholelithiasis. I reviewed/discussed/counseled the patient.   # DISPOSITION: # Follow up in 6 months- MD; NO labs-  CT chest prior-  Dr.B  # I reviewed the blood work- with the patient in detail; also reviewed the imaging independently [as summarized above]; and with the patient in detail.

## 2023-04-16 NOTE — Progress Notes (Signed)
[facility] CONSULT NOTE  Patient Care Team: Reubin Milan, MD as PCP - General (Internal Medicine) Jones Broom, MD as Referring Physician (Dermatology) Luella Cook Deloris Ping, MD (Obstetrics and Gynecology) Vernie Murders, MD (Otolaryngology) Glory Buff, RN as Oncology Nurse Navigator Earna Coder, MD as Consulting Physician (Oncology)  CHIEF COMPLAINTS/PURPOSE OF CONSULTATION: lung cancer  #  Oncology History Overview Note  FEB 2024- PET scan:1. The posterior right lower lobe pulmonary nodule of concern on recent lung cancer screening chest CT shows no hypermetabolism on PET imaging today. While reassuring, low grade or well differentiated neoplasm can be poorly FDG avid.   APRIL 2024-  [Dr.Dgyali]LUNG, RLL, FINE NEEDLE ASPIRATION:  - Adenocarcinoma  - See comment   B. LUNG, RLL, BRUSHING:  - Adenocarcinoma  - See comment   COMMENT:  Sufficient tissue for molecular testing is present.  Dr. Venetia Night  reviewed the case and agrees with the above diagnosis.   LUNG: Resection  Synchronous Tumors: N/A Total Number of Primary Tumors: 1 Procedure: Lobectomy Specimen Laterality: Right Tumor Focality: Unifocal Tumor Site: Right lower lobe Tumor Size: 1.4 x 1.4 x 1.2 cm      Total Tumor Size: Same as above      Invasive Tumor Size (applies only to invasive nonmucinous adenocarcinoma with a lepidic           component): Same as above Histologic Type: Adenocarcinoma, predominantly acinar type with focal mucinous features and rare micropapillary features (less than 5%). Visceral Pleura Invasion: Not identified Direct Invasion of Adjacent Structures: N/A Lymphovascular Invasion: Not identified Margins: All margins negative      Closest Margin(s) to Invasive Carcinoma: Bronchial at 4 cm margin(s) Involved by Invasive Carcinoma: N/A      Margin Status for Non-Invasive Tumor: N/A Treatment Effect: No known presurgical therapy      Percentage  of Residual Viable Tumor: N/A Regional Lymph Nodes:      Number of Lymph Nodes Involved: 0                           Nodal Sites with Tumor: 0      Number of Lymph Nodes Examined: 4                      Nodal Sites Examined: 2 (hilar and station 7) Distant Metastasis:      Distant Site(s) Involved: N/A Pathologic Stage Classification (pTNM, AJCC 8th Edition): pT1b, pN0 Ancillary Studies: Can be performed on clinician request Representative Tumor Block: E1 Comment(s): None ([id number])    Primary cancer of right lower lobe of lung (HCC)  08/28/2022 Initial Diagnosis   Primary cancer of right lower lobe of lung   08/28/2022 Cancer Staging   Staging form: Lung, AJCC 8th Edition - Clinical: Stage IA2 (cT1b, cN0, cM0) - Signed by Earna Coder, MD on 08/28/2022      HISTORY OF PRESENTING ILLNESS: Patient ambulating-independently.  Alone.  Sharon [name] 62 y.o.  female history of smoking-currently quit with right lower lobe stage I lung cancer is here for follow-up/ and review the results of the CT scan.  Patient still complains of intermittent discomfort in the right ribs otherwise denies any worsening pain. Otherwise denies any unusual shortness of breath or cough.  Denies any fevers or chills.  No nausea no vomiting.   Review of Systems  Constitutional:  Negative for chills, diaphoresis, fever, malaise/fatigue and weight loss.  HENT:  Negative for nosebleeds and sore throat.   Eyes:  Negative for double vision.  Respiratory:  Negative for cough, hemoptysis, sputum production, shortness of breath and wheezing.   Cardiovascular:  Negative for chest pain, palpitations, orthopnea and leg swelling.  Gastrointestinal:  Negative for abdominal pain, blood in stool, constipation, diarrhea, heartburn, melena, nausea and vomiting.  Genitourinary:  Negative for dysuria, frequency and urgency.  Musculoskeletal:  Negative for back pain and joint pain.  Skin: Negative.  Negative for  itching and rash.  Neurological:  Negative for dizziness, tingling, focal weakness, weakness and headaches.  Endo/Heme/Allergies:  Does not bruise/bleed easily.  Psychiatric/Behavioral:  Negative for depression. The patient is not nervous/anxious and does not have insomnia.      MEDICAL HISTORY:  Past Medical History:  Diagnosis Date   Benign neoplasm of ascending colon    BRCA negative 09/2016   My Risk neg   Disease of nail 03/11/2015   Emphysema lung (HCC)    Encounter for nonprocreative genetic counseling    Family history of breast cancer    and pancreatic   Floppy mitral valve    told this "long ago".  No issues   Headache    sinus, seasonal   Increased risk of breast cancer 09/2016   IBIS=17.0%/riskscore=22.4%   Numbness in feet    s/p foot and back surgery   Polyp of sigmoid colon    Polyp of transverse colon    PONV (postoperative nausea and vomiting)    Rosacea    Special screening for malignant neoplasms, colon    Tendinitis of wrist 03/11/2015   Tinnitus 12/2020    SURGICAL HISTORY: Past Surgical History:  Procedure Laterality Date   BRONCHIAL BIOPSY  08/21/2022   Procedure: BRONCHIAL BIOPSIES;  Surgeon: Raechel Chute, MD;  Location: [facility] END[facility];  Service: Pulmonary;;   BRONCHIAL BRUSHINGS  08/21/2022   Procedure: BRONCHIAL BRUSHINGS;  Surgeon: Raechel Chute, MD;  Location: [facility] END[facility];  Service: Pulmonary;;   BRONCHIAL NEEDLE ASPIRATION BIOPSY  08/21/2022   Procedure: BRONCHIAL NEEDLE ASPIRATION BIOPSIES;  Surgeon: Raechel Chute, MD;  Location: [facility];  Service: Pulmonary;;   BRONCHIAL WASHINGS  08/21/2022   Procedure: BRONCHIAL WASHINGS;  Surgeon: Raechel Chute, MD;  Location: [facility]OS[facility];  Service: Pulmonary;;   BUNIONECTOMY Bilateral    CESAREAN SECTION  1987   COLONOSCOPY WITH PROPOFOL N/A 03/12/2016   Procedure: COLONOSCOPY WITH PROPOFOL;  Surgeon: Midge Minium, MD;  Location: [facility];  Service: Endoscopy;  Laterality: N/A;    COLONOSCOPY WITH PROPOFOL N/A 06/05/2019   Procedure: COLONOSCOPY WITH BIOPSIES;  Surgeon: Midge Minium, MD;  Location: [facility] SURGERY [facility];  Service: Endoscopy;  Laterality: N/A;   INTERCOSTAL NERVE BLOCK Right 09/21/2022   Procedure: INTERCOSTAL NERVE BLOCK;  Surgeon: Corliss Skains, MD;  Location: [facility] OR;  Service: Thoracic;  Laterality: Right;   LUMBAR DISC SURGERY     NODE DISSECTION Right 09/21/2022   Procedure: NODE DISSECTION;  Surgeon: Corliss Skains, MD;  Location: [facility] OR;  Service: Thoracic;  Laterality: Right;   POLYPECTOMY  03/12/2016   Procedure: POLYPECTOMY;  Surgeon: Midge Minium, MD;  Location: [facility] SUR[facility]ERY [facility];  Service: Endoscopy;;   POLYPECTOMY N/A 06/05/2019   Procedure: POLYPECTOMY;  Surgeon: Midge Minium, MD;  Location: [facility] SURGERY CNTR;  Service: Endoscopy;  Laterality: N/A;   VIDEO BRONCHOSCOPY WITH ENDOBRONCHIAL ULTRASOUND Right 08/21/2022   Procedure: VIDEO BRONCHOSCOPY WITH ENDOBRONCHIAL ULTRASOUND;  Surgeon: Raechel Chute, MD;  Location: [facility] ENDOS[facility];  Service: Pulmonary;  Laterality: Right;  SOCIAL HISTORY: Social History   Socioeconomic History   Marital status: Married    Spouse name: Rocky Link   Number of children: Not on file   Years of education: Not on file   Highest education level: Not on file  Occupational History   Not on file  Tobacco Use   Smoking status: Former    Current packs/day: 0.00    Average packs/day: 1 pack/day for 45.0 years (45.0 ttl pk-yrs)    Types: Cigarettes    Start date: 09/03/1977    Quit date: 09/04/2022    Years since quitting: 0.6   Smokeless tobacco: Never  Vaping Use   Vaping status: Never Used  Substance and Sexual Activity   Alcohol use: Yes    Alcohol/week: 6.0 standard drinks of alcohol    Types: 4 Glasses of wine, 2 Standard drinks or equivalent per week   Drug use: No   Sexual activity: Yes  Other Topics Concern   Not on file  Social History Narrative   Not on file   Social Determinants of  Health   Financial Resource Strain: Not on file  Food Insecurity: Not on file  Transportation Needs: Not on file  Physical Activity: Not on file  Stress: Not on file  Social Connections: Not on file  Intimate Partner Violence: Not on file    FAMILY HISTORY: Family History  Problem Relation Age of Onset   COPD Mother    Pulmonary fibrosis Mother    Heart attack Father    Heart disease Father    Pancreatic cancer Maternal Uncle 22   Breast cancer Maternal Grandmother 60       twice, bilat breast--88   Breast cancer Other    Breast cancer Other    Ovarian cancer Other     ALLERGIES:  is allergic to covid-19 (mrna) vaccine, amoxicillin, and prednisone.  MEDICATIONS:  Current Outpatient Medications  Medication Sig Dispense Refill   acetaminophen (TYLENOL) 500 MG tablet Take 1,000 mg by mouth every 6 (six) hours as needed for moderate pain.     alum & mag hydroxide-simeth (MAALOX/MYLANTA) 200-200-20 MG/5ML suspension Take 30 mLs by mouth every 6 (six) hours as needed for indigestion or heartburn.     Cholecalciferol (VITAMIN D3) 125 MCG (5000 UT) capsule Take 5,000 Units by mouth daily.     Ciclopirox 1 % shampoo Apply 1 Application topically daily as needed (eczema on scalp).     cyanocobalamin (VITAMIN B12) 1000 MCG tablet Take 1,000 mcg by mouth daily.     diphenhydrAMINE (BENADRYL) 25 MG tablet Take 25 mg by mouth daily as needed for itching.     doxycycline (PERIOSTAT) 20 MG tablet Take 20-40 mg by mouth See admin instructions. Take 20 mg daily, increase to 40 mg as needed for rosacea flare     fluocinolone (SYNALAR) 0.01 % external solution Apply 1 Application topically at bedtime as needed (eczema).     lidocaine (XYLOCAINE) 5 % ointment Apply 1 Application topically 3 (three) times daily as needed. 35.44 g 1   loratadine (CLARITIN) 10 MG tablet Take 10 mg by mouth daily.     Magnesium Cl-Calcium Carbonate (SLOW MAGNESIUM/CALCIUM PO) Take 1 tablet by mouth daily.      montelukast (SINGULAIR) 10 MG tablet TAKE 1 TABLET BY MOUTH EVERYDAY AT BEDTIME 90 tablet 3   Multiple Vitamin (MULTIVITAMIN) capsule Take 1 capsule by mouth daily.     pseudoephedrine (SUDAFED) 30 MG tablet Take 30 mg by mouth every 4 (four)  hours as needed for congestion.     triamcinolone (NASACORT) 55 MCG/ACT AERO nasal inhaler Place 2 sprays into the nose daily.     No current facility-administered medications for this visit.      PHYSICAL EXAMINATION:   Vitals:   04/16/23 1303  BP: (!) 147/77  Pulse: 70  Temp: 98.2 F (36.8 C)  SpO2: 99%    Filed Weights   04/16/23 1303  Weight: 200 lb 3.2 oz (90.8 kg)     Physical Exam Vitals and nursing note reviewed.  HENT:     Head: Normocephalic and atraumatic.     Mouth/Throat:     Pharynx: Oropharynx is clear.  Eyes:     Extraocular Movements: Extraocular movements intact.     Pupils: Pupils are equal, round, and reactive to light.  Cardiovascular:     Rate and Rhythm: Normal rate and regular rhythm.  Pulmonary:     Comments: Decreased breath sounds bilaterally.  Abdominal:     Palpations: Abdomen is soft.  Musculoskeletal:        General: Normal range of motion.     Cervical back: Normal range of motion.  Skin:    General: Skin is warm.  Neurological:     General: No focal deficit present.     Mental Status: She is alert and oriented to person, place, and time.  Psychiatric:        Behavior: Behavior normal.        Judgment: Judgment normal.      LABORATORY DATA:  I have reviewed the data as listed Lab Results  Component Value Date   WBC 8.3 04/16/2023   HGB 13.4 04/16/2023   HCT 40.4 04/16/2023   MCV 96.9 04/16/2023   PLT 293 04/16/2023   Recent Labs    09/17/22 1500 09/22/22 0020 09/23/22 0026 04/16/23 1256  NA 139 131* 128* 138  K 3.8 3.9 4.0 5.0  CL 107 101 95* 104  CO2 26 23 25 26   GLUCOSE 95 124* 110* 105*  BUN 10 10 11 9   CREATININE 0.72 0.63 0.69 0.54  CALCIUM 9.2 9.0 8.9 9.4   GFRNONAA >60 >60 >60 >60  PROT 6.2*  --  5.3* 6.8  ALBUMIN 3.8  --  3.2* 3.9  AST 15  --  91* 18  ALT 15  --  81* 20  ALKPHOS 103  --  96 104  BILITOT 0.6  --  0.9 0.5    RADIOGRAPHIC STUDIES: I have personally reviewed the radiological images as listed and agreed with the findings in the report. CT CHEST WO CONTRAST  Result Date: 04/15/2023 CLINICAL DATA:  Non-small-cell lung cancer surveillance * Tracking Code: BO * EXAM: CT CHEST WITHOUT CONTRAST TECHNIQUE: Multidetector CT imaging of the chest was performed following the standard protocol without IV contrast. RADIATION DOSE REDUCTION: This exam was performed according to the departmental dose-optimization program which includes automated exposure control, adjustment of the mA and/or kV according to patient size and/or use of iterative reconstruction technique. COMPARISON:  08/17/2022 FINDINGS: Cardiovascular: Aortic atherosclerosis. Normal heart size. Left coronary artery calcifications. No pericardial effusion. Mediastinum/Nodes: No enlarged mediastinal, hilar, or axillary lymph nodes. Thyroid gland, trachea, and esophagus demonstrate no significant findings. Lungs/Pleura: Status post interval right lower lobectomy. Small, loculated right pleural effusion. Moderate centrilobular and paraseptal emphysema. Unchanged small subpleural nodules of the peripheral right upper lobe, measuring up to 0.5 cm, not previously FDG avid and benign (series 3, image 89). Upper Abdomen: No acute abnormality. Rim  calcified gallstones. Definitively benign, macroscopic fat containing bilateral adrenal adenomata, for which no further follow-up or characterization is required. Musculoskeletal: No chest wall abnormality. No acute osseous findings. IMPRESSION: 1. Status post interval right lower lobectomy. 2. Small, loculated right pleural effusion. 3. No evidence of recurrent or metastatic disease in the chest. 4. Unchanged small subpleural nodules of the peripheral  right upper lobe, measuring up to 0.5 cm, not previously FDG avid and benign. 5. Emphysema. 6. Coronary artery disease. 7. Cholelithiasis. Aortic Atherosclerosis (ICD10-I70.0) and Emphysema (ICD10-J43.9). Electronically Signed   By: Jearld Lesch M.D.   On: 04/15/2023 15:45    ASSESSMENT & PLAN:   Primary cancer of right lower lobe of lung (HCC) # Adenocarcinoma right lower lobe-clinical stage I [s/p Bronch- Dr.Dgyali]. MAY 13th, 2024- s/p robotic right lobectomy-  pT1b, pN0. Marland Kitchen.E. LUNG, RIGHT LOWER LOBE, LOBECTOMY: -  Adenocarcinoma, predominantly acinar type with focal micropapillary pattern (less than  5%) and focal mucinous features. -  1.4 x 1.4 x 1.2 cm. -  Margins negative.-  Negative for lymphovascular and pleural invasion.  pT1b pN0. NO ROLE FOR ADJUVANT THERAPY.  Currently on surveillance.  # CT scan chest contrast November 21-2024-   Status post interval right lower lobectomy; Small, loculated right pleural effusion [likely postsurgical/benign]. No evidence of recurrent or metastatic disease in the chest; Unchanged small subpleural nodules of the peripheral right upper lobe, measuring up to 0.5 cm, not previously FDG avid and benign.  # Continue surveillance imaging with CT scan every 6 months for the first 2-3 years and then follow-up imaging on an annual basis.  # Smoking: Congratulated the patient on quitting smoking-April 2024.  # Genetics counseling: Family history of malignancies.  Patient states that she had possible testing 4-5 years ago; recommend sending [place] the results.   #Incidental findings on Imaging  CT , 2024: Emphysema; Coronary artery disease [sp evaluation with cards]; Cholelithiasis. I reviewed/discussed/counseled the patient.   # DISPOSITION: # Follow up in 6 months- MD; NO labs-  CT chest prior-  Dr.B  # I reviewed the blood work- with the patient in detail; also reviewed the imaging independently [as summarized above]; and with the patient in detail.    All  questions were answered. The patient knows to call the clinic with any problems, questions or concerns.    Earna Coder, MD 04/16/2023 1:47 PM

## 2023-04-16 NOTE — Progress Notes (Signed)
C/o pain in rt side area, comes and goes from an 8/10 to a 2/10.  CT scan chest 03/30/23.

## 2023-04-16 NOTE — Telephone Encounter (Signed)
Requested Prescriptions  Pending Prescriptions Disp Refills   montelukast (SINGULAIR) 10 MG tablet [Pharmacy Med Name: MONTELUKAST SOD 10 MG TABLET] 90 tablet 0    Sig: TAKE 1 TABLET BY MOUTH EVERYDAY AT BEDTIME     Pulmonology:  Leukotriene Inhibitors Failed - 04/16/2023  1:40 AM      Failed - Valid encounter within last 12 months    Recent Outpatient Visits           7 months ago Non-recurrent acute serous otitis media of right ear   [place] [facility] at [facility], Nyoka Cowden, MD   12 months ago Annual physical exam   [facility] at [facility], Nyoka Cowden, MD   1 year ago Aortic atherosclerosis [facility])   [place] [facility] at [facility], Nyoka Cowden, MD   2 years ago Annual physical exam   [facility] at [facility], Nyoka Cowden, MD   3 years ago Annual physical exam   [facility] at [facility], Nyoka Cowden, MD       Future Appointments             In 1 week Judithann Graves, Nyoka Cowden, MD [facility] at [facility], [facility]

## 2023-04-23 ENCOUNTER — Encounter: Payer: Self-pay | Admitting: Internal Medicine

## 2023-04-23 ENCOUNTER — Ambulatory Visit (INDEPENDENT_AMBULATORY_CARE_PROVIDER_SITE_OTHER): Payer: Managed Care, Other (non HMO) | Admitting: Internal Medicine

## 2023-04-23 VITALS — BP 112/62 | HR 66 | Ht 70.0 in | Wt 203.0 lb

## 2023-04-23 DIAGNOSIS — E559 Vitamin D deficiency, unspecified: Secondary | ICD-10-CM

## 2023-04-23 DIAGNOSIS — E782 Mixed hyperlipidemia: Secondary | ICD-10-CM

## 2023-04-23 DIAGNOSIS — R7303 Prediabetes: Secondary | ICD-10-CM | POA: Insufficient documentation

## 2023-04-23 DIAGNOSIS — C3431 Malignant neoplasm of lower lobe, right bronchus or lung: Secondary | ICD-10-CM | POA: Diagnosis not present

## 2023-04-23 DIAGNOSIS — E042 Nontoxic multinodular goiter: Secondary | ICD-10-CM

## 2023-04-23 DIAGNOSIS — Z23 Encounter for immunization: Secondary | ICD-10-CM

## 2023-04-23 DIAGNOSIS — Z1231 Encounter for screening mammogram for malignant neoplasm of breast: Secondary | ICD-10-CM

## 2023-04-23 DIAGNOSIS — Z0000 Encounter for general adult medical examination without abnormal findings: Secondary | ICD-10-CM | POA: Diagnosis not present

## 2023-04-23 NOTE — Assessment & Plan Note (Signed)
Adenocarcinoma, predominantly acinar type with focal micropapillary pattern (less than  5%) and focal mucinous features. -  1.4 x 1.4 x 1.2 cm. -  Margins negative.-  Negative for lymphovascular and pleural invasion.  pT1b pN0. NO ROLE FOR ADJUVANT THERAPY.  Will continue CT scan every 6 months for the first 2-3 years and then follow-up imaging on an annual basis.

## 2023-04-23 NOTE — Assessment & Plan Note (Addendum)
Managed with Zetia started in August.  She is very reluctant to take a statin but might consider Crestor three times per week. Lab Results  Component Value Date   LDLCALC 132 (H) 04/20/2022  LDL goal < 70 per cardiology.

## 2023-04-23 NOTE — Patient Instructions (Signed)
Call [facility] to schedule your mammogram at [phone].  

## 2023-04-23 NOTE — Assessment & Plan Note (Signed)
Continue daily supplements 

## 2023-04-23 NOTE — Assessment & Plan Note (Signed)
Followed by ENT. Lab Results  Component Value Date   TSH 0.937 04/20/2022

## 2023-04-23 NOTE — Progress Notes (Signed)
Date:  04/23/2023   Name:  Sharon [name]   DOB:  06/29/1960   MRN:  [id number]   Chief Complaint: Annual Exam Sharon [name] is a 62 y.o. female who presents today for her Complete Annual Exam. She feels well. She reports exercising - none. She reports she is sleeping well. Breast complaints - none.    Mammogram: 05/2022 DEXA: none Colonoscopy: 05/2019 repeat 5 yrs Pap: 03/2020 neg/neg  Health Maintenance Due  Topic Date Due   HIV Screening  Never done   COVID-19 Vaccine (3 - Pfizer risk series) 08/28/2019    Immunization History  Administered Date(s) Administered   Influenza Inj Mdck Quad Pf 02/25/2017   Influenza,inj,Quad PF,6+ Mos 03/11/2018, 03/14/2019   PFIZER Comirnaty(Gray Top)Covid-19 Tri-Sucrose Vaccine 07/14/2019, 07/31/2019   PNEUMOCOCCAL CONJUGATE-20 04/23/2023   Pneumococcal Conjugate-13 03/11/2018   Tdap 06/13/2014   Zoster Recombinant(Shingrix) 11/15/2019, 04/08/2020    Hyperlipidemia This is a chronic problem. Recent lipid tests were reviewed and are high. Pertinent negatives include no chest pain or shortness of breath. Current antihyperlipidemic treatment includes ezetimibe.  Allergies - chronic congestion, minimal drip or sneezing.  On Singulair, Claritin and Flonase.   Lung cancer - completely resected with no indication for adjuvant therapy.  She will get CT every 6 months ordered by Oncology.  Review of Systems  Constitutional:  Negative for chills and fatigue.  HENT:  Positive for congestion. Negative for postnasal drip.   Respiratory:  Positive for cough. Negative for chest tightness, shortness of breath and wheezing.   Cardiovascular:  Negative for chest pain and palpitations.  Gastrointestinal:  Negative for abdominal pain, constipation and diarrhea.  Genitourinary:  Negative for frequency and urgency.  Musculoskeletal:  Negative for arthralgias.  Allergic/Immunologic: Positive for environmental allergies.  Neurological:  Negative for dizziness  and headaches.  Psychiatric/Behavioral:  Negative for dysphoric mood and sleep disturbance. The patient is not nervous/anxious.      Lab Results  Component Value Date   NA 138 04/16/2023   K 5.0 04/16/2023   CO2 26 04/16/2023   GLUCOSE 105 (H) 04/16/2023   BUN 9 04/16/2023   CREATININE 0.54 04/16/2023   CALCIUM 9.4 04/16/2023   EGFR 100 04/20/2022   GFRNONAA >60 04/16/2023   Lab Results  Component Value Date   CHOL 219 (H) 04/20/2022   HDL 63 04/20/2022   LDLCALC 132 (H) 04/20/2022   TRIG 136 04/20/2022   CHOLHDL 3.5 04/20/2022   Lab Results  Component Value Date   TSH 0.937 04/20/2022   Lab Results  Component Value Date   HGBA1C 5.7 (H) 04/20/2022   Lab Results  Component Value Date   WBC 8.3 04/16/2023   HGB 13.4 04/16/2023   HCT 40.4 04/16/2023   MCV 96.9 04/16/2023   PLT 293 04/16/2023   Lab Results  Component Value Date   ALT 20 04/16/2023   AST 18 04/16/2023   ALKPHOS 104 04/16/2023   BILITOT 0.5 04/16/2023   Lab Results  Component Value Date   VD25OH 45.5 03/14/2019     Patient Active Problem List   Diagnosis Date Noted   Prediabetes 04/23/2023   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/2022   Primary cancer of right lower lobe of lung (HCC) 08/28/2022   Coronary artery calcification 07/19/2022   RBBB (right bundle branch block) 08/25/2021   Aortic atherosclerosis (HCC) 06/13/2021   Multinodular goiter (nontoxic) 04/08/2020   History of colonic polyps    Mixed hyperlipidemia 03/11/2018   Family history  of breast cancer    Vitamin D deficiency 09/28/2016   Environmental and seasonal allergies 05/22/2016   Acne erythematosa 03/11/2015   Compulsive tobacco user syndrome 03/11/2015    Allergies  Allergen Reactions   Covid-19 (Mrna) Vaccine Swelling   Amoxicillin Itching   Prednisone Other (See Comments)    "Makes me crazy"    Past Surgical History:  Procedure Laterality Date   BRONCHIAL BIOPSY  08/21/2022   Procedure:  BRONCHIAL BIOPSIES;  Surgeon: Raechel Chute, MD;  Location: [facility] END[facility];  Service: Pulmonary;;   BRONCHIAL BRUSHINGS  08/21/2022   Procedure: BRONCHIAL BRUSHINGS;  Surgeon: Raechel Chute, MD;  Location: [facility] ENDOSCOPY;  Service: Pulmonary;;   BRONCHIAL NEEDLE ASPIRATION BIOPSY  08/21/2022   Procedure: BRONCHIAL NEEDLE ASPIRATION BIOPSIES;  Surgeon: Raechel Chute, MD;  Location: [facility] ENDOS[facility];  Service: Pulmonary;;   BRONCHIAL WASHINGS  08/21/2022   Procedure: BRONCHIAL WASHINGS;  Surgeon: Raechel Chute, MD;  Location: [facility];  Service: Pulmonary;;   BUNIONECTOMY Bilateral    CESAREAN SECTION  1987   COLONOSCOPY WITH PROPOFOL N/A 03/12/2016   Procedure: COLONOSCOPY WITH PROPOFOL;  Surgeon: Midge Minium, MD;  Location: [facility];  Service: Endoscopy;  Laterality: N/A;   COLONOSCOPY WITH PROPOFOL N/A 06/05/2019   Procedure: COLONOSCOPY WITH BIOPSIES;  Surgeon: Midge Minium, MD;  Location: [facility];  Service: Endoscopy;  Laterality: N/A;   INTERCOSTAL NERVE BLOCK Right 09/21/2022   Procedure: INTERCOSTAL NERVE BLOCK;  Surgeon: Corliss Skains, MD;  Location: [facility] OR;  Service: Thoracic;  Laterality: Right;   LUMBAR DISC SURGERY     NODE DISSECTION Right 09/21/2022   Procedure: NODE DISSECTION;  Surgeon: Corliss Skains, MD;  Location: [facility] OR;  Service: Thoracic;  Laterality: Right;   POLYPECTOMY  03/12/2016   Procedure: POLYPECTOMY;  Surgeon: Midge Minium, MD;  Location: [facility];  Service: Endoscopy;;   POLYPECTOMY N/A 06/05/2019   Procedure: POLYPECTOMY;  Surgeon: Midge Minium, MD;  Location: [facility];  Service: Endoscopy;  Laterality: N/A;   VIDEO BRONCHOSCOPY WITH ENDOBRONCHIAL ULTRASOUND Right 08/21/2022   Procedure: VIDEO BRONCHOSCOPY WITH ENDOBRONCHIAL ULTRASOUND;  Surgeon: Raechel Chute, MD;  Location: [facility];  Service: Pulmonary;  Laterality: Right;    Social History   Tobacco Use   Smoking status: Former    Current  packs/day: 0.00    Average packs/day: 1 pack/day for 45.0 years (45.0 ttl pk-yrs)    Types: Cigarettes    Start date: 09/03/1977    Quit date: 09/04/2022    Years since quitting: 0.6   Smokeless tobacco: Never  Vaping Use   Vaping status: Never Used  Substance Use Topics   Alcohol use: Yes    Alcohol/week: 6.0 standard drinks of alcohol    Types: 4 Glasses of wine, 2 Standard drinks or equivalent per week   Drug use: No     Medication list has been reviewed and updated.  Current Meds  Medication Sig   acetaminophen (TYLENOL) 500 MG tablet Take 1,000 mg by mouth every 6 (six) hours as needed for moderate pain.   alum & mag hydroxide-simeth (MAALOX/MYLANTA) 200-200-20 MG/5ML suspension Take 30 mLs by mouth every 6 (six) hours as needed for indigestion or heartburn.   Cholecalciferol (VITAMIN D3) 125 MCG (5000 UT) capsule Take 5,000 Units by mouth daily.   Ciclopirox 1 % shampoo Apply 1 Application topically daily as needed (eczema on scalp).   cyanocobalamin (VITAMIN B12) 1000 MCG tablet Take 1,000 mcg by mouth daily.   diphenhydrAMINE (BENADRYL) 25  MG tablet Take 25 mg by mouth daily as needed for itching.   doxycycline (PERIOSTAT) 20 MG tablet Take 20-40 mg by mouth See admin instructions. Take 20 mg daily, increase to 40 mg as needed for rosacea flare   ezetimibe (ZETIA) 10 MG tablet Take 10 mg by mouth daily.   fluocinolone (SYNALAR) 0.01 % external solution Apply 1 Application topically at bedtime as needed (eczema).   lidocaine (XYLOCAINE) 5 % ointment Apply 1 Application topically 3 (three) times daily as needed.   loratadine (CLARITIN) 10 MG tablet Take 10 mg by mouth daily.   Magnesium Cl-Calcium Carbonate (SLOW MAGNESIUM/CALCIUM PO) Take 1 tablet by mouth daily.   montelukast (SINGULAIR) 10 MG tablet TAKE 1 TABLET BY MOUTH EVERYDAY AT BEDTIME   Multiple Vitamin (MULTIVITAMIN) capsule Take 1 capsule by mouth daily.   pseudoephedrine (SUDAFED) 30 MG tablet Take 30 mg by mouth  every 4 (four) hours as needed for congestion.   triamcinolone (NASACORT) 55 MCG/ACT AERO nasal inhaler Place 2 sprays into the nose daily.       04/23/2023    8:19 AM 09/02/2022    9:01 AM 04/20/2022    8:44 AM 08/25/2021    1:23 PM  GAD 7 : Generalized Anxiety Score  Nervous, Anxious, on Edge 0 1 0 0  Control/stop worrying 0 0 0 0  Worry too much - different things 0 1 0 0  Trouble relaxing 0 1 0 0  Restless 0 0 0 0  Easily annoyed or irritable 0 1 0 0  Afraid - awful might happen 0 0 0 0  Total GAD 7 Score 0 4 0 0  Anxiety Difficulty Not difficult at all Somewhat difficult Not difficult at all        04/23/2023    8:18 AM 09/02/2022    9:01 AM 04/20/2022    8:44 AM  Depression screen PHQ 2/9  Decreased Interest 0 0 0  Down, Depressed, Hopeless 0 0 0  PHQ - 2 Score 0 0 0  Altered sleeping 0 0 0  Tired, decreased energy 0 0 0  Change in appetite 0 0 0  Feeling bad or failure about yourself  0 0 0  Trouble concentrating 0 0 0  Moving slowly or fidgety/restless 0 0 0  Suicidal thoughts 0 0 0  PHQ-9 Score 0 0 0  Difficult doing work/chores Not difficult at all Not difficult at all Not difficult at all    BP Readings from Last 3 Encounters:  04/23/23 112/62  04/16/23 (!) 147/77  11/06/22 129/84    Physical Exam Vitals and nursing note reviewed.  Constitutional:      General: She is not in acute distress.    Appearance: She is well-developed.  HENT:     Head: Normocephalic and atraumatic.     Right Ear: Tympanic membrane and ear canal normal.     Left Ear: Tympanic membrane and ear canal normal.     Nose:     Right Sinus: No maxillary sinus tenderness.     Left Sinus: No maxillary sinus tenderness.  Eyes:     General: No scleral icterus.       Right eye: No discharge.        Left eye: No discharge.     Conjunctiva/sclera: Conjunctivae normal.  Neck:     Thyroid: No thyromegaly.     Vascular: No carotid bruit.  Cardiovascular:     Rate and Rhythm: Normal  rate and regular rhythm.  Pulses: Normal pulses.     Heart sounds: Normal heart sounds.  Pulmonary:     Effort: Pulmonary effort is normal. No respiratory distress.     Breath sounds: No wheezing.  Chest:  Breasts:    Right: No mass, nipple discharge, skin change or tenderness.     Left: No mass, nipple discharge, skin change or tenderness.  Abdominal:     General: Bowel sounds are normal.     Palpations: Abdomen is soft.     Tenderness: There is no abdominal tenderness.  Musculoskeletal:     Cervical back: Normal range of motion. No erythema.     Right lower leg: No edema.     Left lower leg: No edema.  Lymphadenopathy:     Cervical: No cervical adenopathy.  Skin:    General: Skin is warm and dry.     Capillary Refill: Capillary refill takes less than 2 seconds.     Findings: No rash.  Neurological:     General: No focal deficit present.     Mental Status: She is alert and oriented to person, place, and time.     Cranial Nerves: No cranial nerve deficit.     Sensory: No sensory deficit.     Deep Tendon Reflexes: Reflexes are normal and symmetric.  Psychiatric:        Attention and Perception: Attention normal.        Mood and Affect: Mood normal.     Wt Readings from Last 3 Encounters:  04/23/23 203 lb (92.1 kg)  04/16/23 200 lb 3.2 oz (90.8 kg)  11/06/22 188 lb (85.3 kg)    BP 112/62   Pulse 66   Ht 5\' 10"  (1.778 m)   Wt 203 lb (92.1 kg)   SpO2 99%   BMI 29.13 kg/m   Assessment and Plan:  Problem List Items Addressed This Visit       Unprioritized   Vitamin D deficiency   Continue daily supplements      Relevant Orders   VITAMIN D 25 Hydroxy (Vit-D Deficiency, Fractures)   Mixed hyperlipidemia (Chronic)   Managed with Zetia started in August.  She is very reluctant to take a statin but might consider Crestor three times per week. Lab Results  Component Value Date   LDLCALC 132 (H) 04/20/2022  LDL goal < 70 per cardiology.       Relevant  Medications   ezetimibe (ZETIA) 10 MG tablet   Other Relevant Orders   Lipid panel   Multinodular goiter (nontoxic)   Followed by ENT. 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 -  1.4 x 1.4 x 1.2 cm. -  Margins negative.-  Negative for lymphovascular and pleural invasion.  pT1b pN0. NO ROLE FOR ADJUVANT THERAPY.  Will continue CT scan every 6 months for the first 2-3 years and then follow-up imaging on an annual basis.       Prediabetes   Managed with diet only at this time. Lab Results  Component Value Date   HGBA1C 5.7 (H) 04/20/2022         Relevant Orders   Hemoglobin A1c   Other Visit Diagnoses       Annual physical exam    -  Primary   Relevant Orders   Hemoglobin A1c   Lipid panel   TSH + free T4     Encounter for screening mammogram for breast cancer       Relevant Orders   MM 3D SCREENING MAMMOGRAM BILATERAL BREAST     Need for vaccination for pneumococcus       Relevant Orders   Pneumococcal conjugate vaccine 20-valent (Completed)       Return in about 6 months (around 10/22/2023) for lipids, preDM.    Reubin Milan, MD [facility] [place]

## 2023-04-23 NOTE — Assessment & Plan Note (Signed)
Managed with diet only at this time. Lab Results  Component Value Date   HGBA1C 5.7 (H) 04/20/2022

## 2023-04-28 LAB — HEMOGLOBIN A1C
Est. average glucose Bld gHb Est-mCnc: 117 mg/dL
Hgb A1c MFr Bld: 5.7 % — ABNORMAL HIGH (ref 4.8–5.6)

## 2023-04-28 LAB — LIPID PANEL
Chol/HDL Ratio: 2.8 {ratio} (ref 0.0–4.4)
Cholesterol, Total: 200 mg/dL — ABNORMAL HIGH (ref 100–199)
HDL: 71 mg/dL (ref >39)
LDL Chol Calc (NIH): 106 mg/dL — ABNORMAL HIGH (ref 0–99)
Triglycerides: 135 mg/dL (ref 0–149)
VLDL Cholesterol Cal: 23 mg/dL (ref 5–40)

## 2023-04-28 LAB — TSH+FREE T4
Free T4: 1.15 ng/dL (ref 0.82–1.77)
TSH: 0.73 u[IU]/mL (ref 0.450–4.500)

## 2023-04-28 LAB — VITAMIN D 25 HYDROXY (VIT D DEFICIENCY, FRACTURES): Vit D, 25-Hydroxy: 44.3 ng/mL (ref 30.0–100.0)

## 2023-06-10 ENCOUNTER — Ambulatory Visit
Admission: RE | Admit: 2023-06-10 | Discharge: 2023-06-10 | Disposition: A | Discharge status: Home / Self Care | Payer: 59 | Source: Ambulatory Visit | Attending: Internal Medicine | Admitting: Internal Medicine

## 2023-06-10 DIAGNOSIS — Z1231 Encounter for screening mammogram for malignant neoplasm of breast: Secondary | ICD-10-CM | POA: Diagnosis present

## 2023-07-15 ENCOUNTER — Other Ambulatory Visit: Payer: Self-pay | Admitting: Internal Medicine

## 2023-07-15 ENCOUNTER — Other Ambulatory Visit: Payer: Self-pay | Admitting: Cardiovascular Disease

## 2023-07-15 DIAGNOSIS — J3089 Other allergic rhinitis: Secondary | ICD-10-CM

## 2023-07-15 NOTE — Telephone Encounter (Signed)
 Requested Prescriptions  Pending Prescriptions Disp Refills   montelukast (SINGULAIR) 10 MG tablet [Pharmacy Med Name: MONTELUKAST SOD 10 MG TABLET] 90 tablet 0    Sig: TAKE 1 TABLET BY MOUTH EVERYDAY AT BEDTIME     Pulmonology:  Leukotriene Inhibitors Passed - 07/15/2023  3:22 PM      Passed - Valid encounter within last 12 months    Recent Outpatient Visits           2 months ago Annual physical exam   [place] [facility], Nyoka Cowden, MD   10 months ago Non-recurrent acute serous otitis media of right ear   [place] [facility] at [facility], Nyoka Cowden, MD   1 year ago Annual physical exam   [facility] at [facility], Nyoka Cowden, MD   1 year ago Aortic atherosclerosis [facility])   [place] [facility] at [facility], Nyoka Cowden, MD   2 years ago Annual physical exam   [facility] at [facility], Nyoka Cowden, MD       Future Appointments             In 2 months Judithann Graves, Nyoka Cowden, MD [facility] at [facility], [facility]   In 9 months Judithann Graves, Nyoka Cowden, MD [facility] at [facility], [facility]

## 2023-09-29 ENCOUNTER — Ambulatory Visit
Admission: RE | Admit: 2023-09-29 | Discharge: 2023-09-29 | Disposition: A | Discharge status: Home / Self Care | Payer: Managed Care, Other (non HMO) | Source: Ambulatory Visit | Attending: Internal Medicine | Admitting: Internal Medicine

## 2023-09-29 ENCOUNTER — Other Ambulatory Visit: Payer: Managed Care, Other (non HMO)

## 2023-09-29 DIAGNOSIS — C3431 Malignant neoplasm of lower lobe, right bronchus or lung: Secondary | ICD-10-CM

## 2023-10-07 ENCOUNTER — Encounter: Payer: Self-pay | Admitting: Internal Medicine

## 2023-10-07 ENCOUNTER — Ambulatory Visit: Payer: Self-pay | Admitting: Internal Medicine

## 2023-10-07 VITALS — BP 116/70 | HR 70 | Ht 70.0 in | Wt 209.2 lb

## 2023-10-07 DIAGNOSIS — R7303 Prediabetes: Secondary | ICD-10-CM | POA: Diagnosis not present

## 2023-10-07 DIAGNOSIS — C3431 Malignant neoplasm of lower lobe, right bronchus or lung: Secondary | ICD-10-CM

## 2023-10-07 DIAGNOSIS — E782 Mixed hyperlipidemia: Secondary | ICD-10-CM

## 2023-10-07 DIAGNOSIS — J3089 Other allergic rhinitis: Secondary | ICD-10-CM

## 2023-10-07 LAB — POCT GLYCOSYLATED HEMOGLOBIN (HGB A1C): Hemoglobin A1C: 5.5 % (ref 4.0–5.6)

## 2023-10-07 MED ORDER — MONTELUKAST SODIUM 10 MG PO TABS: ORAL_TABLET | ORAL | 3 refills | Status: DC

## 2023-10-07 NOTE — Progress Notes (Signed)
 Date:  10/07/2023   Name:  Sharon [name]   DOB:  1960/05/31   MRN:  [id number]   Chief Complaint: Hyperlipidemia and Prediabetes  Hyperlipidemia This is a chronic problem. The problem is controlled. Pertinent negatives include no chest pain or shortness of breath. Current antihyperlipidemic treatment includes statins. The current treatment provides significant improvement of lipids.  Diabetes She presents for her follow-up diabetic visit. Diabetes type: prediabetes. Her disease course has been stable. Pertinent negatives for hypoglycemia include no headaches or tremors. Pertinent negatives for diabetes include no chest pain, no fatigue, no polydipsia and no polyuria.    Review of Systems  Constitutional:  Negative for appetite change, fatigue, fever and unexpected weight change.  HENT:  Negative for tinnitus and trouble swallowing.   Eyes:  Negative for visual disturbance.  Respiratory:  Negative for cough, chest tightness and shortness of breath.   Cardiovascular:  Negative for chest pain, palpitations and leg swelling.  Gastrointestinal:  Negative for abdominal pain.  Endocrine: Negative for polydipsia and polyuria.  Genitourinary:  Negative for dysuria and hematuria.  Musculoskeletal:  Negative for arthralgias.  Neurological:  Negative for tremors, numbness and headaches.  Psychiatric/Behavioral:  Negative for dysphoric mood.      Lab Results  Component Value Date   NA 138 04/16/2023   K 5.0 04/16/2023   CO2 26 04/16/2023   GLUCOSE 105 (H) 04/16/2023   BUN 9 04/16/2023   CREATININE 0.54 04/16/2023   CALCIUM 9.4 04/16/2023   EGFR 100 04/20/2022   GFRNONAA >60 04/16/2023   Lab Results  Component Value Date   CHOL 200 (H) 04/27/2023   HDL 71 04/27/2023   LDLCALC 106 (H) 04/27/2023   TRIG 135 04/27/2023   CHOLHDL 2.8 04/27/2023   Lab Results  Component Value Date   TSH 0.730 04/27/2023   Lab Results  Component Value Date   HGBA1C 5.7 (H) 04/27/2023   Lab  Results  Component Value Date   WBC 8.3 04/16/2023   HGB 13.4 04/16/2023   HCT 40.4 04/16/2023   MCV 96.9 04/16/2023   PLT 293 04/16/2023   Lab Results  Component Value Date   ALT 20 04/16/2023   AST 18 04/16/2023   ALKPHOS 104 04/16/2023   BILITOT 0.5 04/16/2023   Lab Results  Component Value Date   VD25OH 44.3 04/27/2023     Patient Active Problem List   Diagnosis Date Noted   Prediabetes 04/23/2023   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/2022   Primary cancer of right lower lobe of lung (HCC) 08/28/2022   Coronary artery calcification 07/19/2022   RBBB (right bundle branch block) 08/25/2021   Aortic atherosclerosis (HCC) 06/13/2021   Multinodular goiter (nontoxic) 04/08/2020   History of colonic polyps    Mixed hyperlipidemia 03/11/2018   Family history of breast cancer    Vitamin D  deficiency 09/28/2016   Environmental and seasonal allergies 05/22/2016   Acne erythematosa 03/11/2015   Tobacco use disorder, severe, in early remission 03/11/2015    Allergies  Allergen Reactions   Covid-19 (Mrna) Vaccine Swelling   Amoxicillin Itching   Prednisone Other (See Comments)    "Makes me crazy"    Past Surgical History:  Procedure Laterality Date   BRONCHIAL BIOPSY  08/21/2022   Procedure: BRONCHIAL BIOPSIES;  Surgeon: Vergia Glasgow, MD;  Location: [facility];  Service: Pulmonary;;   BRONCHIAL BRUSHINGS  08/21/2022   Procedure: BRONCHIAL BRUSHINGS;  Surgeon: Vergia Glasgow, MD;  Location: [facility] ENDOSCOPY;  Service: Pulmonary;;  BRONCHIAL NEEDLE ASPIRATION BIOPSY  08/21/2022   Procedure: BRONCHIAL NEEDLE ASPIRATION BIOPSIES;  Surgeon: Vergia Glasgow, MD;  Location: [facility] ENDOS[facility];  Service: Pulmonary;;   BRONCHIAL WASHINGS  08/21/2022   Procedure: BRONCHIAL WASHINGS;  Surgeon: Vergia Glasgow, MD;  Location: [facility] END[facility];  Service: Pulmonary;;   BUNIONECTOMY Bilateral    CESAREAN SECTION  1987   COLONOSCOPY WITH PROPOFOL  N/A 03/12/2016   Procedure:  COLONOSCOPY WITH PROPOFOL ;  Surgeon: Marnee Sink, MD;  Location: [facility];  Service: Endoscopy;  Laterality: N/A;   COLONOSCOPY WITH PROPOFOL  N/A 06/05/2019   Procedure: COLONOSCOPY WITH BIOPSIES;  Surgeon: Marnee Sink, MD;  Location: [facility];  Service: Endoscopy;  Laterality: N/A;   INTERCOSTAL NERVE BLOCK Right 09/21/2022   Procedure: INTERCOSTAL NERVE BLOCK;  Surgeon: Hilarie Lovely, MD;  Location: [facility] OR;  Service: Thoracic;  Laterality: Right;   LUMBAR DISC SURGERY     LUNG LOBECTOMY Right 10/2022   NODE DISSECTION Right 09/21/2022   Procedure: NODE DISSECTION;  Surgeon: Hilarie Lovely, MD;  Location: [facility] OR;  Service: Thoracic;  Laterality: Right;   POLYPECTOMY  03/12/2016   Procedure: POLYPECTOMY;  Surgeon: Marnee Sink, MD;  Location: [facility];  Service: Endoscopy;;   POLYPECTOMY N/A 06/05/2019   Procedure: POLYPECTOMY;  Surgeon: Marnee Sink, MD;  Location: [facility];  Service: Endoscopy;  Laterality: N/A;   VIDEO BRONCHOSCOPY WITH ENDOBRONCHIAL ULTRASOUND Right 08/21/2022   Procedure: VIDEO BRONCHOSCOPY WITH ENDOBRONCHIAL ULTRASOUND;  Surgeon: Vergia Glasgow, MD;  Location: [facility] END[facility];  Service: Pulmonary;  Laterality: Right;    Social History   Tobacco Use   Smoking status: Former    Current packs/day: 0.00    Average packs/day: 1 pack/day for 45.0 years (45.0 ttl pk-yrs)    Types: Cigarettes    Start date: 09/03/1977    Quit date: 09/04/2022    Years since quitting: 1.0   Smokeless tobacco: Never  Vaping Use   Vaping status: Never Used  Substance Use Topics   Alcohol use: Yes    Alcohol/week: 6.0 standard drinks of alcohol    Types: 4 Glasses of wine, 2 Standard drinks or equivalent per week   Drug use: No     Medication list has been reviewed and updated.  Current Meds  Medication Sig   acetaminophen  (TYLENOL ) 500 MG tablet Take 1,000 mg by mouth every 6 (six) hours as needed for moderate pain.   alum & mag  hydroxide-simeth (MAALOX/MYLANTA) 200-200-20 MG/5ML suspension Take 30 mLs by mouth every 6 (six) hours as needed for indigestion or heartburn.   Cholecalciferol (VITAMIN D3) 125 MCG (5000 UT) capsule Take 5,000 Units by mouth daily.   Ciclopirox 1 % shampoo Apply 1 Application topically daily as needed (eczema on scalp).   cyanocobalamin (VITAMIN B12) 1000 MCG tablet Take 1,000 mcg by mouth daily.   diphenhydrAMINE  (BENADRYL ) 25 MG tablet Take 25 mg by mouth daily as needed for itching.   doxycycline (PERIOSTAT) 20 MG tablet Take 20-40 mg by mouth See admin instructions. Take 20 mg daily, increase to 40 mg as needed for rosacea flare   ezetimibe  (ZETIA ) 10 MG tablet TAKE 1 TABLET BY MOUTH EVERY DAY   fluocinolone (SYNALAR) 0.01 % external solution Apply 1 Application topically at bedtime as needed (eczema).   loratadine  (CLARITIN ) 10 MG tablet Take 10 mg by mouth daily.   Magnesium  Cl-Calcium Carbonate (SLOW MAGNESIUM /CALCIUM PO) Take 1 tablet by mouth daily.   Multiple Vitamin (MULTIVITAMIN) capsule Take 1 capsule by mouth daily.  pseudoephedrine (SUDAFED) 30 MG tablet Take 30 mg by mouth every 4 (four) hours as needed for congestion.   SKYRIZI PEN 150 MG/ML pen Inject 150 mg as directed once. Every 3 months   triamcinolone  (NASACORT ) 55 MCG/ACT AERO nasal inhaler Place 2 sprays into the nose daily.   [DISCONTINUED] montelukast  (SINGULAIR ) 10 MG tablet TAKE 1 TABLET BY MOUTH EVERYDAY AT BEDTIME       10/07/2023    9:25 AM 04/23/2023    8:19 AM 09/02/2022    9:01 AM 04/20/2022    8:44 AM  GAD 7 : Generalized Anxiety Score  Nervous, Anxious, on Edge 0 0 1 0  Control/stop worrying 0 0 0 0  Worry too much - different things 0 0 1 0  Trouble relaxing 0 0 1 0  Restless 0 0 0 0  Easily annoyed or irritable 0 0 1 0  Afraid - awful might happen 0 0 0 0  Total GAD 7 Score 0 0 4 0  Anxiety Difficulty  Not difficult at all Somewhat difficult Not difficult at all       10/07/2023    9:25 AM  04/23/2023    8:18 AM 09/02/2022    9:01 AM  Depression screen PHQ 2/9  Decreased Interest 0 0 0  Down, Depressed, Hopeless 0 0 0  PHQ - 2 Score 0 0 0  Altered sleeping 0 0 0  Tired, decreased energy 0 0 0  Change in appetite 0 0 0  Feeling bad or failure about yourself  0 0 0  Trouble concentrating 0 0 0  Moving slowly or fidgety/restless 0 0 0  Suicidal thoughts 0 0 0  PHQ-9 Score 0 0 0  Difficult doing work/chores Not difficult at all Not difficult at all Not difficult at all    BP Readings from Last 3 Encounters:  10/07/23 116/70  04/23/23 112/62  04/16/23 (!) 147/77    Physical Exam Vitals and nursing note reviewed.  Constitutional:      General: She is not in acute distress.    Appearance: Normal appearance. She is well-developed.  HENT:     Head: Normocephalic and atraumatic.  Neck:     Vascular: No carotid bruit.  Cardiovascular:     Rate and Rhythm: Normal rate and regular rhythm.  Pulmonary:     Effort: Pulmonary effort is normal. No respiratory distress.     Breath sounds: No wheezing or rhonchi.  Musculoskeletal:     Cervical back: Normal range of motion.     Right lower leg: No edema.     Left lower leg: No edema.  Lymphadenopathy:     Cervical: No cervical adenopathy.  Skin:    General: Skin is warm and dry.     Findings: No rash.  Neurological:     General: No focal deficit present.     Mental Status: She is alert and oriented to person, place, and time.  Psychiatric:        Mood and Affect: Mood normal.        Behavior: Behavior normal.     Wt Readings from Last 3 Encounters:  10/07/23 209 lb 4 oz (94.9 kg)  04/23/23 203 lb (92.1 kg)  04/16/23 200 lb 3.2 oz (90.8 kg)    BP 116/70   Pulse 70   Ht 5\' 10"  (1.778 m)   Wt 209 lb 4 oz (94.9 kg)   SpO2 95%   BMI 30.02 kg/m   Assessment and Plan:  Problem List Items Addressed This Visit       Unprioritized   Mixed hyperlipidemia - Primary (Chronic)   LDL is  Lab Results  Component  Value Date   LDLCALC 106 (H) 04/27/2023   Current regimen is Zetia  and diet.  No medication side effects noted. Goal LDL is <100.       Primary cancer of right lower lobe of lung (HCC) (Chronic)   Doing well s/p resection Seeing Oncology next week CT chest done 2 weeks ago      Environmental and seasonal allergies   Relevant Medications   montelukast  (SINGULAIR ) 10 MG tablet   Prediabetes   Doing well with diet changes. No new concerns. A1C today = 5.5      Relevant Orders   POCT glycosylated hemoglobin (Hb A1C)    No follow-ups on file.    Sheron Dixons, MD [facility] [place]

## 2023-10-07 NOTE — Assessment & Plan Note (Addendum)
 Doing well with diet changes. No new concerns. A1C today = 5.5

## 2023-10-07 NOTE — Assessment & Plan Note (Signed)
 Doing well s/p resection Seeing Oncology next week CT chest done 2 weeks ago

## 2023-10-07 NOTE — Assessment & Plan Note (Signed)
 LDL is  Lab Results  Component Value Date   LDLCALC 106 (H) 04/27/2023   Current regimen is Zetia  and diet.  No medication side effects noted. Goal LDL is <100.

## 2023-10-13 ENCOUNTER — Other Ambulatory Visit: Payer: Self-pay | Admitting: *Deleted

## 2023-10-13 DIAGNOSIS — C3431 Malignant neoplasm of lower lobe, right bronchus or lung: Secondary | ICD-10-CM

## 2023-10-15 ENCOUNTER — Inpatient Hospital Stay: Payer: Managed Care, Other (non HMO) | Attending: Internal Medicine | Admitting: Internal Medicine

## 2023-10-15 ENCOUNTER — Encounter: Payer: Self-pay | Admitting: Internal Medicine

## 2023-10-15 VITALS — BP 121/86 | HR 75 | Temp 96.7°F | Resp 16 | Wt 210.0 lb

## 2023-10-15 DIAGNOSIS — Z87891 Personal history of nicotine dependence: Secondary | ICD-10-CM | POA: Insufficient documentation

## 2023-10-15 DIAGNOSIS — Z85118 Personal history of other malignant neoplasm of bronchus and lung: Secondary | ICD-10-CM | POA: Insufficient documentation

## 2023-10-15 DIAGNOSIS — Z902 Acquired absence of lung [part of]: Secondary | ICD-10-CM | POA: Diagnosis not present

## 2023-10-15 DIAGNOSIS — C3431 Malignant neoplasm of lower lobe, right bronchus or lung: Secondary | ICD-10-CM | POA: Diagnosis present

## 2023-10-15 NOTE — Assessment & Plan Note (Addendum)
#   Adenocarcinoma right lower lobe-clinical stage I [s/p Bronch- Dr.Dgyali]. MAY 13th, 2024- s/p robotic right lobectomy-  pT1b, pN0. Aaron Aas.E. LUNG, RIGHT LOWER LOBE, LOBECTOMY: -  Adenocarcinoma, predominantly acinar type with focal micropapillary pattern (less than  5%) and focal mucinous features. -  1.4 x 1.4 x 1.2 cm. -  Margins negative.-  Negative for lymphovascular and pleural invasion.  pT1b pN0. NO ROLE FOR ADJUVANT THERAPY.  Currently on surveillance.  # CT scan chest contrast- MAY 21st, 2025-  Status post right lower lobectomy without evidence of recurrent or residual disease.  STABLE> Continue surveillance imaging with CT scan every 6 months for the first 2-3 years and then follow-up imaging on an annual basis.  # Smoking: Congratulated the patient on quitting smoking-April 2024.  #Incidental findings on Imaging  CT , 2025 Moderate emphysema; Cholelithiasis with impacted 2.2 cm calculus within the gallbladder neck without superimposed pericholecystic inflammatory change. Stable 2.6 cm benign right adrenal adenoma; Aortic Atherosclerosis [sp evaluation with cards]; I reviewed/discussed/counseled the patient.   Non-contrast CT # DISPOSITION: # Follow up in 6 months- MD; no - labs- Non-contrast CT chest prior-  Dr.B  # I reviewed the blood work- with the patient in detail; also reviewed the imaging independently [as summarized above]; and with the patient in detail.

## 2023-10-15 NOTE — Progress Notes (Signed)
 [name]  Patient Care Team: Sheron Dixons, MD as PCP - General (Internal Medicine) Azalia Leo, MD as Referring Physician (Dermatology) Laney Piper Lela Purple, MD (Obstetrics and Gynecology) Mellody Sprout, MD (Otolaryngology) Drake Gens, RN as Oncology Nurse Navigator Gwyn Leos, MD as Consulting Physician (Oncology)  CHIEF COMPLAINTS/PURPOSE OF CONSULTATION: lung cancer  #  Oncology History Overview Note  FEB 2024- PET scan:1. The posterior right lower lobe pulmonary nodule of concern on recent lung cancer screening chest CT shows no hypermetabolism on PET imaging today. While reassuring, low grade or well differentiated neoplasm can be poorly FDG avid.   APRIL 2024-  [Dr.Dgyali]LUNG, RLL, FINE NEEDLE ASPIRATION:  - Adenocarcinoma  - See comment   B. LUNG, RLL, BRUSHING:  - Adenocarcinoma  - See comment   COMMENT:  Sufficient tissue for molecular testing is present.  Dr. Kavin Parsley  reviewed the case and agrees with the above diagnosis.   LUNG: Resection  Synchronous Tumors: N/A Total Number of Primary Tumors: 1 Procedure: Lobectomy Specimen Laterality: Right Tumor Focality: Unifocal Tumor Site: Right lower lobe Tumor Size: 1.4 x 1.4 x 1.2 cm      Total Tumor Size: Same as above      Invasive Tumor Size (applies only to invasive nonmucinous adenocarcinoma with a lepidic           component): Same as above Histologic Type: Adenocarcinoma, predominantly acinar type with focal mucinous features and rare micropapillary features (less than 5%). Visceral Pleura Invasion: Not identified Direct Invasion of Adjacent Structures: N/A Lymphovascular Invasion: Not identified Margins: All margins negative      Closest Margin(s) to Invasive Carcinoma: Bronchial at 4 cm margin(s) Involved by Invasive Carcinoma: N/A      Margin Status for Non-Invasive Tumor: N/A Treatment Effect: No known presurgical therapy      Percentage  of Residual Viable Tumor: N/A Regional Lymph Nodes:      Number of Lymph Nodes Involved: 0                           Nodal Sites with Tumor: 0      Number of Lymph Nodes Examined: 4                      Nodal Sites Examined: 2 (hilar and station 7) Distant Metastasis:      Distant Site(s) Involved: N/A Pathologic Stage Classification (pTNM, AJCC 8th Edition): pT1b, pN0 Ancillary Studies: Can be performed on clinician request Representative Tumor Block: E1 Comment(s): None ([id number])    Primary cancer of right lower lobe of lung (HCC)  08/28/2022 Initial Diagnosis   Primary cancer of right lower lobe of lung   08/28/2022 Cancer Staging   Staging form: Lung, AJCC 8th Edition - Clinical: Stage IA2 (cT1b, cN0, cM0) - Signed by Gwyn Leos, MD on 08/28/2022      HISTORY OF PRESENTING ILLNESS: Patient ambulating-independently.  Alone.  Sharon [name] 63 y.o.  female history of smoking-currently quit with right lower lobe stage I lung cancer-without any adjuvant therapy is here for follow-up/ and review the results of the CT scan.  Patient still complains of intermittent discomfort in the right ribs otherwise denies any worsening pain. Otherwise denies any unusual shortness of breath or cough.  Denies any fevers or chills.  No nausea no vomiting.   Review of Systems  Constitutional:  Negative for chills, diaphoresis, fever, malaise/fatigue and weight loss.  HENT:  Negative for nosebleeds and sore throat.   Eyes:  Negative for double vision.  Respiratory:  Negative for cough, hemoptysis, sputum production, shortness of breath and wheezing.   Cardiovascular:  Negative for chest pain, palpitations, orthopnea and leg swelling.  Gastrointestinal:  Negative for abdominal pain, blood in stool, constipation, diarrhea, heartburn, melena, nausea and vomiting.  Genitourinary:  Negative for dysuria, frequency and urgency.  Musculoskeletal:  Negative for back pain and joint pain.  Skin:  Negative.  Negative for itching and rash.  Neurological:  Negative for dizziness, tingling, focal weakness, weakness and headaches.  Endo/Heme/Allergies:  Does not bruise/bleed easily.  Psychiatric/Behavioral:  Negative for depression. The patient is not nervous/anxious and does not have insomnia.      MEDICAL HISTORY:  Past Medical History:  Diagnosis Date   Benign neoplasm of ascending colon    BRCA negative 09/2016   My Risk neg   Disease of nail 03/11/2015   Emphysema lung (HCC)    Encounter for nonprocreative genetic counseling    Family history of breast cancer    and pancreatic   Floppy mitral valve    told this "long ago".  No issues   Headache    sinus, seasonal   Increased risk of breast cancer 09/2016   IBIS=17.0%/riskscore=22.4%   Muscle spasms of neck 03/11/2015   Numbness in feet    s/p foot and back surgery   Polyp of sigmoid colon    Polyp of transverse colon    PONV (postoperative nausea and vomiting)    Rosacea    Special screening for malignant neoplasms, colon    Tendinitis of wrist 03/11/2015   Tinnitus 12/2020    SURGICAL HISTORY: Past Surgical History:  Procedure Laterality Date   BRONCHIAL BIOPSY  08/21/2022   Procedure: BRONCHIAL BIOPSIES;  Surgeon: Vergia Glasgow, MD;  Location: [facility];  Service: Pulmonary;;   BRONCHIAL BRUSHINGS  08/21/2022   Procedure: BRONCHIAL BRUSHINGS;  Surgeon: Vergia Glasgow, MD;  Location: [facility];  Service: Pulmonary;;   BRONCHIAL NEEDLE ASPIRATION BIOPSY  08/21/2022   Procedure: BRONCHIAL NEEDLE ASPIRATION BIOPSIES;  Surgeon: Vergia Glasgow, MD;  Location: [facility];  Service: Pulmonary;;   BRONCHIAL WASHINGS  08/21/2022   Procedure: BRONCHIAL WASHINGS;  Surgeon: Vergia Glasgow, MD;  Location: [facility];  Service: Pulmonary;;   BUNIONECTOMY Bilateral    CESAREAN SECTION  1987   COLONOSCOPY WITH PROPOFOL  N/A 03/12/2016   Procedure: COLONOSCOPY WITH PROPOFOL ;  Surgeon: Marnee Sink, MD;  Location:  [facility];  Service: Endoscopy;  Laterality: N/A;   COLONOSCOPY WITH PROPOFOL  N/A 06/05/2019   Procedure: COLONOSCOPY WITH BIOPSIES;  Surgeon: Marnee Sink, MD;  Location: [facility];  Service: Endoscopy;  Laterality: N/A;   INTERCOSTAL NERVE BLOCK Right 09/21/2022   Procedure: INTERCOSTAL NERVE BLOCK;  Surgeon: Hilarie Lovely, MD;  Location: [facility] OR;  Service: Thoracic;  Laterality: Right;   LUMBAR DISC SURGERY     LUNG LOBECTOMY Right 10/2022   NODE DISSECTION Right 09/21/2022   Procedure: NODE DISSECTION;  Surgeon: Hilarie Lovely, MD;  Location: [facility] OR;  Service: Thoracic;  Laterality: Right;   POLYPECTOMY  03/12/2016   Procedure: POLYPECTOMY;  Surgeon: Marnee Sink, MD;  Location: [facility];  Service: Endoscopy;;   POLYPECTOMY N/A 06/05/2019   Procedure: POLYPECTOMY;  Surgeon: Marnee Sink, MD;  Location: [facility];  Service: Endoscopy;  Laterality: N/A;   VIDEO BRONCHOSCOPY WITH ENDOBRONCHIAL ULTRASOUND Right 08/21/2022   Procedure: VIDEO BRONCHOSCOPY WITH ENDOBRONCHIAL ULTRASOUND;  Surgeon:  Vergia Glasgow, MD;  Location: [facility];  Service: Pulmonary;  Laterality: Right;    SOCIAL HISTORY: Social History   Socioeconomic History   Marital status: Married    Spouse name: Alvie Jolly   Number of children: Not on file   Years of education: Not on file   Highest education level: Bachelor's degree (e.g., BA, AB, BS)  Occupational History   Not on file  Tobacco Use   Smoking status: Former    Current packs/day: 0.00    Average packs/day: 1 pack/day for 45.0 years (45.0 ttl pk-yrs)    Types: Cigarettes    Start date: 09/03/1977    Quit date: 09/04/2022    Years since quitting: 1.1   Smokeless tobacco: Never  Vaping Use   Vaping status: Never Used  Substance and Sexual Activity   Alcohol use: Yes    Alcohol/week: 6.0 standard drinks of alcohol    Types: 4 Glasses of wine, 2 Standard drinks or equivalent per week   Drug use: No   Sexual  activity: Yes  Other Topics Concern   Not on file  Social History Narrative   Not on file   Social Drivers of Health   Financial Resource Strain: Low Risk  (10/06/2023)   Overall Financial Resource Strain (CARDIA)    Difficulty of Paying Living Expenses: Not hard at all  Food Insecurity: No Food Insecurity (10/06/2023)   Hunger Vital Sign    Worried About Running Out of Food in the Last Year: Never true    Ran Out of Food in the Last Year: Never true  Transportation Needs: No Transportation Needs (10/06/2023)   PRAPARE - Administrator, Civil Service (Medical): No    Lack of Transportation (Non-Medical): No  Physical Activity: Insufficiently Active (10/06/2023)   Exercise Vital Sign    Days of Exercise per Week: 2 days    Minutes of Exercise per Session: 10 min  Stress: No Stress Concern Present (10/06/2023)   [employer] of Occupational Health - Occupational Stress Questionnaire    Feeling of Stress : Not at all  Social Connections: Moderately Integrated (10/06/2023)   Social Connection and Isolation Panel [NHANES]    Frequency of Communication with Friends and Family: Three times a week    Frequency of Social Gatherings with Friends and Family: Twice a week    Attends Religious Services: 1 to 4 times per year    Active Member of [employer] or Organizations: No    Attends Engineer, structural: Not on file    Marital Status: Married  Catering manager Violence: Not on file    FAMILY HISTORY: Family History  Problem Relation Age of Onset   COPD Mother    Pulmonary fibrosis Mother    Heart attack Father    Heart disease Father    Pancreatic cancer Maternal Uncle 60   Breast cancer Maternal Grandmother 60       twice, bilat breast--88   Breast cancer Other    Breast cancer Other    Ovarian cancer Other     ALLERGIES:  is allergic to covid-19 (mrna) vaccine, amoxicillin, and prednisone.  MEDICATIONS:  Current Outpatient Medications  Medication Sig  Dispense Refill   acetaminophen  (TYLENOL ) 500 MG tablet Take 1,000 mg by mouth every 6 (six) hours as needed for moderate pain.     alum & mag hydroxide-simeth (MAALOX/MYLANTA) 200-200-20 MG/5ML suspension Take 30 mLs by mouth every 6 (six) hours as needed for indigestion or heartburn.  Cholecalciferol (VITAMIN D3) 125 MCG (5000 UT) capsule Take 5,000 Units by mouth daily.     Ciclopirox 1 % shampoo Apply 1 Application topically daily as needed (eczema on scalp).     cyanocobalamin (VITAMIN B12) 1000 MCG tablet Take 1,000 mcg by mouth daily.     diphenhydrAMINE  (BENADRYL ) 25 MG tablet Take 25 mg by mouth daily as needed for itching.     doxycycline (PERIOSTAT) 20 MG tablet Take 20-40 mg by mouth See admin instructions. Take 20 mg daily, increase to 40 mg as needed for rosacea flare     ezetimibe  (ZETIA ) 10 MG tablet TAKE 1 TABLET BY MOUTH EVERY DAY 90 tablet 3   fluocinolone (SYNALAR) 0.01 % external solution Apply 1 Application topically at bedtime as needed (eczema).     loratadine  (CLARITIN ) 10 MG tablet Take 10 mg by mouth daily.     Magnesium  Cl-Calcium Carbonate (SLOW MAGNESIUM /CALCIUM PO) Take 1 tablet by mouth daily.     montelukast  (SINGULAIR ) 10 MG tablet TAKE 1 TABLET BY MOUTH EVERYDAY AT BEDTIME 90 tablet 3   Multiple Vitamin (MULTIVITAMIN) capsule Take 1 capsule by mouth daily.     SKYRIZI PEN 150 MG/ML pen Inject 150 mg as directed once. Every 3 months     triamcinolone  (NASACORT ) 55 MCG/ACT AERO nasal inhaler Place 2 sprays into the nose daily.     lidocaine  (XYLOCAINE ) 5 % ointment Apply 1 Application topically 3 (three) times daily as needed. (Patient not taking: Reported on 10/15/2023) 35.44 g 1   pseudoephedrine (SUDAFED) 30 MG tablet Take 30 mg by mouth every 4 (four) hours as needed for congestion. (Patient not taking: Reported on 10/15/2023)     No current facility-administered medications for this visit.      PHYSICAL EXAMINATION:   Vitals:   10/15/23 1327  BP:  121/86  Pulse: 75  Resp: 16  Temp: (!) 96.7 F (35.9 C)  SpO2: 98%     Filed Weights   10/15/23 1327  Weight: 210 lb (95.3 kg)      Physical Exam Vitals and nursing note reviewed.  HENT:     Head: Normocephalic and atraumatic.     Mouth/Throat:     Pharynx: Oropharynx is clear.  Eyes:     Extraocular Movements: Extraocular movements intact.     Pupils: Pupils are equal, round, and reactive to light.  Cardiovascular:     Rate and Rhythm: Normal rate and regular rhythm.  Pulmonary:     Comments: Decreased breath sounds bilaterally.  Abdominal:     Palpations: Abdomen is soft.  Musculoskeletal:        General: Normal range of motion.     Cervical back: Normal range of motion.  Skin:    General: Skin is warm.  Neurological:     General: No focal deficit present.     Mental Status: She is alert and oriented to person, place, and time.  Psychiatric:        Behavior: Behavior normal.        Judgment: Judgment normal.      LABORATORY DATA:  I have reviewed the data as listed Lab Results  Component Value Date   WBC 8.3 04/16/2023   HGB 13.4 04/16/2023   HCT 40.4 04/16/2023   MCV 96.9 04/16/2023   PLT 293 04/16/2023   Recent Labs    04/16/23 1256  NA 138  K 5.0  CL 104  CO2 26  GLUCOSE 105*  BUN 9  CREATININE 0.54  CALCIUM 9.4  GFRNONAA >60  PROT 6.8  ALBUMIN  3.9  AST 18  ALT 20  ALKPHOS 104  BILITOT 0.5    RADIOGRAPHIC STUDIES: I have personally reviewed the radiological images as listed and agreed with the findings in the report. CT Chest Wo Contrast Result Date: 10/10/2023 CLINICAL DATA:  Non-small cell lung cancer (NSCLC), metastatic, assess treatment response. * Tracking Code: BO * EXAM: CT CHEST WITHOUT CONTRAST TECHNIQUE: Multidetector CT imaging of the chest was performed following the standard protocol without IV contrast. RADIATION DOSE REDUCTION: This exam was performed according to the departmental dose-optimization program which  includes automated exposure control, adjustment of the mA and/or kV according to patient size and/or use of iterative reconstruction technique. COMPARISON:  03/30/2023 FINDINGS: Cardiovascular: Mild coronary artery calcification. Global cardiac size within normal limits. No pericardial effusion. Central pulmonary arteries are of normal caliber. Mild atherosclerotic calcification within the thoracic aorta. No aortic aneurysm. Mediastinum/Nodes: Mild thyroid  goiter. No pathologic thoracic adenopathy. Esophagus is unremarkable. Small hernia. Lungs/Pleura: Moderate emphysema. Status post right lower lobectomy. Stable scattered subpleural pulmonary nodules, largest within the right upper lobe measuring up to 6 mm (87/3) since remote prior examination of 03/21/2020, safely considered benign. No new focal pulmonary nodules or infiltrates. No pneumothorax or pleural effusion. Stable narrowing of the bronchus intermedius (72/3). Upper Abdomen: Cholelithiasis with impacted 2.2 cm calculus within the gallbladder neck without superimposed pericholecystic inflammatory change. Stable 2.6 cm benign right adrenal adenoma. No acute abnormality. Musculoskeletal: No acute bone abnormality. No lytic or blastic bone lesion. Osseous structures are age appropriate. IMPRESSION: 1. Status post right lower lobectomy without evidence of recurrent or residual disease. 2. Moderate emphysema. 3. Mild coronary artery calcification. 4. Cholelithiasis with impacted 2.2 cm calculus within the gallbladder neck without superimposed pericholecystic inflammatory change. 5. Stable 2.6 cm benign right adrenal adenoma. Aortic Atherosclerosis (ICD10-I70.0) and Emphysema (ICD10-J43.9). Electronically Signed   By: Worthy Heads M.D.   On: 10/10/2023 22:10    ASSESSMENT & PLAN:   Primary cancer of right lower lobe of lung (HCC) # Adenocarcinoma right lower lobe-clinical stage I [s/p Bronch- Dr.Dgyali]. MAY 13th, 2024- s/p robotic right lobectomy-  pT1b,  pN0. Aaron Aas.E. LUNG, RIGHT LOWER LOBE, LOBECTOMY: -  Adenocarcinoma, predominantly acinar type with focal micropapillary pattern (less than  5%) and focal mucinous features. -  1.4 x 1.4 x 1.2 cm. -  Margins negative.-  Negative for lymphovascular and pleural invasion.  pT1b pN0. NO ROLE FOR ADJUVANT THERAPY.  Currently on surveillance.  # CT scan chest contrast- MAY 21st, 2025-  Status post right lower lobectomy without evidence of recurrent or residual disease.  STABLE> Continue surveillance imaging with CT scan every 6 months for the first 2-3 years and then follow-up imaging on an annual basis.  # Smoking: Congratulated the patient on quitting smoking-April 2024.  #Incidental findings on Imaging  CT , 2025 Moderate emphysema; Cholelithiasis with impacted 2.2 cm calculus within the gallbladder neck without superimposed pericholecystic inflammatory change. Stable 2.6 cm benign right adrenal adenoma; Aortic Atherosclerosis [sp evaluation with cards]; I reviewed/discussed/counseled the patient.   Non-contrast CT # DISPOSITION: # Follow up in 6 months- MD; no - labs- Non-contrast CT chest prior-  Dr.B  # I reviewed the blood work- with the patient in detail; also reviewed the imaging independently [as summarized above]; and with the patient in detail.     All questions were answered. The patient knows to call the clinic with any problems, questions or concerns.    Sharon [name]  Sharon Heard, MD 10/15/2023 1:49 PM

## 2023-10-15 NOTE — Progress Notes (Signed)
 Pt doing well, denies any concerns.

## 2023-10-18 ENCOUNTER — Ambulatory Visit (INDEPENDENT_AMBULATORY_CARE_PROVIDER_SITE_OTHER): Admitting: Student in an Organized Health Care Education/Training Program

## 2023-10-18 ENCOUNTER — Encounter: Payer: Self-pay | Admitting: Student in an Organized Health Care Education/Training Program

## 2023-10-18 VITALS — BP 100/70 | HR 67 | Temp 98.0°F | Ht 70.0 in | Wt 210.0 lb

## 2023-10-18 DIAGNOSIS — C3491 Malignant neoplasm of unspecified part of right bronchus or lung: Secondary | ICD-10-CM | POA: Diagnosis not present

## 2023-10-18 DIAGNOSIS — Z87891 Personal history of nicotine dependence: Secondary | ICD-10-CM

## 2023-10-18 NOTE — Progress Notes (Unsigned)
 Synopsis: Referred in *** by Sheron Dixons, MD  Assessment & Plan:   There are no diagnoses linked to this encounter.  Doing well, no need for follow up. No SOB, neuropathic pain resolved. Return to LDCT for lung cancer screening once surveillance is complete  Return if symptoms worsen or fail to improve.  I spent *** minutes caring for this patient today, including {EM billing:[id number]}  Vergia Glasgow, MD [facility] 10/18/2023 2:29 PM    End of visit medications:  No orders of the defined types were placed in this encounter.    Current Outpatient Medications:    acetaminophen  (TYLENOL ) 500 MG tablet, Take 1,000 mg by mouth every 6 (six) hours as needed for moderate pain., Disp: , Rfl:    alum & mag hydroxide-simeth (MAALOX/MYLANTA) 200-200-20 MG/5ML suspension, Take 30 mLs by mouth every 6 (six) hours as needed for indigestion or heartburn., Disp: , Rfl:    Cholecalciferol (VITAMIN D3) 125 MCG (5000 UT) capsule, Take 5,000 Units by mouth daily., Disp: , Rfl:    Ciclopirox 1 % shampoo, Apply 1 Application topically daily as needed (eczema on scalp)., Disp: , Rfl:    cyanocobalamin (VITAMIN B12) 1000 MCG tablet, Take 1,000 mcg by mouth daily., Disp: , Rfl:    diphenhydrAMINE  (BENADRYL ) 25 MG tablet, Take 25 mg by mouth daily as needed for itching., Disp: , Rfl:    doxycycline (PERIOSTAT) 20 MG tablet, Take 20-40 mg by mouth See admin instructions. Take 20 mg daily, increase to 40 mg as needed for rosacea flare, Disp: , Rfl:    ezetimibe  (ZETIA ) 10 MG tablet, TAKE 1 TABLET BY MOUTH EVERY DAY, Disp: 90 tablet, Rfl: 3   fluocinolone (SYNALAR) 0.01 % external solution, Apply 1 Application topically at bedtime as needed (eczema)., Disp: , Rfl:    loratadine  (CLARITIN ) 10 MG tablet, Take 10 mg by mouth daily., Disp: , Rfl:    Magnesium  Cl-Calcium Carbonate (SLOW MAGNESIUM /CALCIUM PO), Take 1 tablet by mouth daily., Disp: , Rfl:    montelukast  (SINGULAIR ) 10 MG  tablet, TAKE 1 TABLET BY MOUTH EVERYDAY AT BEDTIME, Disp: 90 tablet, Rfl: 3   Multiple Vitamin (MULTIVITAMIN) capsule, Take 1 capsule by mouth daily., Disp: , Rfl:    SKYRIZI PEN 150 MG/ML pen, Inject 150 mg as directed once. Every 3 months, Disp: , Rfl:    triamcinolone  (NASACORT ) 55 MCG/ACT AERO nasal inhaler, Place 2 sprays into the nose daily., Disp: , Rfl:    lidocaine  (XYLOCAINE ) 5 % ointment, Apply 1 Application topically 3 (three) times daily as needed. (Patient not taking: Reported on 10/07/2023), Disp: 35.44 g, Rfl: 1   pseudoephedrine (SUDAFED) 30 MG tablet, Take 30 mg by mouth every 4 (four) hours as needed for congestion. (Patient not taking: Reported on 10/15/2023), Disp: , Rfl:    Subjective:   PATIENT ID: Sharon [name] GENDER: female DOB: 01-15-1961, MRN: [id number]  Chief Complaint  Patient presents with   Follow-up    29yr follow up, CT: 5/21, previous lung nodule  No complaints, some residual allergies with an occasional cough.       HPI ***  Ancillary information including prior medications, full medical/surgical/family/social histories, and PFTs (when available) are listed below and have been reviewed.   ROS   Objective:   Vitals:   10/18/23 1411  BP: 100/70  Pulse: 67  Temp: 98 F (36.7 C)  TempSrc: Oral  SpO2: 97%  Weight: 210 lb (95.3 kg)  Height: 5\' 10"  (1.778 m)  97% on *** LPM *** RA BMI Readings from Last 3 Encounters:  10/18/23 30.13 kg/m  10/15/23 30.13 kg/m  10/07/23 30.02 kg/m   Wt Readings from Last 3 Encounters:  10/18/23 210 lb (95.3 kg)  10/15/23 210 lb (95.3 kg)  10/07/23 209 lb 4 oz (94.9 kg)    Physical Exam    Ancillary Information    Past Medical History:  Diagnosis Date   Benign neoplasm of ascending colon    BRCA negative 09/2016   My Risk neg   Disease of nail 03/11/2015   Emphysema lung (HCC)    Encounter for nonprocreative genetic counseling    Family history of breast cancer    and pancreatic   Floppy  mitral valve    told this "long ago".  No issues   Headache    sinus, seasonal   Increased risk of breast cancer 09/2016   IBIS=17.0%/riskscore=22.4%   Muscle spasms of neck 03/11/2015   Numbness in feet    s/p foot and back surgery   Polyp of sigmoid colon    Polyp of transverse colon    PONV (postoperative nausea and vomiting)    Rosacea    Special screening for malignant neoplasms, colon    Tendinitis of wrist 03/11/2015   Tinnitus 12/2020     Family History  Problem Relation Age of Onset   COPD Mother    Pulmonary fibrosis Mother    Heart attack Father    Heart disease Father    Pancreatic cancer Maternal Uncle 35   Breast cancer Maternal Grandmother 60       twice, bilat breast--88   Breast cancer Other    Breast cancer Other    Ovarian cancer Other      Past Surgical History:  Procedure Laterality Date   BRONCHIAL BIOPSY  08/21/2022   Procedure: BRONCHIAL BIOPSIES;  Surgeon: Vergia Glasgow, MD;  [facility];  Service: Pulmonary;;   BRONCHIAL BRUSHINGS  08/21/2022   Procedure: BRONCHIAL BRUSHINGS;  Surgeon: Vergia Glasgow, MD;  [facility];  Service: Pulmonary;;   BRONCHIAL NEEDLE ASPIRATION BIOPSY  08/21/2022   Procedure: BRONCHIAL NEEDLE ASPIRATION BIOPSIES;  Surgeon: Vergia Glasgow, MD;  [facility];  Service: Pulmonary;;   BRONCHIAL WASHINGS  08/21/2022   Procedure: BRONCHIAL WASHINGS;  Surgeon: Vergia Glasgow, MD;  [facility];  Service: Pulmonary;;   BUNIONECTOMY Bilateral    CESAREAN SECTION  1987   COLONOSCOPY WITH PROPOFOL  N/A 03/12/2016   Procedure: COLONOSCOPY WITH PROPOFOL ;  Surgeon: Marnee Sink, MD;  Location: [facility];  Service: Endoscopy;  Laterality: N/A;   COLONOSCOPY WITH PROPOFOL  N/A 06/05/2019   Procedure: COLONOSCOPY WITH BIOPSIES;  Surgeon: Marnee Sink, MD;  Location: [facility];  Service: Endoscopy;  Laterality: N/A;   INTERCOSTAL NERVE BLOCK Right 09/21/2022   Procedure:  INTERCOSTAL NERVE BLOCK;  Surgeon: Hilarie Lovely, MD;  Location: [facility];  Service: Thoracic;  Laterality: Right;   LUMBAR DISC SURGERY     LUNG LOBECTOMY Right 10/2022   NODE DISSECTION Right 09/21/2022   Procedure: NODE DISSECTION;  Surgeon: Hilarie Lovely, MD;  [facility];  Service: Thoracic;  Laterality: Right;   POLYPECTOMY  03/12/2016   Procedure: POLYPECTOMY;  Surgeon: Marnee Sink, MD;  Location: [facility];  Service: Endoscopy;;   POLYPECTOMY N/A 06/05/2019   Procedure: POLYPECTOMY;  Surgeon: Marnee Sink, MD;  Location: [facility];  Service: Endoscopy;  Laterality: N/A;   VIDEO BRONCHOSCOPY WITH ENDOBRONCHIAL ULTRASOUND Right 08/21/2022  Procedure: VIDEO BRONCHOSCOPY WITH ENDOBRONCHIAL ULTRASOUND;  Surgeon: Vergia Glasgow, MD;  [facility];  Service: Pulmonary;  Laterality: Right;    Social History   Socioeconomic History   Marital status: Married    Spouse name: Alvie Jolly   Number of children: Not on file   Years of education: Not on file   Highest education level: Bachelor's degree (e.g., BA, AB, BS)  Occupational History   Not on file  Tobacco Use   Smoking status: Former    Current packs/day: 0.00    Average packs/day: 1 pack/day for 45.0 years (45.0 ttl pk-yrs)    Types: Cigarettes    Start date: 09/03/1977    Quit date: 09/04/2022    Years since quitting: 1.1   Smokeless tobacco: Never  Vaping Use   Vaping status: Never Used  Substance and Sexual Activity   Alcohol use: Yes    Alcohol/week: 6.0 standard drinks of alcohol    Types: 4 Glasses of wine, 2 Standard drinks or equivalent per week   Drug use: No   Sexual activity: Yes  Other Topics Concern   Not on file  Social History Narrative   Not on file   Social Drivers of Health   Financial Resource Strain: Low Risk  (10/06/2023)   Overall Financial Resource Strain (CARDIA)    Difficulty of Paying Living Expenses: Not hard at all  Food Insecurity: No Food Insecurity  (10/06/2023)   Hunger Vital Sign    Worried About Running Out of Food in the Last Year: Never true    Ran Out of Food in the Last Year: Never true  Transportation Needs: No Transportation Needs (10/06/2023)   PRAPARE - Administrator, Civil Service (Medical): No    Lack of Transportation (Non-Medical): No  Physical Activity: Insufficiently Active (10/06/2023)   Exercise Vital Sign    Days of Exercise per Week: 2 days    Minutes of Exercise per Session: 10 min  Stress: No Stress Concern Present (10/06/2023)   [employer] of Occupational Health - Occupational Stress Questionnaire    Feeling of Stress : Not at all  Social Connections: Moderately Integrated (10/06/2023)   Social Connection and Isolation Panel [NHANES]    Frequency of Communication with Friends and Family: Three times a week    Frequency of Social Gatherings with Friends and Family: Twice a week    Attends Religious Services: 1 to 4 times per year    Active Member of Clubs or Organizations: No    Attends Engineer, structural: Not on file    Marital Status: Married  Catering manager Violence: Not on file     Allergies  Allergen Reactions   Covid-19 (Mrna) Vaccine Swelling   Amoxicillin Itching   Prednisone Other (See Comments)    "Makes me crazy"     CBC    Component Value Date/Time   WBC 8.3 04/16/2023 1256   WBC 13.7 (H) 09/23/2022 0026   RBC 4.17 04/16/2023 1256   HGB 13.4 04/16/2023 1256   HGB 14.7 04/20/2022 0953   HCT 40.4 04/16/2023 1256   HCT 44.0 04/20/2022 0953   PLT 293 04/16/2023 1256   PLT 322 04/20/2022 0953   MCV 96.9 04/16/2023 1256   MCV 98 (H) 04/20/2022 0953   MCH 32.1 04/16/2023 1256   MCHC 33.2 04/16/2023 1256   RDW 12.4 04/16/2023 1256   RDW 11.3 (L) 04/20/2022 0953   LYMPHSABS 1.9 04/16/2023 1256   LYMPHSABS 2.5 04/20/2022 5852  MONOABS 0.6 04/16/2023 1256   EOSABS 0.3 04/16/2023 1256   EOSABS 0.3 04/20/2022 0953   BASOSABS 0.1 04/16/2023 1256   BASOSABS  0.1 04/20/2022 0953    Pulmonary Functions Testing Results:    Latest Ref Rng & Units 09/01/2022    2:28 PM  PFT Results  FVC-Pre L 2.92   FVC-Predicted Pre % 72   FVC-Post L 3.16   FVC-Predicted Post % 78   Pre FEV1/FVC % % 86   Post FEV1/FCV % % 77   FEV1-Pre L 2.52   FEV1-Predicted Pre % 80   FEV1-Post L 2.45   DLCO uncorrected ml/min/mmHg 16.71   DLCO UNC% % 68   DLVA Predicted % 81   TLC L 5.23   TLC % Predicted % 87   RV % Predicted % 88     Outpatient Medications Prior to Visit  Medication Sig Dispense Refill   acetaminophen  (TYLENOL ) 500 MG tablet Take 1,000 mg by mouth every 6 (six) hours as needed for moderate pain.     alum & mag hydroxide-simeth (MAALOX/MYLANTA) 200-200-20 MG/5ML suspension Take 30 mLs by mouth every 6 (six) hours as needed for indigestion or heartburn.     Cholecalciferol (VITAMIN D3) 125 MCG (5000 UT) capsule Take 5,000 Units by mouth daily.     Ciclopirox 1 % shampoo Apply 1 Application topically daily as needed (eczema on scalp).     cyanocobalamin (VITAMIN B12) 1000 MCG tablet Take 1,000 mcg by mouth daily.     diphenhydrAMINE  (BENADRYL ) 25 MG tablet Take 25 mg by mouth daily as needed for itching.     doxycycline (PERIOSTAT) 20 MG tablet Take 20-40 mg by mouth See admin instructions. Take 20 mg daily, increase to 40 mg as needed for rosacea flare     ezetimibe  (ZETIA ) 10 MG tablet TAKE 1 TABLET BY MOUTH EVERY DAY 90 tablet 3   fluocinolone (SYNALAR) 0.01 % external solution Apply 1 Application topically at bedtime as needed (eczema).     loratadine  (CLARITIN ) 10 MG tablet Take 10 mg by mouth daily.     Magnesium  Cl-Calcium Carbonate (SLOW MAGNESIUM /CALCIUM PO) Take 1 tablet by mouth daily.     montelukast  (SINGULAIR ) 10 MG tablet TAKE 1 TABLET BY MOUTH EVERYDAY AT BEDTIME 90 tablet 3   Multiple Vitamin (MULTIVITAMIN) capsule Take 1 capsule by mouth daily.     SKYRIZI PEN 150 MG/ML pen Inject 150 mg as directed once. Every 3 months      triamcinolone  (NASACORT ) 55 MCG/ACT AERO nasal inhaler Place 2 sprays into the nose daily.     lidocaine  (XYLOCAINE ) 5 % ointment Apply 1 Application topically 3 (three) times daily as needed. (Patient not taking: Reported on 10/07/2023) 35.44 g 1   pseudoephedrine (SUDAFED) 30 MG tablet Take 30 mg by mouth every 4 (four) hours as needed for congestion. (Patient not taking: Reported on 10/15/2023)     No facility-administered medications prior to visit.

## 2024-03-27 ENCOUNTER — Telehealth: Payer: Self-pay

## 2024-03-27 NOTE — Telephone Encounter (Signed)
 Received call from [facility] that patient is scheduled for CT scan 04/11/24.  The insurance auth expired on 03/19/24 and a new on will need to be obtained.  Message sent to [facility] authorization department.

## 2024-04-11 ENCOUNTER — Ambulatory Visit
Admission: RE | Admit: 2024-04-11 | Discharge: 2024-04-11 | Disposition: A | Source: Ambulatory Visit | Attending: Internal Medicine

## 2024-04-11 DIAGNOSIS — C3431 Malignant neoplasm of lower lobe, right bronchus or lung: Secondary | ICD-10-CM

## 2024-04-24 ENCOUNTER — Ambulatory Visit: Payer: Self-pay | Admitting: Internal Medicine

## 2024-04-24 ENCOUNTER — Encounter: Payer: Managed Care, Other (non HMO) | Admitting: Internal Medicine

## 2024-04-24 ENCOUNTER — Other Ambulatory Visit: Payer: Self-pay

## 2024-04-24 ENCOUNTER — Encounter: Payer: Self-pay | Admitting: Internal Medicine

## 2024-04-24 VITALS — BP 124/72 | HR 72 | Ht 70.0 in | Wt 218.0 lb

## 2024-04-24 DIAGNOSIS — R7303 Prediabetes: Secondary | ICD-10-CM

## 2024-04-24 DIAGNOSIS — Z85118 Personal history of other malignant neoplasm of bronchus and lung: Secondary | ICD-10-CM

## 2024-04-24 DIAGNOSIS — Z1231 Encounter for screening mammogram for malignant neoplasm of breast: Secondary | ICD-10-CM | POA: Diagnosis not present

## 2024-04-24 DIAGNOSIS — Z1211 Encounter for screening for malignant neoplasm of colon: Secondary | ICD-10-CM

## 2024-04-24 DIAGNOSIS — E782 Mixed hyperlipidemia: Secondary | ICD-10-CM | POA: Diagnosis not present

## 2024-04-24 DIAGNOSIS — E042 Nontoxic multinodular goiter: Secondary | ICD-10-CM | POA: Diagnosis not present

## 2024-04-24 DIAGNOSIS — Z0000 Encounter for general adult medical examination without abnormal findings: Secondary | ICD-10-CM

## 2024-04-24 NOTE — Progress Notes (Signed)
 Date:  04/24/2024   Name:  Sharon [name]   DOB:  04/08/1961   MRN:  [id number]   Chief Complaint: Annual Exam Sharon [name] is a 63 y.o. female who presents today for her Complete Annual Exam. She feels well. She reports exercising some. She reports she is sleeping fairly well. Breast complaints none. Health Maintenance  Topic Date Due   HIV Screening  Never done   Colon Cancer Screening  06/04/2024   Breast Cancer Screening  06/09/2024   Flu Shot  08/08/2024*   DTaP/Tdap/Td vaccine (2 - Td or Tdap) 06/13/2024   Pap with HPV screening  04/08/2025   Pneumococcal Vaccine for age over 68  Completed   Hepatitis C Screening  Completed   Zoster (Shingles) Vaccine  Completed   Hepatitis B Vaccine  Aged Out   HPV Vaccine  Aged Out   Meningitis B Vaccine  Aged Out   Screening for Lung Cancer  Discontinued   COVID-19 Vaccine  Discontinued  *Topic was postponed. The date shown is not the original due date.   HPI  Review of Systems  Constitutional:  Negative for fatigue and unexpected weight change.  HENT:  Negative for trouble swallowing.   Eyes:  Negative for visual disturbance.  Respiratory:  Negative for cough, chest tightness, shortness of breath and wheezing.   Cardiovascular:  Negative for chest pain, palpitations and leg swelling.  Gastrointestinal:  Negative for abdominal pain, constipation and diarrhea.  Musculoskeletal:  Negative for arthralgias and myalgias.  Neurological:  Negative for dizziness, weakness, light-headedness and headaches.     Lab Results  Component Value Date   NA 138 04/16/2023   K 5.0 04/16/2023   CO2 26 04/16/2023   GLUCOSE 105 (H) 04/16/2023   BUN 9 04/16/2023   CREATININE 0.54 04/16/2023   CALCIUM 9.4 04/16/2023   EGFR 100 04/20/2022   GFRNONAA >60 04/16/2023   Lab Results  Component Value Date   CHOL 200 (H) 04/27/2023   HDL 71 04/27/2023   LDLCALC 106 (H) 04/27/2023   TRIG 135 04/27/2023   CHOLHDL 2.8 04/27/2023   Lab Results   Component Value Date   TSH 0.730 04/27/2023   Lab Results  Component Value Date   HGBA1C 5.5 10/07/2023   Lab Results  Component Value Date   WBC 8.3 04/16/2023   HGB 13.4 04/16/2023   HCT 40.4 04/16/2023   MCV 96.9 04/16/2023   PLT 293 04/16/2023   Lab Results  Component Value Date   ALT 20 04/16/2023   AST 18 04/16/2023   ALKPHOS 104 04/16/2023   BILITOT 0.5 04/16/2023   Lab Results  Component Value Date   VD25OH 44.3 04/27/2023     Patient Active Problem List   Diagnosis Date Noted   Prediabetes 04/23/2023   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/2022   History of lung cancer 08/28/2022   Coronary artery calcification 07/19/2022   RBBB (right bundle branch block) 08/25/2021   Aortic atherosclerosis 06/13/2021   Multinodular goiter (nontoxic) 04/08/2020   History of colonic polyps    Mixed hyperlipidemia 03/11/2018   Family history of breast cancer    Vitamin D  deficiency 09/28/2016   Environmental and seasonal allergies 05/22/2016   Acne erythematosa 03/11/2015   Tobacco use disorder, severe, in early remission 03/11/2015    Allergies[1]  Past Surgical History:  Procedure Laterality Date   BRONCHIAL BIOPSY  08/21/2022   Procedure: BRONCHIAL BIOPSIES;  Surgeon: Isadora Hose, MD;  [facility]: [facility];  Service: Pulmonary;;   BRONCHIAL BRUSHINGS  08/21/2022   Procedure: BRONCHIAL BRUSHINGS;  Surgeon: Isadora Hose, MD;  Location: [facility];  Service: Pulmonary;;   BRONCHIAL NEEDLE ASPIRATION BIOPSY  08/21/2022   Procedure: BRONCHIAL NEEDLE ASPIRATION BIOPSIES;  Surgeon: Isadora Hose, MD;  [facility]: [facility];  Service: Pulmonary;;   BRONCHIAL WASHINGS  08/21/2022   Procedure: BRONCHIAL WASHINGS;  Surgeon: Isadora Hose, MD;  [facility];  Service: Pulmonary;;   BUNIONECTOMY Bilateral    CESAREAN SECTION  1987   COLONOSCOPY WITH PROPOFOL  N/A 03/12/2016   Procedure: COLONOSCOPY WITH PROPOFOL ;  Surgeon: Rogelia Copping,  MD;  Location: [facility];  Service: Endoscopy;  Laterality: N/A;   COLONOSCOPY WITH PROPOFOL  N/A 06/05/2019   Procedure: COLONOSCOPY WITH BIOPSIES;  Surgeon: Copping Rogelia, MD;  Location: [facility];  Service: Endoscopy;  Laterality: N/A;   INTERCOSTAL NERVE BLOCK Right 09/21/2022   Procedure: INTERCOSTAL NERVE BLOCK;  Surgeon: Shyrl Linnie KIDD, MD;  Location: [facility];  Service: Thoracic;  Laterality: Right;   LUMBAR DISC SURGERY     LUNG LOBECTOMY Right 10/2022   NODE DISSECTION Right 09/21/2022   Procedure: NODE DISSECTION;  Surgeon: Shyrl Linnie KIDD, MD;  Location: [facility];  Service: Thoracic;  Laterality: Right;   POLYPECTOMY  03/12/2016   Procedure: POLYPECTOMY;  Surgeon: Rogelia Copping, MD;  Location: [facility];  Service: Endoscopy;;   POLYPECTOMY N/A 06/05/2019   Procedure: POLYPECTOMY;  Surgeon: Copping Rogelia, MD;  Location: [facility];  Service: Endoscopy;  Laterality: N/A;   VIDEO BRONCHOSCOPY WITH ENDOBRONCHIAL ULTRASOUND Right 08/21/2022   Procedure: VIDEO BRONCHOSCOPY WITH ENDOBRONCHIAL ULTRASOUND;  Surgeon: Isadora Hose, MD;  [facility];  Service: Pulmonary;  Laterality: Right;    Social History[2]   Medication list has been reviewed and updated.  Active Medications[3]     10/07/2023    9:25 AM 04/23/2023    8:19 AM 09/02/2022    9:01 AM 04/20/2022    8:44 AM  GAD 7 : Generalized Anxiety Score  Nervous, Anxious, on Edge 0 0 1 0  Control/stop worrying 0 0 0 0  Worry too much - different things 0 0 1 0  Trouble relaxing 0 0 1 0  Restless 0 0 0 0  Easily annoyed or irritable 0 0 1 0  Afraid - awful might happen 0 0 0 0  Total GAD 7 Score 0 0 4 0  Anxiety Difficulty  Not difficult at all Somewhat difficult Not difficult at all       04/24/2024    8:17 AM 10/07/2023    9:25 AM 04/23/2023    8:18 AM  Depression screen PHQ 2/9  Decreased Interest 0 0 0  Down, Depressed, Hopeless 0 0 0  PHQ - 2 Score 0 0 0   Altered sleeping  0 0  Tired, decreased energy  0 0  Change in appetite  0 0  Feeling bad or failure about yourself   0 0  Trouble concentrating  0 0  Moving slowly or fidgety/restless  0 0  Suicidal thoughts  0 0  PHQ-9 Score  0  0   Difficult doing work/chores  Not difficult at all Not difficult at all     Data saved with a previous flowsheet row definition    BP Readings from Last 3 Encounters:  04/24/24 124/72  10/18/23 100/70  10/15/23 121/86    Physical Exam Vitals and nursing note reviewed.  Constitutional:      General: She is  not in acute distress.    Appearance: She is well-developed.  HENT:     Head: Normocephalic and atraumatic.     Right Ear: Tympanic membrane and ear canal normal.     Left Ear: Tympanic membrane and ear canal normal.     Nose:     Right Sinus: No maxillary sinus tenderness.     Left Sinus: No maxillary sinus tenderness.  Eyes:     General: No scleral icterus.       Right eye: No discharge.        Left eye: No discharge.     Conjunctiva/sclera: Conjunctivae normal.  Neck:     Thyroid : No thyromegaly.     Vascular: No carotid bruit.  Cardiovascular:     Rate and Rhythm: Normal rate and regular rhythm.     Pulses: Normal pulses.     Heart sounds: Normal heart sounds.  Pulmonary:     Effort: Pulmonary effort is normal. No respiratory distress.     Breath sounds: No wheezing.  Abdominal:     General: Bowel sounds are normal.     Palpations: Abdomen is soft.     Tenderness: There is no abdominal tenderness.  Musculoskeletal:     Cervical back: Normal range of motion. No erythema.     Right lower leg: No edema.     Left lower leg: No edema.  Lymphadenopathy:     Cervical: No cervical adenopathy.  Skin:    General: Skin is warm and dry.     Findings: No rash.  Neurological:     Mental Status: She is alert and oriented to person, place, and time.     Cranial Nerves: No cranial nerve deficit.     Sensory: No sensory deficit.      Deep Tendon Reflexes: Reflexes are normal and symmetric.  Psychiatric:        Attention and Perception: Attention normal.        Mood and Affect: Mood normal.     Wt Readings from Last 3 Encounters:  04/24/24 218 lb (98.9 kg)  10/18/23 210 lb (95.3 kg)  10/15/23 210 lb (95.3 kg)    BP 124/72   Pulse 72   Ht 5' 10 (1.778 m)   Wt 218 lb (98.9 kg)   SpO2 96%   BMI 31.28 kg/m   Assessment and Plan:  Problem List Items Addressed This Visit       Unprioritized   Mixed hyperlipidemia (Chronic)   Currently taking Zetia . Lab Results  Component Value Date   LDLCALC 106 (H) 04/27/2023         Relevant Orders   Lipid panel   Multinodular goiter (nontoxic)   Being followed by ENT.      Relevant Orders   TSH + free T4   History of lung cancer   S/p resection; most recent scan showed no recurrent or residual disease. After she completes her post surgery follow up with oncology, she will have annual lung cancer screening CTs.      Relevant Orders   CBC with Differential/Platelet   Prediabetes   Managed with diet only. Lab Results  Component Value Date   HGBA1C 5.5 10/07/2023         Relevant Orders   Comprehensive metabolic panel with GFR   Hemoglobin A1c   Other Visit Diagnoses       Annual physical exam    -  Primary   she declines all vaccines Colon referral done  Relevant Orders   CBC with Differential/Platelet   Comprehensive metabolic panel with GFR   Hemoglobin A1c   Lipid panel   TSH + free T4     Encounter for screening mammogram for breast cancer         Colon cancer screening       Relevant Orders   Ambulatory referral to Gastroenterology       No follow-ups on file.    Leita HILARIO Adie, MD [facility] [place]           [1]  Allergies Allergen Reactions   Covid-19 (Mrna) Vaccine Swelling   Amoxicillin Itching   Prednisone Other (See Comments)    Makes me crazy  [2]  Social  History Tobacco Use   Smoking status: Former    Current packs/day: 0.00    Average packs/day: 1 pack/day for 45.0 years (45.0 ttl pk-yrs)    Types: Cigarettes    Start date: 09/03/1977    Quit date: 09/04/2022    Years since quitting: 1.6   Smokeless tobacco: Never  Vaping Use   Vaping status: Never Used  Substance Use Topics   Alcohol use: Yes    Alcohol/week: 6.0 standard drinks of alcohol    Types: 4 Glasses of wine, 2 Standard drinks or equivalent per week   Drug use: No  [3]  No outpatient medications have been marked as taking for the 04/24/24 encounter (Office Visit) with Adie Leita DEL, MD.

## 2024-04-24 NOTE — Assessment & Plan Note (Signed)
 Managed with diet only. Lab Results  Component Value Date   HGBA1C 5.5 10/07/2023

## 2024-04-24 NOTE — Addendum Note (Signed)
 Addended by: LOVENIA JESUSA SAUNDERS on: 04/24/2024 08:49 AM   Modules accepted: Orders

## 2024-04-24 NOTE — Patient Instructions (Signed)
 Call [facility] to schedule your mammogram at [phone].

## 2024-04-24 NOTE — Assessment & Plan Note (Signed)
 S/p resection; most recent scan showed no recurrent or residual disease. After she completes her post surgery follow up with oncology, she will have annual lung cancer screening CTs.

## 2024-04-24 NOTE — Assessment & Plan Note (Signed)
 Being followed by ENT.

## 2024-04-24 NOTE — Assessment & Plan Note (Signed)
 Currently taking Zetia . Lab Results  Component Value Date   LDLCALC 106 (H) 04/27/2023

## 2024-04-25 ENCOUNTER — Encounter: Payer: Self-pay | Admitting: Internal Medicine

## 2024-04-25 ENCOUNTER — Ambulatory Visit: Payer: Self-pay | Admitting: Internal Medicine

## 2024-04-25 ENCOUNTER — Inpatient Hospital Stay: Attending: Internal Medicine | Admitting: Internal Medicine

## 2024-04-25 VITALS — BP 148/95 | HR 80 | Temp 98.8°F | Resp 16 | Ht 70.0 in | Wt 220.0 lb

## 2024-04-25 DIAGNOSIS — C3431 Malignant neoplasm of lower lobe, right bronchus or lung: Secondary | ICD-10-CM | POA: Insufficient documentation

## 2024-04-25 DIAGNOSIS — Z902 Acquired absence of lung [part of]: Secondary | ICD-10-CM | POA: Insufficient documentation

## 2024-04-25 DIAGNOSIS — Z87891 Personal history of nicotine dependence: Secondary | ICD-10-CM | POA: Diagnosis not present

## 2024-04-25 DIAGNOSIS — Z85118 Personal history of other malignant neoplasm of bronchus and lung: Secondary | ICD-10-CM

## 2024-04-25 LAB — LIPID PANEL
Chol/HDL Ratio: 2.8 ratio (ref 0.0–4.4)
Cholesterol, Total: 210 mg/dL — ABNORMAL HIGH (ref 100–199)
HDL: 74 mg/dL (ref >39)
LDL Chol Calc (NIH): 105 mg/dL — ABNORMAL HIGH (ref 0–99)
Triglycerides: 185 mg/dL — ABNORMAL HIGH (ref 0–149)
VLDL Cholesterol Cal: 31 mg/dL (ref 5–40)

## 2024-04-25 LAB — CBC WITH DIFFERENTIAL/PLATELET
Basophils Absolute: 0.1 x10E3/uL (ref 0.0–0.2)
Basos: 1 %
EOS (ABSOLUTE): 0.5 x10E3/uL — ABNORMAL HIGH (ref 0.0–0.4)
Eos: 5 %
Hematocrit: 42.6 % (ref 34.0–46.6)
Hemoglobin: 13.6 g/dL (ref 11.1–15.9)
Immature Grans (Abs): 0 x10E3/uL (ref 0.0–0.1)
Immature Granulocytes: 0 %
Lymphocytes Absolute: 2.4 x10E3/uL (ref 0.7–3.1)
Lymphs: 24 %
MCH: 31.3 pg (ref 26.6–33.0)
MCHC: 31.9 g/dL (ref 31.5–35.7)
MCV: 98 fL — ABNORMAL HIGH (ref 79–97)
Monocytes Absolute: 0.8 x10E3/uL (ref 0.1–0.9)
Monocytes: 8 %
Neutrophils Absolute: 6.1 x10E3/uL (ref 1.4–7.0)
Neutrophils: 62 %
Platelets: 348 x10E3/uL (ref 150–450)
RBC: 4.35 x10E6/uL (ref 3.77–5.28)
RDW: 11.6 % — ABNORMAL LOW (ref 11.7–15.4)
WBC: 9.9 x10E3/uL (ref 3.4–10.8)

## 2024-04-25 LAB — COMPREHENSIVE METABOLIC PANEL WITH GFR
ALT: 21 IU/L (ref 0–32)
AST: 20 IU/L (ref 0–40)
Albumin: 4.2 g/dL (ref 3.9–4.9)
Alkaline Phosphatase: 144 IU/L — ABNORMAL HIGH (ref 49–135)
BUN/Creatinine Ratio: 15 (ref 12–28)
BUN: 11 mg/dL (ref 8–27)
Bilirubin Total: 0.3 mg/dL (ref 0.0–1.2)
CO2: 24 mmol/L (ref 20–29)
Calcium: 9.6 mg/dL (ref 8.7–10.3)
Chloride: 105 mmol/L (ref 96–106)
Creatinine, Ser: 0.73 mg/dL (ref 0.57–1.00)
Globulin, Total: 2.1 g/dL (ref 1.5–4.5)
Glucose: 99 mg/dL (ref 70–99)
Potassium: 4.9 mmol/L (ref 3.5–5.2)
Sodium: 143 mmol/L (ref 134–144)
Total Protein: 6.3 g/dL (ref 6.0–8.5)
eGFR: 92 mL/min/1.73 (ref >59)

## 2024-04-25 LAB — HEMOGLOBIN A1C
Est. average glucose Bld gHb Est-mCnc: 120 mg/dL
Hgb A1c MFr Bld: 5.8 % — ABNORMAL HIGH (ref 4.8–5.6)

## 2024-04-25 LAB — TSH+FREE T4
Free T4: 1.02 ng/dL (ref 0.82–1.77)
TSH: 1.17 u[IU]/mL (ref 0.450–4.500)

## 2024-04-25 NOTE — Progress Notes (Signed)
 [facility] CONSULT NOTE  Patient Care Team: Justus Leita DEL, MD as PCP - General (Internal Medicine) Trudy Feil, MD as Referring Physician (Dermatology) Rolm Aleene PARAS, MD (Obstetrics and Gynecology) Edda Mt, MD (Otolaryngology) Verdene Gills, RN as Oncology Nurse Navigator Rennie Cindy SAUNDERS, MD as Consulting Physician (Oncology)  CHIEF COMPLAINTS/PURPOSE OF CONSULTATION: lung cancer  #  Oncology History Overview Note  FEB 2024- PET scan:1. The posterior right lower lobe pulmonary nodule of concern on recent lung cancer screening chest CT shows no hypermetabolism on PET imaging today. While reassuring, low grade or well differentiated neoplasm can be poorly FDG avid.   APRIL 2024-  [Dr.Dgyali]LUNG, RLL, FINE NEEDLE ASPIRATION:  - Adenocarcinoma  - See comment   B. LUNG, RLL, BRUSHING:  - Adenocarcinoma  - See comment   COMMENT:  Sufficient tissue for molecular testing is present.  Dr. Reed  reviewed the case and agrees with the above diagnosis.   LUNG: Resection  Synchronous Tumors: N/A Total Number of Primary Tumors: 1 Procedure: Lobectomy Specimen Laterality: Right Tumor Focality: Unifocal Tumor Site: Right lower lobe Tumor Size: 1.4 x 1.4 x 1.2 cm      Total Tumor Size: Same as above      Invasive Tumor Size (applies only to invasive nonmucinous adenocarcinoma with a lepidic           component): Same as above Histologic Type: Adenocarcinoma, predominantly acinar type with focal mucinous features and rare micropapillary features (less than 5%). Visceral Pleura Invasion: Not identified Direct Invasion of Adjacent Structures: N/A Lymphovascular Invasion: Not identified Margins: All margins negative      Closest Margin(s) to Invasive Carcinoma: Bronchial at 4 cm margin(s) Involved by Invasive Carcinoma: N/A      Margin Status for Non-Invasive Tumor: N/A Treatment Effect: No known presurgical therapy      Percentage  of Residual Viable Tumor: N/A Regional Lymph Nodes:      Number of Lymph Nodes Involved: 0                           Nodal Sites with Tumor: 0      Number of Lymph Nodes Examined: 4                      Nodal Sites Examined: 2 (hilar and station 7) Distant Metastasis:      Distant Site(s) Involved: N/A Pathologic Stage Classification (pTNM, AJCC 8th Edition): pT1b, pN0 Ancillary Studies: Can be performed on clinician request Representative Tumor Block: E1 Comment(s): None ([id number])    History of lung cancer  08/28/2022 Initial Diagnosis   Primary cancer of right lower lobe of lung   08/28/2022 Cancer Staging   Staging form: Lung, AJCC 8th Edition - Clinical: Stage IA2 (cT1b, cN0, cM0) - Signed by Rennie Cindy SAUNDERS, MD on 08/28/2022   Primary cancer of right lower lobe of lung (HCC)  04/25/2024 Initial Diagnosis   Primary cancer of right lower lobe of lung (HCC)   04/25/2024 Cancer Staging   Staging form: Lung, AJCC V9 - Clinical: Stage IA2 (cT1b, cN0, cM0) - Signed by Rennie Cindy SAUNDERS, MD on 04/25/2024      HISTORY OF PRESENTING ILLNESS: Patient ambulating-independently.  Alone.  Sharon [name] 63 y.o.  female history of smoking-currently quit with right lower lobe stage I lung cancer-without any adjuvant therapy is here for follow-up/ and review the results of the CT scan.  Discussed the use of  AI scribe software for clinical note transcription with the patient, who gave verbal consent to proceed.  History of Present Illness   Sharon [name] is a 63 year old female with stage I right lower lobe lung cancer, status post resection, who presents for routine oncology follow-up and surveillance imaging review.  She is 1.5 years post right lower lobe lung cancer resection for stage I disease. Surveillance CT imaging showed post-surgical changes and small right-sided pulmonary nodules that have not changed since prior imaging. Imaging did not show any pathologically enlarged  lymph nodes. She reports no cough, dyspnea, or chest pain.  A previously identified thyroid  nodule was biopsied, and the patient recalls being told it was benign. She has no new symptoms or concerns related to the thyroid . An adrenal gland lesion has been present since 2024, and the patient recalls that prior PET imaging did not show any abnormal findings. She has not experienced symptoms such as abdominal pain or hormonal changes.  Gallstones have been consistently noted on imaging, but she has not had biliary colic, abdominal pain, or vomiting. Coronary artery calcification was identified on prior imaging and evaluated by cardiology, and the patient recalls being told it was age-related and not concerning. She reports no chest pain, palpitations, or shortness of breath.  She quit smoking approximately 1.5 years ago. She does not report any new symptoms or concerns at this visit.     Review of Systems  Constitutional:  Negative for chills, diaphoresis, fever, malaise/fatigue and weight loss.  HENT:  Negative for nosebleeds and sore throat.   Eyes:  Negative for double vision.  Respiratory:  Negative for cough, hemoptysis, sputum production, shortness of breath and wheezing.   Cardiovascular:  Negative for chest pain, palpitations, orthopnea and leg swelling.  Gastrointestinal:  Negative for abdominal pain, blood in stool, constipation, diarrhea, heartburn, melena, nausea and vomiting.  Genitourinary:  Negative for dysuria, frequency and urgency.  Musculoskeletal:  Negative for back pain and joint pain.  Skin: Negative.  Negative for itching and rash.  Neurological:  Negative for dizziness, tingling, focal weakness, weakness and headaches.  Endo/Heme/Allergies:  Does not bruise/bleed easily.  Psychiatric/Behavioral:  Negative for depression. The patient is not nervous/anxious and does not have insomnia.      MEDICAL HISTORY:  Past Medical History:  Diagnosis Date   Benign neoplasm of  ascending colon    BRCA negative 09/2016   My Risk neg   Disease of nail 03/11/2015   Emphysema lung (HCC)    Encounter for nonprocreative genetic counseling    Family history of breast cancer    and pancreatic   Floppy mitral valve    told this long ago.  No issues   Headache    sinus, seasonal   Increased risk of breast cancer 09/2016   IBIS=17.0%/riskscore=22.4%   Muscle spasms of neck 03/11/2015   Numbness in feet    s/p foot and back surgery   Polyp of sigmoid colon    Polyp of transverse colon    PONV (postoperative nausea and vomiting)    Rosacea    Special screening for malignant neoplasms, colon    Tendinitis of wrist 03/11/2015   Tinnitus 12/2020    SURGICAL HISTORY: Past Surgical History:  Procedure Laterality Date   BRONCHIAL BIOPSY  08/21/2022   Procedure: BRONCHIAL BIOPSIES;  Surgeon: Isadora Hose, MD;  Location: [facility];  Service: Pulmonary;;   BRONCHIAL BRUSHINGS  08/21/2022   Procedure: BRONCHIAL BRUSHINGS;  Surgeon: Isadora Hose, MD;  Location: [facility];  Service: Pulmonary;;   BRONCHIAL NEEDLE ASPIRATION BIOPSY  08/21/2022   Procedure: BRONCHIAL NEEDLE ASPIRATION BIOPSIES;  Surgeon: Isadora Hose, MD;  Location: [facility];  Service: Pulmonary;;   BRONCHIAL WASHINGS  08/21/2022   Procedure: BRONCHIAL WASHINGS;  Surgeon: Isadora Hose, MD;  Location: [facility];  Service: Pulmonary;;   BUNIONECTOMY Bilateral    CESAREAN SECTION  1987   COLONOSCOPY WITH PROPOFOL  N/A 03/12/2016   Procedure: COLONOSCOPY WITH PROPOFOL ;  Surgeon: Rogelia Copping, MD;  Location: [facility];  Service: Endoscopy;  Laterality: N/A;   COLONOSCOPY WITH PROPOFOL  N/A 06/05/2019   Procedure: COLONOSCOPY WITH BIOPSIES;  Surgeon: Copping Rogelia, MD;  Location: [facility];  Service: Endoscopy;  Laterality: N/A;   INTERCOSTAL NERVE BLOCK Right 09/21/2022   Procedure: INTERCOSTAL NERVE BLOCK;  Surgeon: Shyrl Linnie KIDD, MD;  Location: [facility] OR;  Service:  Thoracic;  Laterality: Right;   LUMBAR DISC SURGERY     LUNG LOBECTOMY Right 10/2022   NODE DISSECTION Right 09/21/2022   Procedure: NODE DISSECTION;  Surgeon: Shyrl Linnie KIDD, MD;  Location: [facility] OR;  Service: Thoracic;  Laterality: Right;   POLYPECTOMY  03/12/2016   Procedure: POLYPECTOMY;  Surgeon: Rogelia Copping, MD;  Location: [facility];  Service: Endoscopy;;   POLYPECTOMY N/A 06/05/2019   Procedure: POLYPECTOMY;  Surgeon: Copping Rogelia, MD;  Location: [facility];  Service: Endoscopy;  Laterality: N/A;   VIDEO BRONCHOSCOPY WITH ENDOBRONCHIAL ULTRASOUND Right 08/21/2022   Procedure: VIDEO BRONCHOSCOPY WITH ENDOBRONCHIAL ULTRASOUND;  Surgeon: Isadora Hose, MD;  Location: [facility];  Service: Pulmonary;  Laterality: Right;    SOCIAL HISTORY: Social History   Socioeconomic History   Marital status: Married    Spouse name: [place]   Number of children: Not on file   Years of education: Not on file   Highest education level: Bachelor's degree (e.g., BA, AB, BS)  Occupational History   Not on file  Tobacco Use   Smoking status: Former    Current packs/day: 0.00    Average packs/day: 1 pack/day for 45.0 years (45.0 ttl pk-yrs)    Types: Cigarettes    Start date: 09/03/1977    Quit date: 09/04/2022    Years since quitting: 1.6   Smokeless tobacco: Never  Vaping Use   Vaping status: Never Used  Substance and Sexual Activity   Alcohol use: Yes    Alcohol/week: 6.0 standard drinks of alcohol    Types: 4 Glasses of wine, 2 Standard drinks or equivalent per week   Drug use: No   Sexual activity: Yes  Other Topics Concern   Not on file  Social History Narrative   Not on file   Social Drivers of Health   Tobacco Use: Medium Risk (04/25/2024)   Patient History    Smoking Tobacco Use: Former    Smokeless Tobacco Use: Never    Passive Exposure: Not on file  Financial Resource Strain: Low Risk (04/20/2024)   Overall Financial Resource Strain (CARDIA)     Difficulty of Paying Living Expenses: Not hard at all  Food Insecurity: No Food Insecurity (04/20/2024)   Epic    Worried About Radiation Protection Practitioner of Food in the Last Year: Never true    Ran Out of Food in the Last Year: Never true  Transportation Needs: No Transportation Needs (04/20/2024)   Epic    Lack of Transportation (Medical): No    Lack of Transportation (Non-Medical): No  Physical Activity: Insufficiently Active (04/20/2024)   Exercise Vital Sign    Days of  Exercise per Week: 1 day    Minutes of Exercise per Session: 20 min  Stress: No Stress Concern Present (04/20/2024)   [employer] of Occupational Health - Occupational Stress Questionnaire    Feeling of Stress: Not at all  Social Connections: Moderately Integrated (04/20/2024)   Social Connection and Isolation Panel    Frequency of Communication with Friends and Family: Three times a week    Frequency of Social Gatherings with Friends and Family: Once a week    Attends Religious Services: Never    Database Administrator or Organizations: Yes    Attends Engineer, Structural: More than 4 times per year    Marital Status: Married  Catering Manager Violence: Not on file  Depression (PHQ2-9): Low Risk (04/25/2024)   Depression (PHQ2-9)    PHQ-2 Score: 1  Alcohol Screen: Low Risk (04/20/2024)   Alcohol Screen    Last Alcohol Screening Score (AUDIT): 2  Housing: Low Risk (04/20/2024)   Epic    Unable to Pay for Housing in the Last Year: No    Number of Times Moved in the Last Year: 0    Homeless in the Last Year: No  Utilities: Not on file  Health Literacy: Not on file    FAMILY HISTORY: Family History  Problem Relation Age of Onset   COPD Mother    Pulmonary fibrosis Mother    Heart attack Father    Heart disease Father    Pancreatic cancer Maternal Uncle 34   Breast cancer Maternal Grandmother 60       twice, bilat breast--88   Breast cancer Other    Breast cancer Other    Ovarian cancer Other      ALLERGIES:  is allergic to covid-19 (mrna) vaccine, amoxicillin, and prednisone.  MEDICATIONS:  Current Outpatient Medications  Medication Sig Dispense Refill   acetaminophen  (TYLENOL ) 500 MG tablet Take 1,000 mg by mouth every 6 (six) hours as needed for moderate pain.     alum & mag hydroxide-simeth (MAALOX/MYLANTA) 200-200-20 MG/5ML suspension Take 30 mLs by mouth every 6 (six) hours as needed for indigestion or heartburn.     Cholecalciferol (VITAMIN D3) 125 MCG (5000 UT) capsule Take 5,000 Units by mouth daily.     Ciclopirox 1 % shampoo Apply 1 Application topically daily as needed (eczema on scalp).     cyanocobalamin (VITAMIN B12) 1000 MCG tablet Take 1,000 mcg by mouth daily.     diphenhydrAMINE  (BENADRYL ) 25 MG tablet Take 25 mg by mouth daily as needed for itching.     doxycycline (PERIOSTAT) 20 MG tablet Take 20-40 mg by mouth See admin instructions. Take 20 mg daily, increase to 40 mg as needed for rosacea flare     ezetimibe  (ZETIA ) 10 MG tablet TAKE 1 TABLET BY MOUTH EVERY DAY 90 tablet 3   fluocinolone (SYNALAR) 0.01 % external solution Apply 1 Application topically at bedtime as needed (eczema).     loratadine  (CLARITIN ) 10 MG tablet Take 10 mg by mouth daily.     Magnesium  Cl-Calcium Carbonate (SLOW MAGNESIUM /CALCIUM PO) Take 1 tablet by mouth daily.     montelukast  (SINGULAIR ) 10 MG tablet TAKE 1 TABLET BY MOUTH EVERYDAY AT BEDTIME 90 tablet 3   Multiple Vitamin (MULTIVITAMIN) capsule Take 1 capsule by mouth daily.     SKYRIZI PEN 150 MG/ML pen Inject 150 mg as directed once. Every 3 months     triamcinolone  (NASACORT ) 55 MCG/ACT AERO nasal inhaler Place 2 sprays into the  nose daily.     No current facility-administered medications for this visit.      PHYSICAL EXAMINATION:   Vitals:   04/25/24 1327 04/25/24 1420  BP: (!) 159/114 (!) 148/95  Pulse: 80   Resp: 16   Temp: 98.8 F (37.1 C)   SpO2: 97%       Filed Weights   04/25/24 1327  Weight: 220 lb  (99.8 kg)       Physical Exam Vitals and nursing note reviewed.  HENT:     Head: Normocephalic and atraumatic.     Mouth/Throat:     Pharynx: Oropharynx is clear.  Eyes:     Extraocular Movements: Extraocular movements intact.     Pupils: Pupils are equal, round, and reactive to light.  Cardiovascular:     Rate and Rhythm: Normal rate and regular rhythm.  Pulmonary:     Comments: Decreased breath sounds bilaterally.  Abdominal:     Palpations: Abdomen is soft.  Musculoskeletal:        General: Normal range of motion.     Cervical back: Normal range of motion.  Skin:    General: Skin is warm.  Neurological:     General: No focal deficit present.     Mental Status: She is alert and oriented to person, place, and time.  Psychiatric:        Behavior: Behavior normal.        Judgment: Judgment normal.      LABORATORY DATA:  I have reviewed the data as listed Lab Results  Component Value Date   WBC 9.9 04/24/2024   HGB 13.6 04/24/2024   HCT 42.6 04/24/2024   MCV 98 (H) 04/24/2024   PLT 348 04/24/2024   Recent Labs    04/24/24 0857  NA 143  K 4.9  CL 105  CO2 24  GLUCOSE 99  BUN 11  CREATININE 0.73  CALCIUM 9.6  PROT 6.3  ALBUMIN  4.2  AST 20  ALT 21  ALKPHOS 144*  BILITOT 0.3    RADIOGRAPHIC STUDIES: I have personally reviewed the radiological images as listed and agreed with the findings in the report. CT CHEST WO CONTRAST Result Date: 04/17/2024 CLINICAL DATA:  Lung cancer.  * Tracking Code: BO * EXAM: CT CHEST WITHOUT CONTRAST TECHNIQUE: Multidetector CT imaging of the chest was performed following the standard protocol without IV contrast. RADIATION DOSE REDUCTION: This exam was performed according to the departmental dose-optimization program which includes automated exposure control, adjustment of the mA and/or kV according to patient size and/or use of iterative reconstruction technique. COMPARISON:  09/29/2023. FINDINGS: Cardiovascular:  Atherosclerotic calcification of the aorta, aortic valve and coronary arteries. Heart size normal. No pericardial effusion. Mediastinum/Nodes: Mildly heterogeneous and enlarged thyroid . Low-attenuation right thyroid  nodule measures approximately 2.2 cm. Previous ultrasound 11/20/2019 with biopsy 11/28/2019. No follow-up recommended unless clinically warranted. (Ref: J Am Coll Radiol. 2015 Feb;12(2): 143-50).No pathologically enlarged mediastinal or axillary lymph nodes. Hilar regions are difficult to definitively evaluate without IV contrast. Esophagus is grossly unremarkable. Lungs/Pleura: Centrilobular emphysema. Right lower lobectomy. Scattered small subpleural nodules are unchanged. No suspicious pulmonary nodules. No pleural fluid. Debris in the bronchus intermedius and left mainstem bronchus. Upper Abdomen: Gallstones. Right adrenal adenoma. 1.7 cm left adrenal nodule measures 32 Hounsfield units but is present dating back to at least 07/08/2022 PET at which time there was no associated hypermetabolism. No specific follow-up necessary. Small hiatal hernia. Visualized portions of the liver, gallbladder, adrenal glands, kidneys, spleen, pancreas, stomach and  bowel are otherwise grossly unremarkable. No upper abdominal adenopathy. Musculoskeletal: Degenerative changes in the spine. No worrisome lytic or sclerotic lesions. Probable bone island in the T6 vertebral body. IMPRESSION: 1. Right lower lobectomy. No evidence of recurrent or metastatic disease. 2. Gallstones. 3. Right adrenal adenoma. Stable left adrenal nodule, also likely an adenoma. 4. Aortic atherosclerosis (ICD10-I70.0). Coronary artery calcification. 5.  Emphysema (ICD10-J43.9). Electronically Signed   By: Newell Eke M.D.   On: 04/17/2024 16:28    ASSESSMENT & PLAN:   Primary cancer of right lower lobe of lung (HCC) # Adenocarcinoma right lower lobe-clinical stage I [s/p Bronch- Dr.Dgyali]. MAY 13th, 2024- s/p robotic right lobectomy-   pT1b, pN0. SABRA.E. LUNG, RIGHT LOWER LOBE, LOBECTOMY: -  Adenocarcinoma, predominantly acinar type with focal micropapillary pattern (less than  5%) and focal mucinous features. -  1.4 x 1.4 x 1.2 cm. -  Margins negative.-  Negative for lymphovascular and pleural invasion.  pT1b pN0. NO ROLE FOR ADJUVANT THERAPY.  Currently on surveillance.  # CT scan chest contrast- NOV 2025-   Status post right lower lobectomy without evidence of recurrent or residual disease.  STABLE> Continue surveillance imaging with CT scan every 6 months for the first 2-3 years and then follow-up imaging on an annual basis. CT scan in 6 months- there after q 12 m.   # Smoking: Congratulated the patient on quitting smoking-April 2024.  #Incidental findings on Imaging  CT , 2025 Moderate emphysema; Cholelithiasis w;  Stable 2.6 cm benign right adrenal adenoma; thyroid  nodule s/p biopsy negative aortic Atherosclerosis [sp evaluation with cards]; I reviewed/discussed/counseled the patient.   Non-contrast CT # DISPOSITION: # Follow up in 6 months- MD; no - labs- Non-contrast CT chest prior-  Dr.B  # I reviewed the blood work- with the patient in detail; also reviewed the imaging independently [as summarized above]; and with the patient in detail.   # 25 minutes face-to-face with the patient discussing the above plan of care; more than 50% of time spent on prognosis/ natural history; counseling and coordination.      All questions were answered. The patient knows to call the clinic with any problems, questions or concerns.    Cindy JONELLE Joe, MD 04/25/2024 3:06 PM

## 2024-04-25 NOTE — Assessment & Plan Note (Addendum)
#   Adenocarcinoma right lower lobe-clinical stage I [s/p Bronch- Dr.Dgyali]. MAY 13th, 2024- s/p robotic right lobectomy-  pT1b, pN0. SABRA.E. LUNG, RIGHT LOWER LOBE, LOBECTOMY: -  Adenocarcinoma, predominantly acinar type with focal micropapillary pattern (less than  5%) and focal mucinous features. -  1.4 x 1.4 x 1.2 cm. -  Margins negative.-  Negative for lymphovascular and pleural invasion.  pT1b pN0. NO ROLE FOR ADJUVANT THERAPY.  Currently on surveillance.  # CT scan chest contrast- NOV 2025-   Status post right lower lobectomy without evidence of recurrent or residual disease.  STABLE> Continue surveillance imaging with CT scan every 6 months for the first 2-3 years and then follow-up imaging on an annual basis. CT scan in 6 months- there after q 12 m.   # Smoking: Congratulated the patient on quitting smoking-April 2024.  #Incidental findings on Imaging  CT , 2025 Moderate emphysema; Cholelithiasis w;  Stable 2.6 cm benign right adrenal adenoma; thyroid  nodule s/p biopsy negative aortic Atherosclerosis [sp evaluation with cards]; I reviewed/discussed/counseled the patient.   Non-contrast CT # DISPOSITION: # Follow up in 6 months- MD; no - labs- Non-contrast CT chest prior-  Dr.B  # I reviewed the blood work- with the patient in detail; also reviewed the imaging independently [as summarized above]; and with the patient in detail.   # 25 minutes face-to-face with the patient discussing the above plan of care; more than 50% of time spent on prognosis/ natural history; counseling and coordination.

## 2024-04-25 NOTE — Progress Notes (Signed)
 CT chest 06/02/23.

## 2024-04-25 NOTE — Assessment & Plan Note (Deleted)
#   Adenocarcinoma right lower lobe-clinical stage I [s/p Bronch- Dr.Dgyali]. MAY 13th, 2024- s/p robotic right lobectomy-  pT1b, pN0. Aaron Aas.E. LUNG, RIGHT LOWER LOBE, LOBECTOMY: -  Adenocarcinoma, predominantly acinar type with focal micropapillary pattern (less than  5%) and focal mucinous features. -  1.4 x 1.4 x 1.2 cm. -  Margins negative.-  Negative for lymphovascular and pleural invasion.  pT1b pN0. NO ROLE FOR ADJUVANT THERAPY.  Currently on surveillance.  # CT scan chest contrast- MAY 21st, 2025-  Status post right lower lobectomy without evidence of recurrent or residual disease.  STABLE> Continue surveillance imaging with CT scan every 6 months for the first 2-3 years and then follow-up imaging on an annual basis.  # Smoking: Congratulated the patient on quitting smoking-April 2024.  #Incidental findings on Imaging  CT , 2025 Moderate emphysema; Cholelithiasis with impacted 2.2 cm calculus within the gallbladder neck without superimposed pericholecystic inflammatory change. Stable 2.6 cm benign right adrenal adenoma; Aortic Atherosclerosis [sp evaluation with cards]; I reviewed/discussed/counseled the patient.   Non-contrast CT # DISPOSITION: # Follow up in 6 months- MD; no - labs- Non-contrast CT chest prior-  Dr.B  # I reviewed the blood work- with the patient in detail; also reviewed the imaging independently [as summarized above]; and with the patient in detail.

## 2024-04-28 ENCOUNTER — Telehealth: Payer: Self-pay

## 2024-04-28 ENCOUNTER — Other Ambulatory Visit: Payer: Self-pay

## 2024-04-28 DIAGNOSIS — Z860100 Personal history of colon polyps, unspecified: Secondary | ICD-10-CM

## 2024-04-28 MED ORDER — NA SULFATE-K SULFATE-MG SULF 17.5-3.13-1.6 GM/177ML PO SOLN: 354.0000 mL | Freq: Once | ORAL | 0 refills | Status: AC

## 2024-04-28 NOTE — Telephone Encounter (Signed)
 Gastroenterology Pre-Procedure Review  Request Date: 06/09/2024 Requesting Physician: Dr. Melany  PATIENT REVIEW QUESTIONS: The patient responded to the following health history questions as indicated:    1. Are you having any GI issues? no 2. Do you have a personal history of Polyps? yes (polyps Dr. Jinny 06/05/2019) 3. Do you have a family history of Colon Cancer or Polyps? no 4. Diabetes Mellitus? no 5. Joint replacements in the past 12 months?no 6. Major health problems in the past 3 months?no 7. Any artificial heart valves, MVP, or defibrillator?no    MEDICATIONS & ALLERGIES:    Patient reports the following regarding taking any anticoagulation/antiplatelet therapy:   Plavix, Coumadin, Eliquis, Xarelto, Lovenox , Pradaxa, Brilinta, or Effient? no Aspirin? no  Patient confirms/reports the following medications:  Current Outpatient Medications  Medication Sig Dispense Refill   acetaminophen  (TYLENOL ) 500 MG tablet Take 1,000 mg by mouth every 6 (six) hours as needed for moderate pain.     alum & mag hydroxide-simeth (MAALOX/MYLANTA) 200-200-20 MG/5ML suspension Take 30 mLs by mouth every 6 (six) hours as needed for indigestion or heartburn.     Cholecalciferol (VITAMIN D3) 125 MCG (5000 UT) capsule Take 5,000 Units by mouth daily.     Ciclopirox 1 % shampoo Apply 1 Application topically daily as needed (eczema on scalp).     cyanocobalamin (VITAMIN B12) 1000 MCG tablet Take 1,000 mcg by mouth daily.     diphenhydrAMINE  (BENADRYL ) 25 MG tablet Take 25 mg by mouth daily as needed for itching.     doxycycline (PERIOSTAT) 20 MG tablet Take 20-40 mg by mouth See admin instructions. Take 20 mg daily, increase to 40 mg as needed for rosacea flare     ezetimibe  (ZETIA ) 10 MG tablet TAKE 1 TABLET BY MOUTH EVERY DAY 90 tablet 3   fluocinolone (SYNALAR) 0.01 % external solution Apply 1 Application topically at bedtime as needed (eczema).     loratadine  (CLARITIN ) 10 MG tablet Take 10 mg by  mouth daily.     Magnesium  Cl-Calcium Carbonate (SLOW MAGNESIUM /CALCIUM PO) Take 1 tablet by mouth daily.     montelukast  (SINGULAIR ) 10 MG tablet TAKE 1 TABLET BY MOUTH EVERYDAY AT BEDTIME 90 tablet 3   Multiple Vitamin (MULTIVITAMIN) capsule Take 1 capsule by mouth daily.     Na Sulfate-K Sulfate-Mg Sulfate concentrate (SUPREP) 17.5-3.13-1.6 GM/177ML SOLN Take 1 kit (354 mLs total) by mouth once for 1 dose. Starting at 5 PM take one bottle and pour into the supplied cup, add cool water  to the fill 16 oz line and drink all. Then 5 hours before procedure pour the second bottle into the supplied cup, add cool water  to the fill 16 oz line and drink all. 354 mL 0   SKYRIZI PEN 150 MG/ML pen Inject 150 mg as directed once. Every 3 months     triamcinolone  (NASACORT ) 55 MCG/ACT AERO nasal inhaler Place 2 sprays into the nose daily.     No current facility-administered medications for this visit.    Patient confirms/reports the following allergies:  Allergies[1]  No orders of the defined types were placed in this encounter.   AUTHORIZATION INFORMATION Primary Insurance: 1D#: Group #:  Secondary Insurance: 1D#: Group #:  SCHEDULE INFORMATION: Date: 06/09/2024 Time: Location: [facility] Dr. Melany    [1]  Allergies Allergen Reactions   Covid-19 (Mrna) Vaccine Swelling   Amoxicillin Itching   Prednisone Other (See Comments)    Makes me crazy

## 2024-05-03 ENCOUNTER — Telehealth: Payer: Self-pay

## 2024-05-03 NOTE — Telephone Encounter (Signed)
 Patient called stating that she wanted to reschedule her colonoscopy to another day. I then called her back and I had to leave her a voicemail to call me back.

## 2024-05-08 NOTE — Telephone Encounter (Signed)
 Patient message was sent today and hope she calls us  back to reschedule her colonoscopy.

## 2024-05-22 NOTE — Telephone Encounter (Signed)
 FYI  KP

## 2024-05-24 ENCOUNTER — Other Ambulatory Visit: Payer: Self-pay | Admitting: Cardiovascular Disease

## 2024-05-25 NOTE — Telephone Encounter (Signed)
 In accordance with refill protocols, please review and address the following requirements before this medication refill can be authorized:  Labs

## 2024-05-26 ENCOUNTER — Other Ambulatory Visit: Payer: Self-pay

## 2024-05-26 ENCOUNTER — Other Ambulatory Visit: Payer: Self-pay | Admitting: Internal Medicine

## 2024-05-26 DIAGNOSIS — J3089 Other allergic rhinitis: Secondary | ICD-10-CM

## 2024-05-26 MED ORDER — EZETIMIBE 10 MG PO TABS: 10.0000 mg | ORAL_TABLET | Freq: Every day | ORAL | 0 refills | Status: AC

## 2024-05-26 MED ORDER — MONTELUKAST SODIUM 10 MG PO TABS: ORAL_TABLET | ORAL | 3 refills | Status: DC

## 2024-05-26 MED ORDER — MONTELUKAST SODIUM 10 MG PO TABS: ORAL_TABLET | ORAL | 3 refills | Status: AC

## 2024-05-26 NOTE — Telephone Encounter (Signed)
 Copied from CRM [id number]. Topic: Clinical - Prescription Issue >> May 26, 2024  3:30 PM Joesph B wrote: Reason for CRM: patient would like [facility] - montelukast  (SINGULAIR ) 10 MG tablet   Se states it was sent to [facility] and she is changing her pharmacy. Please resend.

## 2024-05-26 NOTE — Telephone Encounter (Signed)
 Requested Prescriptions  Pending Prescriptions Disp Refills   montelukast  (SINGULAIR ) 10 MG tablet 90 tablet 3    Sig: TAKE 1 TABLET BY MOUTH EVERYDAY AT BEDTIME     Pulmonology:  Leukotriene Inhibitors Passed - 05/26/2024  3:53 PM      Passed - Valid encounter within last 12 months    Recent Outpatient Visits           1 month ago Annual physical exam   [facility], Leita DEL, MD   7 months ago Mixed hyperlipidemia   [facility], Leita DEL, MD

## 2024-06-12 ENCOUNTER — Ambulatory Visit

## 2024-07-10 ENCOUNTER — Ambulatory Visit: Admit: 2024-07-10 | Payer: Self-pay | Admitting: Gastroenterology

## 2024-07-10 SURGERY — COLONOSCOPY
Anesthesia: General

## 2024-07-11 ENCOUNTER — Ambulatory Visit: Payer: Self-pay

## 2024-10-11 ENCOUNTER — Ambulatory Visit: Admitting: Family Medicine

## 2024-10-20 ENCOUNTER — Other Ambulatory Visit

## 2024-10-27 ENCOUNTER — Inpatient Hospital Stay: Admitting: Internal Medicine
